# Patient Record
Sex: Male | Born: 1970 | Race: White | Hispanic: No | Smoking: Never smoker
Health system: Southern US, Community
[De-identification: ages and names within clinical notes are randomized; demographics above are authoritative.]

## PROBLEM LIST (undated history)

## (undated) DIAGNOSIS — E785 Hyperlipidemia, unspecified: Secondary | ICD-10-CM

## (undated) DIAGNOSIS — F419 Anxiety disorder, unspecified: Secondary | ICD-10-CM

## (undated) DIAGNOSIS — I10 Essential (primary) hypertension: Secondary | ICD-10-CM

## (undated) DIAGNOSIS — R42 Dizziness and giddiness: Secondary | ICD-10-CM

## (undated) DIAGNOSIS — F32A Depression, unspecified: Secondary | ICD-10-CM

## (undated) DIAGNOSIS — M1990 Unspecified osteoarthritis, unspecified site: Secondary | ICD-10-CM

## (undated) DIAGNOSIS — E119 Type 2 diabetes mellitus without complications: Secondary | ICD-10-CM

## (undated) DIAGNOSIS — G4730 Sleep apnea, unspecified: Secondary | ICD-10-CM

## (undated) HISTORY — DX: Essential (primary) hypertension: I10

## (undated) HISTORY — DX: Depression, unspecified: F32.A

## (undated) HISTORY — PX: GALLBLADDER SURGERY: SHX652

## (undated) HISTORY — DX: Hyperlipidemia, unspecified: E78.5

## (undated) HISTORY — PX: KNEE SURGERY: SHX244

## (undated) HISTORY — DX: Unspecified osteoarthritis, unspecified site: M19.90

## (undated) HISTORY — DX: Sleep apnea, unspecified: G47.30

---

## 2020-08-07 ENCOUNTER — Encounter (HOSPITAL_COMMUNITY): Payer: Self-pay

## 2020-08-07 ENCOUNTER — Other Ambulatory Visit: Payer: Self-pay

## 2020-08-07 ENCOUNTER — Emergency Department (HOSPITAL_COMMUNITY)
Admission: EM | Admit: 2020-08-07 | Discharge: 2020-08-07 | Disposition: A | Discharge status: Home / Self Care | Payer: No Typology Code available for payment source | Attending: Emergency Medicine | Admitting: Emergency Medicine

## 2020-08-07 ENCOUNTER — Emergency Department (HOSPITAL_COMMUNITY): Payer: No Typology Code available for payment source

## 2020-08-07 DIAGNOSIS — Z20822 Contact with and (suspected) exposure to covid-19: Secondary | ICD-10-CM | POA: Diagnosis not present

## 2020-08-07 DIAGNOSIS — K5900 Constipation, unspecified: Secondary | ICD-10-CM

## 2020-08-07 DIAGNOSIS — N289 Disorder of kidney and ureter, unspecified: Secondary | ICD-10-CM | POA: Insufficient documentation

## 2020-08-07 DIAGNOSIS — R1084 Generalized abdominal pain: Secondary | ICD-10-CM

## 2020-08-07 DIAGNOSIS — R739 Hyperglycemia, unspecified: Secondary | ICD-10-CM

## 2020-08-07 DIAGNOSIS — E1165 Type 2 diabetes mellitus with hyperglycemia: Secondary | ICD-10-CM | POA: Diagnosis not present

## 2020-08-07 HISTORY — DX: Anxiety disorder, unspecified: F41.9

## 2020-08-07 HISTORY — DX: Dizziness and giddiness: R42

## 2020-08-07 HISTORY — DX: Type 2 diabetes mellitus without complications: E11.9

## 2020-08-07 LAB — COMPREHENSIVE METABOLIC PANEL
ALT: 23 U/L (ref 0–44)
AST: 14 U/L — ABNORMAL LOW (ref 15–41)
Albumin: 4 g/dL (ref 3.5–5.0)
Alkaline Phosphatase: 74 U/L (ref 38–126)
Anion gap: 9 (ref 5–15)
BUN: 22 mg/dL — ABNORMAL HIGH (ref 6–20)
CO2: 26 mmol/L (ref 22–32)
Calcium: 8.7 mg/dL — ABNORMAL LOW (ref 8.9–10.3)
Chloride: 98 mmol/L (ref 98–111)
Creatinine, Ser: 1.39 mg/dL — ABNORMAL HIGH (ref 0.61–1.24)
GFR, Estimated: >60 mL/min (ref >60)
Glucose, Bld: 401 mg/dL — ABNORMAL HIGH (ref 70–99)
Potassium: 4.8 mmol/L (ref 3.5–5.1)
Sodium: 133 mmol/L — ABNORMAL LOW (ref 135–145)
Total Bilirubin: 0.7 mg/dL (ref 0.3–1.2)
Total Protein: 7 g/dL (ref 6.5–8.1)

## 2020-08-07 LAB — CBC
HCT: 44.7 % (ref 39.0–52.0)
Hemoglobin: 14.8 g/dL (ref 13.0–17.0)
MCH: 28.6 pg (ref 26.0–34.0)
MCHC: 33.1 g/dL (ref 30.0–36.0)
MCV: 86.5 fL (ref 80.0–100.0)
Platelets: 160 10*3/uL (ref 150–400)
RBC: 5.17 MIL/uL (ref 4.22–5.81)
RDW: 12.2 % (ref 11.5–15.5)
WBC: 8.6 10*3/uL (ref 4.0–10.5)
nRBC: 0 % (ref 0.0–0.2)

## 2020-08-07 LAB — URINALYSIS, ROUTINE W REFLEX MICROSCOPIC
Bilirubin Urine: NEGATIVE
Glucose, UA: >=500 mg/dL — AB
Hgb urine dipstick: NEGATIVE
Ketones, ur: 5 mg/dL — AB
Leukocytes,Ua: NEGATIVE
Nitrite: NEGATIVE
Protein, ur: NEGATIVE mg/dL
Specific Gravity, Urine: 1.025 (ref 1.005–1.030)
pH: 7 (ref 5.0–8.0)

## 2020-08-07 LAB — LIPASE, BLOOD: Lipase: 42 U/L (ref 11–51)

## 2020-08-07 LAB — RESP PANEL BY RT-PCR (FLU A&B, COVID) ARPGX2
Influenza A by PCR: NEGATIVE
Influenza B by PCR: NEGATIVE
SARS Coronavirus 2 by RT PCR: NEGATIVE

## 2020-08-07 MED ORDER — FENTANYL CITRATE (PF) 100 MCG/2ML IJ SOLN
100.0000 ug | INTRAMUSCULAR | Status: DC | PRN
  Administered 2020-08-07: 100 ug via INTRAVENOUS
  Filled 2020-08-07: qty 2

## 2020-08-07 MED ORDER — HYDROCODONE-ACETAMINOPHEN 5-325 MG PO TABS: 1.0000 | ORAL_TABLET | ORAL | 0 refills | Status: DC | PRN

## 2020-08-07 MED ORDER — IOHEXOL 300 MG/ML  SOLN
100.0000 mL | Freq: Once | INTRAMUSCULAR | Status: AC | PRN
  Administered 2020-08-07: 100 mL via INTRAVENOUS

## 2020-08-07 MED ORDER — ONDANSETRON HCL 4 MG/2ML IJ SOLN
4.0000 mg | Freq: Once | INTRAMUSCULAR | Status: AC
  Administered 2020-08-07: 08:00:00 4 mg via INTRAVENOUS
  Filled 2020-08-07: qty 2

## 2020-08-07 MED ORDER — HYDROCODONE-ACETAMINOPHEN 5-325 MG PO TABS: 2.0000 | ORAL_TABLET | ORAL | 0 refills | Status: DC | PRN

## 2020-08-07 MED ORDER — SODIUM CHLORIDE 0.9 % IV BOLUS
1000.0000 mL | Freq: Once | INTRAVENOUS | Status: AC
  Administered 2020-08-07: 08:00:00 1000 mL via INTRAVENOUS

## 2020-08-07 NOTE — ED Triage Notes (Signed)
Pt presents to ED with complaints of right upper abdominal pain started at midnight, nausea and vomiting. Pt states he has had an episode like this before and told it was his gallbladder.

## 2020-08-07 NOTE — Discharge Instructions (Addendum)
Your abdominal pain is likely secondary to constipation.  Try to increase amount of fiber in your diet to help improve stooling.  Also, get a bottle of magnesium citrate, and drink it today.  Additionally, use MiraLAX, stool softener, twice a day for 3 weeks.  This can help improve your stooling.  Make sure you are drinking plenty of fluids.  Sometimes soaking in a bathtub helps.  Try to get some regular daily exercise to improve your bowel habits.  The testing showed that your blood sugar was high and your kidney function is not optimal.  You likely need to be treated for diabetes.  Use the attached resource guide to help you find a doctor to see for further care and treatment of diabetes and kidney problems.  This should be done within the next 2 or 3 weeks.

## 2020-08-07 NOTE — ED Provider Notes (Signed)
[facility] EMERGENCY DEPARTMENT Provider Note   CSN: [id number] Arrival date & time: 08/07/20  7124     History Chief Complaint  Patient presents with  . Abdominal Pain    Gary Thompson is a 49 y.o. male.  HPI Reports onset of diffuse abdominal pain yesterday with a cramping sensation, nausea and vomiting.  He denies fever, blood in emesis, constipation or diarrhea.  Similar pain in the past when he had "a gallbladder problem."  He has never had abdominal surgery.  He denies other recent illnesses.  He has not had a Covid vaccine.  There are no other known modifying factors.    Past Medical History:  Diagnosis Date  . Anxiety   . Diabetes mellitus without complication ([place])   . Vertigo     There are no problems to display for this patient.   Past Surgical History:  Procedure Laterality Date  . KNEE SURGERY         No family history on file.  Social History   Tobacco Use  . Smoking status: Never Smoker  . Smokeless tobacco: Never Used  Substance Use Topics  . Alcohol use: Yes    Comment: occ  . Drug use: Not Currently    Home Medications Prior to Admission medications   Medication Sig Start Date End Date Taking? Authorizing Provider  HYDROcodone-acetaminophen (NORCO) 5-325 MG tablet Take 1 tablet by mouth every 4 (four) hours as needed for moderate pain or severe pain. 08/07/20   Daleen Bo, MD    Allergies    Vicks dayquil-nyquil cld & flu [pe-dm-apap & doxylamin-dm-apap]  Review of Systems   Review of Systems  All other systems reviewed and are negative.   Physical Exam Updated Vital Signs BP (!) 172/78   Pulse (!) 57   Temp 97.8 F (36.6 C) (Oral)   Resp 18   Ht 5\' 6"  (1.676 m)   Wt 127 kg   SpO2 98%   BMI 45.19 kg/m   Physical Exam Vitals and nursing note reviewed.  Constitutional:      General: He is in acute distress (Uncomfortable).     Appearance: He is well-developed and well-nourished. He is ill-appearing. He is not  toxic-appearing or diaphoretic.  HENT:     Head: Normocephalic and atraumatic.     Right Ear: External ear normal.     Left Ear: External ear normal.  Eyes:     Extraocular Movements: EOM normal.     Conjunctiva/sclera: Conjunctivae normal.     Pupils: Pupils are equal, round, and reactive to light.  Neck:     Trachea: Phonation normal.  Cardiovascular:     Rate and Rhythm: Normal rate and regular rhythm.     Heart sounds: Normal heart sounds.  Pulmonary:     Effort: Pulmonary effort is normal.     Breath sounds: Normal breath sounds.  Chest:     Chest wall: No bony tenderness.  Abdominal:     General: There is no distension.     Palpations: Abdomen is soft. There is no mass.     Tenderness: There is abdominal tenderness (Diffuse, mild). There is no guarding.     Hernia: No hernia is present.     Comments: Hyperactive bowel sounds  Musculoskeletal:        General: Normal range of motion.     Cervical back: Normal range of motion and neck supple.  Skin:    General: Skin is warm, dry and intact.  Neurological:  Mental Status: He is alert and oriented to person, place, and time.     Cranial Nerves: No cranial nerve deficit.     Sensory: No sensory deficit.     Motor: No abnormal muscle tone.     Coordination: Coordination normal.  Psychiatric:        Mood and Affect: Mood and affect and mood normal.        Behavior: Behavior normal.        Thought Content: Thought content normal.        Judgment: Judgment normal.     ED Results / Procedures / Treatments   Labs (all labs ordered are listed, but only abnormal results are displayed) Labs Reviewed  COMPREHENSIVE METABOLIC PANEL - Abnormal; Notable for the following components:      Result Value   Sodium 133 (*)    Glucose, Bld 401 (*)    BUN 22 (*)    Creatinine, Ser 1.39 (*)    Calcium 8.7 (*)    AST 14 (*)    All other components within normal limits  URINALYSIS, ROUTINE W REFLEX MICROSCOPIC - Abnormal; Notable  for the following components:   Color, Urine STRAW (*)    Glucose, UA >=500 (*)    Ketones, ur 5 (*)    Bacteria, UA RARE (*)    All other components within normal limits  RESP PANEL BY RT-PCR (FLU A&B, COVID) ARPGX2  LIPASE, BLOOD  CBC    EKG None  Radiology CT Abdomen Pelvis W Contrast  Result Date: 08/07/2020 CLINICAL DATA:  Concern for abscess or infection. Nausea and vomiting. Upper abdominal pain. EXAM: CT ABDOMEN AND PELVIS WITH CONTRAST TECHNIQUE: Multidetector CT imaging of the abdomen and pelvis was performed using the standard protocol following bolus administration of intravenous contrast. CONTRAST:  168mL OMNIPAQUE IOHEXOL 300 MG/ML  SOLN COMPARISON:  None. FINDINGS: Lower chest: Lung bases are clear. Hepatobiliary: No focal hepatic lesion. No biliary duct dilatation. Common bile duct is normal. Pancreas: Pancreas is normal. No ductal dilatation. No pancreatic inflammation. Spleen: Normal spleen Adrenals/urinary tract: Adrenal glands and kidneys are normal. The ureters and bladder normal. Stomach/Bowel: Stomach, small bowel, appendix, and cecum are normal. The colon and rectosigmoid colon are normal. Vascular/Lymphatic: Abdominal aorta is normal caliber. No periportal or retroperitoneal adenopathy. No pelvic adenopathy. Reproductive: Prostate unremarkable Other: No free fluid. Fat filled umbilical hernia measures 4.6 cm. (Image 59/2) Musculoskeletal: No aggressive osseous lesion. IMPRESSION: 1. No acute findings in the abdomen pelvis. 2. No abscess or inflammation identified. 3. Moderate size fat filled umbilical hernia Electronically Signed   By: Suzy Bouchard M.D.   On: 08/07/2020 09:46    Procedures Procedures (including critical care time)  Medications Ordered in ED Medications  fentaNYL (SUBLIMAZE) injection 100 mcg (100 mcg Intravenous Given 08/07/20 0813)  sodium chloride 0.9 % bolus 1,000 mL (0 mLs Intravenous Stopped 08/07/20 0845)  ondansetron (ZOFRAN) injection  4 mg (4 mg Intravenous Given 08/07/20 0813)  iohexol (OMNIPAQUE) 300 MG/ML solution 100 mL (100 mLs Intravenous Contrast Given 08/07/20 1950)    ED Course  I have reviewed the triage vital signs and the nursing notes.  Pertinent labs & imaging results that were available during my care of the patient were reviewed by me and considered in my medical decision making (see chart for details).    MDM Rules/Calculators/A&P  Patient Vitals for the past 24 hrs:  BP Temp Temp src Pulse Resp SpO2 Height Weight  08/07/20 1000 (!) 172/78 -- -- (!) 57 18 98 % -- --  08/07/20 0739 (!) 172/72 97.8 F (36.6 C) Oral 70 18 98 % -- --  08/07/20 0735 -- -- -- -- -- -- 5\' 6"  (1.676 m) 127 kg    10:36 AM Reevaluation with update and discussion. After initial assessment and treatment, an updated evaluation reveals patient complains of persistent abdominal pain, now below the umbilicus.  He states that he has been constipated recently.  He does not currently take a stool softener.  Discussed findings with the patient, and wife in the room, all questions were answered. Daleen Bo   Medical Decision Making:  This patient is presenting for evaluation of abdominal pain, which does require a range of treatment options, and is a complaint that involves a high risk of morbidity and mortality. The differential diagnoses include intestinal disorder, gallbladder disorder, acute illness, metabolic disorder. I decided to review old records, and in summary obese middle-aged male presenting with abdominal pain, acute in onset.  I did not require additional historical information from anyone.  Clinical Laboratory Tests Ordered, included CBC, Metabolic panel, Urinalysis and Covid test. Review indicates high blood sugar, mild renal insufficiency, remainder normal. Radiologic Tests Ordered, included CT abdomen pelvis.  I independently Visualized: CT images, which show no acute  abnormality    Critical Interventions-clinical evaluation, laboratory testing, CT imaging, observation and reassessment  After These Interventions, the Patient was reevaluated and was found stable for discharge.  No acute intra-abdominal abnormalities.  Incidental hyperglycemia, and mild renal insufficiency.  Patient appears to have untreated diabetes.  No metabolic crisis, or suspected acute infectious process.  Hemodynamically stable.  He has mild hypertension, which may be secondary to his pain.  He will be referred to PCP for ongoing management.  CRITICAL CARE-no Performed by: Daleen Bo  Nursing Notes Reviewed/ Care Coordinated Applicable Imaging Reviewed Interpretation of Laboratory Data incorporated into ED treatment  The patient appears reasonably screened and/or stabilized for discharge and I doubt any other medical condition or other [facility] requiring further screening, evaluation, or treatment in the ED at this time prior to discharge.  Plan: Home Medications-OTC stool softener; Home Treatments-fluids, fiber, low-carb diet; return here if the recommended treatment, does not improve the symptoms; Recommended follow up-PCP follow-up 2 to 3 weeks for ongoing management of multiple problems.     Final Clinical Impression(s) / ED Diagnoses Final diagnoses:  Generalized abdominal pain  Constipation, unspecified constipation type  Hyperglycemia  Renal insufficiency    Rx / DC Orders ED Discharge Orders         Ordered    HYDROcodone-acetaminophen (NORCO/VICODIN) 5-325 MG tablet  Every 4 hours PRN,   Status:  Discontinued        08/07/20 1038    HYDROcodone-acetaminophen (NORCO/VICODIN) 5-325 MG tablet  Every 4 hours PRN,   Status:  Discontinued        08/07/20 1039    HYDROcodone-acetaminophen (NORCO/VICODIN) 5-325 MG tablet  Every 4 hours PRN,   Status:  Discontinued        08/07/20 1041    HYDROcodone-acetaminophen (NORCO) 5-325 MG tablet  Every 4 hours PRN         08/07/20 1043           Daleen Bo, MD 08/07/20 1049

## 2020-08-07 NOTE — ED Notes (Signed)
Pt back from CT

## 2020-08-10 ENCOUNTER — Emergency Department (HOSPITAL_COMMUNITY): Payer: No Typology Code available for payment source

## 2020-08-10 ENCOUNTER — Encounter (HOSPITAL_COMMUNITY): Payer: Self-pay | Admitting: Emergency Medicine

## 2020-08-10 ENCOUNTER — Observation Stay (HOSPITAL_COMMUNITY)
Admission: EM | Admit: 2020-08-10 | Discharge: 2020-08-12 | Disposition: A | Discharge status: Home / Self Care | Payer: No Typology Code available for payment source | Attending: General Surgery | Admitting: General Surgery

## 2020-08-10 ENCOUNTER — Other Ambulatory Visit: Payer: Self-pay

## 2020-08-10 DIAGNOSIS — R109 Unspecified abdominal pain: Secondary | ICD-10-CM | POA: Diagnosis present

## 2020-08-10 DIAGNOSIS — Z01818 Encounter for other preprocedural examination: Secondary | ICD-10-CM

## 2020-08-10 DIAGNOSIS — D72829 Elevated white blood cell count, unspecified: Secondary | ICD-10-CM

## 2020-08-10 DIAGNOSIS — E119 Type 2 diabetes mellitus without complications: Secondary | ICD-10-CM | POA: Diagnosis not present

## 2020-08-10 DIAGNOSIS — Z20822 Contact with and (suspected) exposure to covid-19: Secondary | ICD-10-CM | POA: Diagnosis not present

## 2020-08-10 DIAGNOSIS — K810 Acute cholecystitis: Secondary | ICD-10-CM | POA: Diagnosis present

## 2020-08-10 DIAGNOSIS — K819 Cholecystitis, unspecified: Secondary | ICD-10-CM | POA: Insufficient documentation

## 2020-08-10 DIAGNOSIS — E871 Hypo-osmolality and hyponatremia: Secondary | ICD-10-CM

## 2020-08-10 DIAGNOSIS — N289 Disorder of kidney and ureter, unspecified: Secondary | ICD-10-CM

## 2020-08-10 LAB — URINALYSIS, ROUTINE W REFLEX MICROSCOPIC
Bacteria, UA: NONE SEEN
Bilirubin Urine: NEGATIVE
Glucose, UA: >=500 mg/dL — AB
Ketones, ur: 20 mg/dL — AB
Leukocytes,Ua: NEGATIVE
Nitrite: NEGATIVE
Protein, ur: >=300 mg/dL — AB
Specific Gravity, Urine: 1.035 — ABNORMAL HIGH (ref 1.005–1.030)
pH: 5 (ref 5.0–8.0)

## 2020-08-10 LAB — CBC
HCT: 48 % (ref 39.0–52.0)
Hemoglobin: 15.1 g/dL (ref 13.0–17.0)
MCH: 27.8 pg (ref 26.0–34.0)
MCHC: 31.5 g/dL (ref 30.0–36.0)
MCV: 88.2 fL (ref 80.0–100.0)
Platelets: 176 10*3/uL (ref 150–400)
RBC: 5.44 MIL/uL (ref 4.22–5.81)
RDW: 12.1 % (ref 11.5–15.5)
WBC: 13.6 10*3/uL — ABNORMAL HIGH (ref 4.0–10.5)
nRBC: 0 % (ref 0.0–0.2)

## 2020-08-10 LAB — COMPREHENSIVE METABOLIC PANEL
ALT: 21 U/L (ref 0–44)
AST: 14 U/L — ABNORMAL LOW (ref 15–41)
Albumin: 3.2 g/dL — ABNORMAL LOW (ref 3.5–5.0)
Alkaline Phosphatase: 112 U/L (ref 38–126)
Anion gap: 10 (ref 5–15)
BUN: 21 mg/dL — ABNORMAL HIGH (ref 6–20)
CO2: 29 mmol/L (ref 22–32)
Calcium: 8.4 mg/dL — ABNORMAL LOW (ref 8.9–10.3)
Chloride: 94 mmol/L — ABNORMAL LOW (ref 98–111)
Creatinine, Ser: 1.25 mg/dL — ABNORMAL HIGH (ref 0.61–1.24)
GFR, Estimated: >60 mL/min (ref >60)
Glucose, Bld: 293 mg/dL — ABNORMAL HIGH (ref 70–99)
Potassium: 4 mmol/L (ref 3.5–5.1)
Sodium: 133 mmol/L — ABNORMAL LOW (ref 135–145)
Total Bilirubin: 1.2 mg/dL (ref 0.3–1.2)
Total Protein: 7.4 g/dL (ref 6.5–8.1)

## 2020-08-10 LAB — RESP PANEL BY RT-PCR (FLU A&B, COVID) ARPGX2
Influenza A by PCR: NEGATIVE
Influenza B by PCR: NEGATIVE
SARS Coronavirus 2 by RT PCR: NEGATIVE

## 2020-08-10 LAB — CBG MONITORING, ED
Glucose-Capillary: 271 mg/dL — ABNORMAL HIGH (ref 70–99)
Glucose-Capillary: 211 mg/dL — ABNORMAL HIGH (ref 70–99)
Glucose-Capillary: 219 mg/dL — ABNORMAL HIGH (ref 70–99)

## 2020-08-10 LAB — HEMOGLOBIN A1C
Hgb A1c MFr Bld: 9.8 % — ABNORMAL HIGH (ref 4.8–5.6)
Mean Plasma Glucose: 234.56 mg/dL

## 2020-08-10 LAB — LIPASE, BLOOD: Lipase: 36 U/L (ref 11–51)

## 2020-08-10 LAB — HIV ANTIBODY (ROUTINE TESTING W REFLEX): HIV Screen 4th Generation wRfx: NONREACTIVE

## 2020-08-10 MED ORDER — OXYCODONE HCL 5 MG PO TABS
5.0000 mg | ORAL_TABLET | ORAL | Status: DC | PRN
Start: 2020-08-10 — End: 2020-08-12
  Administered 2020-08-11 – 2020-08-12 (×2): 10 mg via ORAL
  Filled 2020-08-10 (×2): qty 2

## 2020-08-10 MED ORDER — METOPROLOL TARTRATE 5 MG/5ML IV SOLN: 5.0000 mg | Freq: Four times a day (QID) | INTRAVENOUS | Status: DC | PRN

## 2020-08-10 MED ORDER — CLONAZEPAM 0.5 MG PO TABS
1.0000 mg | ORAL_TABLET | Freq: Two times a day (BID) | ORAL | Status: DC | PRN
  Administered 2020-08-10: 16:00:00 1 mg via ORAL
  Filled 2020-08-10: qty 2

## 2020-08-10 MED ORDER — SODIUM CHLORIDE 0.9 % IV SOLN
2.0000 g | INTRAVENOUS | Status: AC
  Administered 2020-08-11: 13:00:00 2 g via INTRAVENOUS
  Filled 2020-08-10: qty 2

## 2020-08-10 MED ORDER — SIMETHICONE 80 MG PO CHEW: 40.0000 mg | CHEWABLE_TABLET | Freq: Four times a day (QID) | ORAL | Status: DC | PRN

## 2020-08-10 MED ORDER — CHLORHEXIDINE GLUCONATE CLOTH 2 % EX PADS: 6.0000 | MEDICATED_PAD | Freq: Once | CUTANEOUS | Status: DC

## 2020-08-10 MED ORDER — DIPHENHYDRAMINE HCL 12.5 MG/5ML PO ELIX: 12.5000 mg | ORAL_SOLUTION | Freq: Four times a day (QID) | ORAL | Status: DC | PRN

## 2020-08-10 MED ORDER — PANTOPRAZOLE SODIUM 40 MG IV SOLR
40.0000 mg | Freq: Every day | INTRAVENOUS | Status: DC
  Administered 2020-08-10 – 2020-08-11 (×2): 40 mg via INTRAVENOUS
  Filled 2020-08-10 (×3): qty 40

## 2020-08-10 MED ORDER — ONDANSETRON HCL 4 MG/2ML IJ SOLN
4.0000 mg | Freq: Four times a day (QID) | INTRAMUSCULAR | Status: DC | PRN
  Administered 2020-08-10: 21:00:00 4 mg via INTRAVENOUS
  Filled 2020-08-10: qty 2

## 2020-08-10 MED ORDER — HEPARIN SODIUM (PORCINE) 5000 UNIT/ML IJ SOLN
5000.0000 [IU] | Freq: Three times a day (TID) | INTRAMUSCULAR | Status: DC
  Administered 2020-08-10 – 2020-08-12 (×5): 5000 [IU] via SUBCUTANEOUS
  Filled 2020-08-10 (×5): qty 1

## 2020-08-10 MED ORDER — ONDANSETRON HCL 4 MG/2ML IJ SOLN
4.0000 mg | Freq: Once | INTRAMUSCULAR | Status: AC
  Administered 2020-08-10: 14:00:00 4 mg via INTRAVENOUS
  Filled 2020-08-10: qty 2

## 2020-08-10 MED ORDER — DIPHENHYDRAMINE HCL 50 MG/ML IJ SOLN: 12.5000 mg | Freq: Four times a day (QID) | INTRAMUSCULAR | Status: DC | PRN

## 2020-08-10 MED ORDER — INSULIN ASPART 100 UNIT/ML ~~LOC~~ SOLN
0.0000 [IU] | SUBCUTANEOUS | Status: DC
  Administered 2020-08-11: 20:00:00 11 [IU] via SUBCUTANEOUS
  Administered 2020-08-12: 09:00:00 4 [IU] via SUBCUTANEOUS
  Administered 2020-08-12: 05:00:00 11 [IU] via SUBCUTANEOUS
  Administered 2020-08-12: 13:00:00 7 [IU] via SUBCUTANEOUS
  Administered 2020-08-12: 11 [IU] via SUBCUTANEOUS
  Filled 2020-08-10: qty 1

## 2020-08-10 MED ORDER — FAMOTIDINE 20 MG PO TABS
20.0000 mg | ORAL_TABLET | Freq: Once | ORAL | Status: AC
  Administered 2020-08-10: 14:00:00 20 mg via ORAL
  Filled 2020-08-10: qty 1

## 2020-08-10 MED ORDER — LACTATED RINGERS IV SOLN: INTRAVENOUS | Status: DC

## 2020-08-10 MED ORDER — SODIUM CHLORIDE 0.9 % IV SOLN
2.0000 g | Freq: Once | INTRAVENOUS | Status: AC
  Administered 2020-08-10: 15:00:00 2 g via INTRAVENOUS
  Filled 2020-08-10: qty 20

## 2020-08-10 MED ORDER — MORPHINE SULFATE (PF) 2 MG/ML IV SOLN
2.0000 mg | INTRAVENOUS | Status: DC | PRN
Start: 2020-08-10 — End: 2020-08-12
  Administered 2020-08-11 – 2020-08-12 (×3): 2 mg via INTRAVENOUS
  Filled 2020-08-10 (×3): qty 1

## 2020-08-10 MED ORDER — ONDANSETRON 4 MG PO TBDP: 4.0000 mg | ORAL_TABLET | Freq: Four times a day (QID) | ORAL | Status: DC | PRN

## 2020-08-10 MED ORDER — DOCUSATE SODIUM 100 MG PO CAPS
100.0000 mg | ORAL_CAPSULE | Freq: Two times a day (BID) | ORAL | Status: DC
  Administered 2020-08-10 – 2020-08-12 (×4): 100 mg via ORAL
  Filled 2020-08-10 (×4): qty 1

## 2020-08-10 MED ORDER — SODIUM CHLORIDE 0.9 % IV BOLUS
1000.0000 mL | Freq: Once | INTRAVENOUS | Status: AC
  Administered 2020-08-10: 14:00:00 1000 mL via INTRAVENOUS

## 2020-08-10 NOTE — ED Provider Notes (Signed)
[facility] EMERGENCY DEPARTMENT Provider Note   CSN: [id number] Arrival date & time: 08/10/20  1232     History Chief Complaint  Patient presents with  . Abdominal Pain    Gary Thompson is a 49 y.o. male.  Patient with history of diabetes, anxiety presents with worsening abdominal pain and nausea since the 12th.  Patient was seen and had blood work and CT scan was sent home with plan to treat constipation.  Patient's had worsening symptoms since.  Patient not able to tolerate very much oral fluids.        Past Medical History:  Diagnosis Date  . Anxiety   . Diabetes mellitus without complication ([place])   . Vertigo     There are no problems to display for this patient.   Past Surgical History:  Procedure Laterality Date  . KNEE SURGERY         History reviewed. No pertinent family history.  Social History   Tobacco Use  . Smoking status: Never Smoker  . Smokeless tobacco: Never Used  Vaping Use  . Vaping Use: Never used  Substance Use Topics  . Alcohol use: Yes    Comment: occ  . Drug use: Not Currently    Home Medications Prior to Admission medications   Medication Sig Start Date End Date Taking? Authorizing Provider  HYDROcodone-acetaminophen (NORCO) 5-325 MG tablet Take 1 tablet by mouth every 4 (four) hours as needed for moderate pain or severe pain. 08/07/20   Daleen Bo, MD    Allergies    Vicks dayquil-nyquil cld & flu [pe-dm-apap & doxylamin-dm-apap]  Review of Systems   Review of Systems  Constitutional: Positive for appetite change. Negative for chills and fever.  HENT: Negative for congestion.   Eyes: Negative for visual disturbance.  Respiratory: Negative for shortness of breath.   Cardiovascular: Negative for chest pain.  Gastrointestinal: Positive for abdominal pain, nausea and vomiting.  Genitourinary: Negative for dysuria and flank pain.  Musculoskeletal: Negative for back pain, neck pain and neck stiffness.  Skin:  Negative for rash.  Neurological: Negative for light-headedness and headaches.    Physical Exam Updated Vital Signs BP 133/84   Pulse 85   Temp 98.9 F (37.2 C) (Oral)   Resp (!) 22   Ht 5\' 6"  (1.676 m)   Wt 124.3 kg   SpO2 95%   BMI 44.22 kg/m   Physical Exam Vitals and nursing note reviewed.  Constitutional:      Appearance: He is well-developed and well-nourished.  HENT:     Head: Normocephalic and atraumatic.     Comments: Dry mm Eyes:     General:        Right eye: No discharge.        Left eye: No discharge.     Conjunctiva/sclera: Conjunctivae normal.  Neck:     Trachea: No tracheal deviation.  Cardiovascular:     Rate and Rhythm: Normal rate and regular rhythm.  Pulmonary:     Effort: Pulmonary effort is normal.     Breath sounds: Normal breath sounds.  Abdominal:     General: There is no distension.     Palpations: Abdomen is soft.     Tenderness: There is abdominal tenderness in the right upper quadrant and epigastric area. There is no guarding.  Musculoskeletal:        General: No edema.     Cervical back: Normal range of motion and neck supple.  Skin:    General: Skin is  warm.     Capillary Refill: Capillary refill takes less than 2 seconds.     Findings: No rash.  Neurological:     Mental Status: He is alert and oriented to person, place, and time.  Psychiatric:        Mood and Affect: Mood and affect and mood normal.     ED Results / Procedures / Treatments   Labs (all labs ordered are listed, but only abnormal results are displayed) Labs Reviewed  COMPREHENSIVE METABOLIC PANEL - Abnormal; Notable for the following components:      Result Value   Sodium 133 (*)    Chloride 94 (*)    Glucose, Bld 293 (*)    BUN 21 (*)    Creatinine, Ser 1.25 (*)    Calcium 8.4 (*)    Albumin 3.2 (*)    AST 14 (*)    All other components within normal limits  CBC - Abnormal; Notable for the following components:   WBC 13.6 (*)    All other components  within normal limits  URINALYSIS, ROUTINE W REFLEX MICROSCOPIC - Abnormal; Notable for the following components:   Color, Urine AMBER (*)    APPearance HAZY (*)    Specific Gravity, Urine 1.035 (*)    Glucose, UA >=500 (*)    Hgb urine dipstick MODERATE (*)    Ketones, ur 20 (*)    Protein, ur >=300 (*)    All other components within normal limits  CBG MONITORING, ED - Abnormal; Notable for the following components:   Glucose-Capillary 271 (*)    All other components within normal limits  RESP PANEL BY RT-PCR (FLU A&B, COVID) ARPGX2  LIPASE, BLOOD    EKG None  Radiology US Abdomen Limited  Result Date: 08/10/2020 CLINICAL DATA:  Upper abdominal pain. EXAM: ULTRASOUND ABDOMEN LIMITED RIGHT UPPER QUADRANT COMPARISON:  08/07/2020. FINDINGS: Gallbladder: Mild gallbladder wall thickening. Intraluminal calculi measuring up to 1 cm. Sonographic Percell Miller sign was noted by sonographer. Trace pericholecystic free fluid. Common bile duct: Diameter: 5 mm Liver: No focal lesion identified. Increased parenchymal echogenicity. Portal vein is patent on color Doppler imaging with normal direction of blood flow towards the liver. Other: None. IMPRESSION: Cholelithiasis with mild gallbladder wall thickening, trace pericholecystic free fluid and positive sonographic Murphy sign. Collectively these findings are concerning for acute cholecystitis. Hepatic steatosis.  No focal hepatic lesion. Electronically Signed   By: Primitivo Gauze M.D.   On: 08/10/2020 14:44    Procedures Procedures (including critical care time)  Medications Ordered in ED Medications  cefTRIAXone (ROCEPHIN) 2 g in sodium chloride 0.9 % 100 mL IVPB (has no administration in time range)  famotidine (PEPCID) tablet 20 mg (20 mg Oral Given 08/10/20 1406)  sodium chloride 0.9 % bolus 1,000 mL (1,000 mLs Intravenous New Bag/Given 08/10/20 1406)  ondansetron (ZOFRAN) injection 4 mg (4 mg Intravenous Given 08/10/20 1406)    ED Course   I have reviewed the triage vital signs and the nursing notes.  Pertinent labs & imaging results that were available during my care of the patient were reviewed by me and considered in my medical decision making (see chart for details).    MDM Rules/Calculators/A&P                          Patient presents with repeat visit for worsening upper abdominal pain and nausea.  Discussed differential diagnosis including biliary disease/stones/cholecystitis, hepatitis, reflux, ulcers, colitis, other. Blood work  ordered and reviewed showing mild leukocytosis 13,000, normal LFTs, normal hemoglobin, mild elevated kidney function.  IV fluids given.  Urinalysis showed ketones and glucose. Ultrasound results reviewed with radiologist and myself showing acute cholecystitis.  Discussed with Dr. Constance Haw plan for admission and surgery.  Rocephin ordered IV.  Pepcid ordered to help with symptom management.  COVID neg.    Final Clinical Impression(s) / ED Diagnoses Final diagnoses:  Acute cholecystitis  Acute renal insufficiency  Leukocytosis, unspecified type  Hyponatremia    Rx / DC Orders ED Discharge Orders    None       Elnora Morrison, MD 08/10/20 1505

## 2020-08-10 NOTE — ED Notes (Signed)
Dr. Bridges at bedside 

## 2020-08-10 NOTE — ED Notes (Signed)
Family at bedside. 

## 2020-08-10 NOTE — H&P (Addendum)
[facility] History and Physical  Reason for Referral: Acute cholecystitis  Referring Physician:  Dr. Reather Converse   Chief Complaint    Abdominal Pain      Gary Thompson is a 49 y.o. male.  HPI:  Mr .Thompson is a 49 yo with DM and anxiety who presented to ED 12/12 with epigastric/ RUQ pain and nausea and reflux. He has pain whenever he is eating and says that he has had trouble like this prior and it was thought to be his gallbladder. He was going to get the gallbladder removed but his anxiety prevented him from having this done. He has had knee surgery in the past and had a lot anxiety about that and had back pain after due to laying on the bed. He says that he is very anxious about any surgery.  He has been worked up in the past for chest pain related to his anxiety and just moved from [place] area. He has been seen at the [facility] clinic and says he had multiple test and treadmill workup all of which were negative and everything was attributed to his anxiety.  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 He denies any chest pain or SOB with these activities just when he gets anxious.   Past Medical History:  Diagnosis Date  . Anxiety   . Diabetes mellitus without complication ([place])   . Vertigo     Past Surgical History:  Procedure Laterality Date  . KNEE SURGERY      Family History  Problem Relation Age of Onset  . Hypertension Mother   . Diabetes Mother   . Hypertension Father   . Diabetes Father     Social History   Tobacco Use  . Smoking status: Never Smoker  . Smokeless tobacco: Never Used  Vaping Use  . Vaping Use: Never used  Substance Use Topics  . Alcohol use: Yes    Comment: occ  . Drug use: Not Currently    Medications:  Current Facility-Administered Medications  Medication Dose Route Frequency Provider Last Rate Last Admin  . [START ON 08/11/2020] cefoTEtan (CEFOTAN) 2 g in sodium chloride 0.9 % 100 mL  IVPB  2 g Intravenous On Call to [place], MD      . Chlorhexidine Gluconate Cloth 2 % PADS 6 each  6 each Topical Once Virl Cagey, MD       And  . Chlorhexidine Gluconate Cloth 2 % PADS 6 each  6 each Topical Once Virl Cagey, MD      . clonazePAM [facility]) tablet 1 mg  1 mg Oral BID PRN Virl Cagey, MD      . diphenhydrAMINE (BENADRYL) 12.5 MG/5ML elixir 12.5 mg  12.5 mg Oral Q6H PRN Virl Cagey, MD       Or  . diphenhydrAMINE (BENADRYL) injection 12.5 mg  12.5 mg Intravenous Q6H PRN Virl Cagey, MD      . docusate sodium (COLACE) capsule 100 mg  100 mg Oral BID Virl Cagey, MD      . heparin injection 5,000 Units  5,000 Units Subcutaneous Q8H Virl Cagey, MD      . insulin aspart (novoLOG) injection 0-20 Units  0-20 Units Subcutaneous Q4H Virl Cagey, MD      . lactated ringers infusion   Intravenous Continuous Virl Cagey, MD      . metoprolol tartrate (LOPRESSOR) injection 5 mg  5 mg Intravenous Q6H PRN Virl Cagey, MD      . morphine 2 MG/ML injection 2 mg  2 mg Intravenous Q3H PRN Virl Cagey, MD      . ondansetron (ZOFRAN-ODT) disintegrating tablet 4 mg  4 mg Oral Q6H PRN Virl Cagey, MD       Or  . ondansetron [facility]) injection 4 mg  4 mg Intravenous Q6H PRN Virl Cagey, MD      . oxyCODONE (Oxy IR/ROXICODONE) immediate release tablet 5-10 mg  5-10 mg Oral Q4H PRN Virl Cagey, MD      . pantoprazole (PROTONIX) injection 40 mg  40 mg Intravenous QHS Virl Cagey, MD      . simethicone [facility]) chewable tablet 40 mg  40 mg Oral Q6H PRN Virl Cagey, MD       Current Outpatient Medications  Medication Sig Dispense Refill Last Dose  . aspirin-sod bicarb-citric acid (ALKA-SELTZER) 325 MG TBEF tablet Take 325 mg by mouth every 6 (six) hours as needed.   08/10/2020 at Unknown time  . clonazePAM (KLONOPIN) 1 MG tablet Take 1 tablet by mouth daily as needed.   Past  Week at Unknown time  . docusate sodium (COLACE) 100 MG capsule Take 100 mg by mouth 2 (two) times daily.   08/09/2020 at Unknown time  . glipiZIDE (GLUCOTROL) 10 MG tablet Take 1 tablet by mouth 2 (two) times daily.   Past Week at Unknown time  . HYDROcodone-acetaminophen (NORCO) 5-325 MG tablet Take 1 tablet by mouth every 4 (four) hours as needed for moderate pain or severe pain. 12 tablet 0 08/09/2020 at Unknown time  . omeprazole (PRILOSEC) 20 MG capsule Take 1 tablet by mouth daily.   08/09/2020 at Unknown time   Allergies  Allergen Reactions  . Vicks Dayquil-Nyquil Cld & Flu [Pe-Dm-Apap & Doxylamin-Dm-Apap]      ROS:  A comprehensive review of systems was negative except for: Gastrointestinal: positive for abdominal pain, nausea, reflux symptoms and vomiting Behavioral/Psych: positive for anxiety  Blood pressure 131/77, pulse 84, temperature 98.9 F (37.2 C), temperature source Oral, resp. rate (!) 23, height 5\' 6"  (1.676 m), weight 124.3 kg, SpO2 97 %. Physical Exam Vitals reviewed.  Constitutional:      Appearance: He is obese.  HENT:     Head: Normocephalic.  Cardiovascular:     Rate and Rhythm: Normal rate and regular rhythm.  Pulmonary:     Effort: Pulmonary effort is normal.     Breath sounds: Normal breath sounds.  Abdominal:     Palpations: Abdomen is soft.     Tenderness: There is abdominal tenderness in the right upper quadrant and epigastric area.     Hernia: A hernia is present. Hernia is present in the umbilical area.     Comments: Non reducible hernia at umbilicus  Skin:    General: Skin is warm.  Neurological:     General: No focal deficit present.     Mental Status: He is alert and oriented to person, place, and time.  Psychiatric:        Mood and Affect: Mood is anxious.        Behavior: Behavior normal.     Results: Results for orders placed or performed during the hospital encounter of 08/10/20 (from the past 48 hour(s))  CBG monitoring, ED      Status: Abnormal   Collection Time: 08/10/20 12:49 PM  Result Value Ref Range   Glucose-Capillary 271 (  H) 70 - 99 mg/dL    Comment: Glucose reference range applies only to samples taken after fasting for at least 8 hours.  Lipase, blood     Status: None   Collection Time: 08/10/20 12:55 PM  Result Value Ref Range   Lipase 36 11 - 51 U/L    Comment: Performed at [facility], [address], [place], [place] [address]  Comprehensive metabolic panel     Status: Abnormal   Collection Time: 08/10/20 12:55 PM  Result Value Ref Range   Sodium 133 (L) 135 - 145 mmol/L   Potassium 4.0 3.5 - 5.1 mmol/L   Chloride 94 (L) 98 - 111 mmol/L   CO2 29 22 - 32 mmol/L   Glucose, Bld 293 (H) 70 - 99 mg/dL    Comment: Glucose reference range applies only to samples taken after fasting for at least 8 hours.   BUN 21 (H) 6 - 20 mg/dL   Creatinine, Ser 1.25 (H) 0.61 - 1.24 mg/dL   Calcium 8.4 (L) 8.9 - 10.3 mg/dL   Total Protein 7.4 6.5 - 8.1 g/dL   Albumin 3.2 (L) 3.5 - 5.0 g/dL   AST 14 (L) 15 - 41 U/L   ALT 21 0 - 44 U/L   Alkaline Phosphatase 112 38 - 126 U/L   Total Bilirubin 1.2 0.3 - 1.2 mg/dL   GFR, Estimated >60 >60 mL/min    Comment: (NOTE) Calculated using the CKD-EPI Creatinine Equation (2021)    Anion gap 10 5 - 15    Comment: Performed at [facility], [address], [place], [place] [address]  CBC     Status: Abnormal   Collection Time: 08/10/20 12:55 PM  Result Value Ref Range   WBC 13.6 (H) 4.0 - 10.5 K/uL   RBC 5.44 4.22 - 5.81 MIL/uL   Hemoglobin 15.1 13.0 - 17.0 g/dL   HCT 48.0 39.0 - 52.0 %   MCV 88.2 80.0 - 100.0 fL   MCH 27.8 26.0 - 34.0 pg   MCHC 31.5 30.0 - 36.0 g/dL   RDW 12.1 11.5 - 15.5 %   Platelets 176 150 - 400 K/uL   nRBC 0.0 0.0 - 0.2 %    Comment: Performed at [facility], [address], [place], [place] [address]  Urinalysis, Routine w reflex microscopic Urine, Clean Catch     Status: Abnormal   Collection Time: 08/10/20  1:47 PM  Result Value Ref Range    Color, Urine AMBER (A) YELLOW    Comment: BIOCHEMICALS MAY BE AFFECTED BY COLOR   APPearance HAZY (A) CLEAR   Specific Gravity, Urine 1.035 (H) 1.005 - 1.030   pH 5.0 5.0 - 8.0   Glucose, UA >=500 (A) NEGATIVE mg/dL   Hgb urine dipstick MODERATE (A) NEGATIVE   Bilirubin Urine NEGATIVE NEGATIVE   Ketones, ur 20 (A) NEGATIVE mg/dL   Protein, ur >=300 (A) NEGATIVE mg/dL   Nitrite NEGATIVE NEGATIVE   Leukocytes,Ua NEGATIVE NEGATIVE   RBC / HPF 21-50 0 - 5 RBC/hpf   WBC, UA 0-5 0 - 5 WBC/hpf   Bacteria, UA NONE SEEN NONE SEEN   Squamous Epithelial / LPF 0-5 0 - 5   Mucus PRESENT     Comment: Performed at [facility], [address], [place], [place] [address]   Personally reviewed CT 12/12 -distended gallbladder with stones and reviewed [place] today, distended gallbladder with stone and edema  US Abdomen Limited  Result Date: 08/10/2020 CLINICAL DATA:  Upper abdominal  pain. EXAM: ULTRASOUND ABDOMEN LIMITED RIGHT UPPER QUADRANT COMPARISON:  08/07/2020. FINDINGS: Gallbladder: Mild gallbladder wall thickening. Intraluminal calculi measuring up to 1 cm. Sonographic Percell Miller sign was noted by sonographer. Trace pericholecystic free fluid. Common bile duct: Diameter: 5 mm Liver: No focal lesion identified. Increased parenchymal echogenicity. Portal vein is patent on color Doppler imaging with normal direction of blood flow towards the liver. Other: None. IMPRESSION: Cholelithiasis with mild gallbladder wall thickening, trace pericholecystic free fluid and positive sonographic Murphy sign. Collectively these findings are concerning for acute cholecystitis. Hepatic steatosis.  No focal hepatic lesion. Electronically Signed   By: Primitivo Gauze M.D.   On: 08/10/2020 14:44   DG Chest Port 1 View  Result Date: 08/10/2020 CLINICAL DATA:  49 year old male with preop chest radiograph. EXAM: PORTABLE CHEST 1 VIEW COMPARISON:  None. FINDINGS: Minimal image tray shin of the right hemidiaphragm with minimal  right lung base atelectasis. No focal consolidation, pleural effusion or pneumothorax. Top-normal cardiac size. No acute osseous pathology. Degenerative changes of the spine. IMPRESSION: No active disease. Electronically Signed   By: Anner Crete M.D.   On: 08/10/2020 15:23     Assessment & Plan:  Bruin Bolger is a 49 y.o. male with acute cholecystitis on [place] today. Leukocytosis but LFTs looking ok. Having pain. He is anxious about surgery and has anxiety at baseline. He is contemplating not even having surgery.   -PLAN: I counseled the patient about the indication, risks and benefits of laparoscopic cholecystectomy.  He understands there is a very small chance for bleeding, infection, injury to normal structures (including common bile duct), conversion to open surgery, persistent symptoms, evolution of postcholecystectomy diarrhea, need for secondary interventions, anesthesia reaction, cardiopulmonary issues and other risks not specifically detailed here. I described the expected recovery, the plan for follow-up and the restrictions during the recovery phase.  All questions were answered.  -PRN For pain -Discussed COVID test pending  -Discussed labs in AM -Clears now NPO midnight, ok to have meds -Clonopin from home ordered -SSI for now  -Will need referral to a PCP in town as he just moved here  -Antibiotics for cholecystitis  -Creatinine elevated? Unsure if this is baseline / chronic disease  -SCDs, Heparin sq -Will plan for umbilical hernia repair primarily after trocar placement through the area, discussed risk of recurrence due to no mesh use   All questions were answered to the satisfaction of the patient and family.     Virl Cagey 08/10/2020, 3:40 PM

## 2020-08-10 NOTE — TOC Initial Note (Signed)
Transition of Care [facility]) - Initial/Assessment Note    Patient Details  Name: Gary Thompson MRN: [id number] Date of Birth: Oct 22, 1970  Transition of Care [facility]) CM/SW Contact:    Iona Beard, [place] Phone Number: 08/10/2020, 4:37 PM  Clinical Narrative:                 Pt admitted for acute cholecystitis. TOC received consult due to pt having no insurance or PCP. Per consult pt will need f/u with PCP after surgery. CSW visited pt in ED to complete assessment. Pt states that he lives with his girlfriend. Pt is independent with ADLs and drives himself. Pt has not used [place] services nor does he use DME. Pt states that he does have insurance, it is being expedited right now. Insurance will be through [facility]. CSW educated pt on importance of getting established with PCP and f/u after surgery. CSW provided pt with local PCP list. TOC to follow.   Expected Discharge Plan: Home/Self Care Barriers to Discharge: Continued Medical Work up,ED PCP establishment   Patient Goals and CMS Choice Patient states their goals for this hospitalization and ongoing recovery are:: Return home   Choice offered to / list presented to : NA  Expected Discharge Plan and Services Expected Discharge Plan: Home/Self Care In-house Referral: Clinical Social Work Discharge Planning Services: CM Consult Post Acute Care Choice: NA Living arrangements for the past 2 months: Single Family Home                 DME Arranged: N/A DME Agency: NA       HH Arranged: NA HH Agency: NA        Prior Living Arrangements/Services Living arrangements for the past 2 months: Single Family Home Lives with:: Significant Other Patient language and need for interpreter reviewed:: Yes Do you feel safe going back to the place where you live?: Yes            Criminal Activity/Legal Involvement Pertinent to Current Situation/Hospitalization: No - Comment as needed  Activities of Daily Living      Permission  Sought/Granted                  Emotional Assessment Appearance:: Appears stated age Attitude/Demeanor/Rapport: Engaged Affect (typically observed): Accepting Orientation: : Oriented to Self,Oriented to Place,Oriented to  Time,Oriented to Situation Alcohol / Substance Use: Not Applicable Psych Involvement: No (comment)  Admission diagnosis:  Acute cholecystitis [K81.0] Patient Active Problem List   Diagnosis Date Noted   Acute cholecystitis 08/10/2020   PCP:  Merryl Hacker No Pharmacy:   [facility] [place] - [address] [place] [address] Phone: [phone] Fax: [phone]     Social Determinants of Health (SDOH) Interventions    Readmission Risk Interventions No flowsheet data found.

## 2020-08-10 NOTE — ED Triage Notes (Addendum)
Pt reports abdominal pain since 08/07/20. Last BM was this morning. Pt has been using mag citrate at home. Pt had a normal CT result on 08/07/20.

## 2020-08-11 ENCOUNTER — Observation Stay (HOSPITAL_COMMUNITY): Payer: No Typology Code available for payment source

## 2020-08-11 ENCOUNTER — Encounter (HOSPITAL_COMMUNITY)
Admission: EM | Disposition: A | Discharge status: Home / Self Care | Payer: Self-pay | Source: Home / Self Care | Attending: Emergency Medicine

## 2020-08-11 ENCOUNTER — Encounter (HOSPITAL_COMMUNITY): Payer: Self-pay | Admitting: General Surgery

## 2020-08-11 DIAGNOSIS — K810 Acute cholecystitis: Secondary | ICD-10-CM | POA: Diagnosis not present

## 2020-08-11 HISTORY — PX: CHOLECYSTECTOMY: SHX55

## 2020-08-11 HISTORY — PX: UMBILICAL HERNIA REPAIR: SHX196

## 2020-08-11 LAB — GLUCOSE, CAPILLARY
Glucose-Capillary: 234 mg/dL — ABNORMAL HIGH (ref 70–99)
Glucose-Capillary: 256 mg/dL — ABNORMAL HIGH (ref 70–99)
Glucose-Capillary: 286 mg/dL — ABNORMAL HIGH (ref 70–99)

## 2020-08-11 LAB — COMPREHENSIVE METABOLIC PANEL
ALT: 17 U/L (ref 0–44)
AST: 10 U/L — ABNORMAL LOW (ref 15–41)
Albumin: 2.7 g/dL — ABNORMAL LOW (ref 3.5–5.0)
Alkaline Phosphatase: 100 U/L (ref 38–126)
Anion gap: 9 (ref 5–15)
BUN: 20 mg/dL (ref 6–20)
CO2: 23 mmol/L (ref 22–32)
Calcium: 7.8 mg/dL — ABNORMAL LOW (ref 8.9–10.3)
Chloride: 100 mmol/L (ref 98–111)
Creatinine, Ser: 1.08 mg/dL (ref 0.61–1.24)
GFR, Estimated: >60 mL/min (ref >60)
Glucose, Bld: 228 mg/dL — ABNORMAL HIGH (ref 70–99)
Potassium: 4.2 mmol/L (ref 3.5–5.1)
Sodium: 132 mmol/L — ABNORMAL LOW (ref 135–145)
Total Bilirubin: 0.9 mg/dL (ref 0.3–1.2)
Total Protein: 6.5 g/dL (ref 6.5–8.1)

## 2020-08-11 LAB — CBC
HCT: 44.2 % (ref 39.0–52.0)
Hemoglobin: 13.9 g/dL (ref 13.0–17.0)
MCH: 28.2 pg (ref 26.0–34.0)
MCHC: 31.4 g/dL (ref 30.0–36.0)
MCV: 89.7 fL (ref 80.0–100.0)
Platelets: 184 10*3/uL (ref 150–400)
RBC: 4.93 MIL/uL (ref 4.22–5.81)
RDW: 12.3 % (ref 11.5–15.5)
WBC: 10.7 10*3/uL — ABNORMAL HIGH (ref 4.0–10.5)
nRBC: 0 % (ref 0.0–0.2)

## 2020-08-11 LAB — CBG MONITORING, ED
Glucose-Capillary: 229 mg/dL — ABNORMAL HIGH (ref 70–99)
Glucose-Capillary: 251 mg/dL — ABNORMAL HIGH (ref 70–99)

## 2020-08-11 SURGERY — LAPAROSCOPIC CHOLECYSTECTOMY
Anesthesia: General

## 2020-08-11 MED ORDER — DEXMEDETOMIDINE (PRECEDEX) IN NS 20 MCG/5ML (4 MCG/ML) IV SYRINGE
PREFILLED_SYRINGE | INTRAVENOUS | Status: AC
  Filled 2020-08-11: qty 5

## 2020-08-11 MED ORDER — FENTANYL CITRATE (PF) 100 MCG/2ML IJ SOLN
INTRAMUSCULAR | Status: DC | PRN
  Administered 2020-08-11: 50 ug via INTRAVENOUS
  Administered 2020-08-11: 150 ug via INTRAVENOUS
  Administered 2020-08-11: 100 ug via INTRAVENOUS
  Administered 2020-08-11: 50 ug via INTRAVENOUS

## 2020-08-11 MED ORDER — ROCURONIUM BROMIDE 10 MG/ML (PF) SYRINGE
PREFILLED_SYRINGE | INTRAVENOUS | Status: DC | PRN
  Administered 2020-08-11 (×3): 20 mg via INTRAVENOUS
  Administered 2020-08-11: 10 mg via INTRAVENOUS
  Administered 2020-08-11: 20 mg via INTRAVENOUS
  Administered 2020-08-11: 60 mg via INTRAVENOUS

## 2020-08-11 MED ORDER — KETAMINE HCL 10 MG/ML IJ SOLN
INTRAMUSCULAR | Status: DC | PRN
  Administered 2020-08-11 (×2): 10 mg via INTRAVENOUS

## 2020-08-11 MED ORDER — ONDANSETRON HCL 4 MG/2ML IJ SOLN
INTRAMUSCULAR | Status: AC
  Filled 2020-08-11: qty 2

## 2020-08-11 MED ORDER — LACTATED RINGERS IV SOLN: INTRAVENOUS | Status: DC

## 2020-08-11 MED ORDER — FENTANYL CITRATE (PF) 250 MCG/5ML IJ SOLN
INTRAMUSCULAR | Status: AC
  Filled 2020-08-11: qty 5

## 2020-08-11 MED ORDER — PIPERACILLIN-TAZOBACTAM 3.375 G IVPB
3.3750 g | Freq: Once | INTRAVENOUS | Status: AC
  Administered 2020-08-11: 18:00:00 3.375 g via INTRAVENOUS
  Filled 2020-08-11 (×2): qty 50

## 2020-08-11 MED ORDER — BUPIVACAINE HCL (PF) 0.5 % IJ SOLN
INTRAMUSCULAR | Status: DC | PRN
  Administered 2020-08-11: 20 mL

## 2020-08-11 MED ORDER — FENTANYL CITRATE (PF) 100 MCG/2ML IJ SOLN
25.0000 ug | INTRAMUSCULAR | Status: DC | PRN
  Administered 2020-08-11 (×2): 50 ug via INTRAVENOUS
  Filled 2020-08-11: qty 2

## 2020-08-11 MED ORDER — ROCURONIUM BROMIDE 10 MG/ML (PF) SYRINGE
PREFILLED_SYRINGE | INTRAVENOUS | Status: AC
  Filled 2020-08-11: qty 10

## 2020-08-11 MED ORDER — SUGAMMADEX SODIUM 200 MG/2ML IV SOLN
INTRAVENOUS | Status: DC | PRN
  Administered 2020-08-11: 200 mg via INTRAVENOUS

## 2020-08-11 MED ORDER — CHLORHEXIDINE GLUCONATE 0.12 % MT SOLN
OROMUCOSAL | Status: AC
  Filled 2020-08-11: qty 15

## 2020-08-11 MED ORDER — INSULIN GLARGINE 100 UNIT/ML ~~LOC~~ SOLN
20.0000 [IU] | Freq: Every day | SUBCUTANEOUS | Status: DC
  Filled 2020-08-11 (×4): qty 0.2

## 2020-08-11 MED ORDER — DEXAMETHASONE SODIUM PHOSPHATE 10 MG/ML IJ SOLN
INTRAMUSCULAR | Status: AC
  Filled 2020-08-11: qty 1

## 2020-08-11 MED ORDER — SUCCINYLCHOLINE CHLORIDE 20 MG/ML IJ SOLN
INTRAMUSCULAR | Status: DC | PRN
  Administered 2020-08-11: 200 mg via INTRAVENOUS

## 2020-08-11 MED ORDER — MIDAZOLAM HCL 5 MG/5ML IJ SOLN
INTRAMUSCULAR | Status: DC | PRN
  Administered 2020-08-11: 2 mg via INTRAVENOUS

## 2020-08-11 MED ORDER — DEXAMETHASONE SODIUM PHOSPHATE 4 MG/ML IJ SOLN
INTRAMUSCULAR | Status: DC | PRN
  Administered 2020-08-11: 4 mg via INTRAVENOUS

## 2020-08-11 MED ORDER — MIDAZOLAM HCL 2 MG/2ML IJ SOLN
INTRAMUSCULAR | Status: AC
  Filled 2020-08-11: qty 2

## 2020-08-11 MED ORDER — DEXMEDETOMIDINE (PRECEDEX) IN NS 20 MCG/5ML (4 MCG/ML) IV SYRINGE
PREFILLED_SYRINGE | INTRAVENOUS | Status: AC
  Filled 2020-08-11: qty 10

## 2020-08-11 MED ORDER — METOCLOPRAMIDE HCL 5 MG/ML IJ SOLN
INTRAMUSCULAR | Status: DC | PRN
  Administered 2020-08-11: 10 mg via INTRAVENOUS

## 2020-08-11 MED ORDER — ONDANSETRON HCL 4 MG/2ML IJ SOLN
INTRAMUSCULAR | Status: DC | PRN
  Administered 2020-08-11: 4 mg via INTRAVENOUS

## 2020-08-11 MED ORDER — PROPOFOL 10 MG/ML IV BOLUS
INTRAVENOUS | Status: DC | PRN
  Administered 2020-08-11: 200 mg via INTRAVENOUS

## 2020-08-11 MED ORDER — EPHEDRINE 5 MG/ML INJ
INTRAVENOUS | Status: AC
  Filled 2020-08-11: qty 10

## 2020-08-11 MED ORDER — LIDOCAINE HCL (PF) 2 % IJ SOLN
INTRAMUSCULAR | Status: AC
  Filled 2020-08-11: qty 5

## 2020-08-11 MED ORDER — FENTANYL CITRATE (PF) 100 MCG/2ML IJ SOLN
INTRAMUSCULAR | Status: AC
  Filled 2020-08-11: qty 2

## 2020-08-11 MED ORDER — PIPERACILLIN-TAZOBACTAM 3.375 G IVPB
3.3750 g | Freq: Three times a day (TID) | INTRAVENOUS | Status: DC
  Administered 2020-08-12 (×2): 3.375 g via INTRAVENOUS
  Filled 2020-08-11 (×7): qty 50

## 2020-08-11 MED ORDER — BUPIVACAINE HCL (PF) 0.5 % IJ SOLN
INTRAMUSCULAR | Status: AC
  Filled 2020-08-11: qty 30

## 2020-08-11 MED ORDER — KETAMINE HCL 50 MG/5ML IJ SOSY
PREFILLED_SYRINGE | INTRAMUSCULAR | Status: AC
  Filled 2020-08-11: qty 5

## 2020-08-11 MED ORDER — LIDOCAINE HCL (CARDIAC) PF 100 MG/5ML IV SOSY
PREFILLED_SYRINGE | INTRAVENOUS | Status: DC | PRN
  Administered 2020-08-11: 100 mg via INTRAVENOUS

## 2020-08-11 MED ORDER — PROPOFOL 10 MG/ML IV BOLUS
INTRAVENOUS | Status: AC
  Filled 2020-08-11: qty 20

## 2020-08-11 MED ORDER — HEMOSTATIC AGENTS (NO CHARGE) OPTIME
TOPICAL | Status: DC | PRN
  Administered 2020-08-11: 2 via TOPICAL

## 2020-08-11 MED ORDER — METOCLOPRAMIDE HCL 5 MG/ML IJ SOLN
INTRAMUSCULAR | Status: AC
  Filled 2020-08-11: qty 2

## 2020-08-11 MED ORDER — METOPROLOL TARTRATE 5 MG/5ML IV SOLN
INTRAVENOUS | Status: DC | PRN
  Administered 2020-08-11 (×3): 1 mg via INTRAVENOUS

## 2020-08-11 MED ORDER — DEXMEDETOMIDINE HCL 200 MCG/2ML IV SOLN
INTRAVENOUS | Status: DC | PRN
  Administered 2020-08-11: 8 ug via INTRAVENOUS
  Administered 2020-08-11: 20 ug via INTRAVENOUS

## 2020-08-11 MED ORDER — SODIUM CHLORIDE 0.9 % IR SOLN
Status: DC | PRN
  Administered 2020-08-11: 1000 mL

## 2020-08-11 MED ORDER — ROCURONIUM BROMIDE 10 MG/ML (PF) SYRINGE
PREFILLED_SYRINGE | INTRAVENOUS | Status: AC
  Filled 2020-08-11: qty 20

## 2020-08-11 MED ORDER — METOPROLOL TARTRATE 5 MG/5ML IV SOLN
INTRAVENOUS | Status: AC
  Filled 2020-08-11: qty 5

## 2020-08-11 MED ORDER — ONDANSETRON HCL 4 MG/2ML IJ SOLN: 4.0000 mg | Freq: Once | INTRAMUSCULAR | Status: DC | PRN

## 2020-08-11 MED ORDER — EPHEDRINE SULFATE 50 MG/ML IJ SOLN
INTRAMUSCULAR | Status: DC | PRN
  Administered 2020-08-11: 10 mg via INTRAVENOUS

## 2020-08-11 MED ORDER — SUCCINYLCHOLINE CHLORIDE 200 MG/10ML IV SOSY
PREFILLED_SYRINGE | INTRAVENOUS | Status: AC
  Filled 2020-08-11: qty 10

## 2020-08-11 SURGICAL SUPPLY — 57 items
APPLIER CLIP ROT 10 11.4 M/L (STAPLE) ×3
BAG RETRIEVAL 10 (BASKET) ×1
BAG RETRIEVAL 10MM (BASKET) ×1
BLADE SURG 15 STRL LF DISP TIS (BLADE) ×1 IMPLANT
BLADE SURG 15 STRL SS (BLADE) ×2
CHLORAPREP W/TINT 26 (MISCELLANEOUS) ×3 IMPLANT
CLIP APPLIE ROT 10 11.4 M/L (STAPLE) ×1 IMPLANT
CLOTH BEACON ORANGE TIMEOUT ST (SAFETY) ×3 IMPLANT
COVER LIGHT HANDLE STERIS (MISCELLANEOUS) ×6 IMPLANT
COVER WAND RF STERILE (DRAPES) ×3 IMPLANT
CUTTER FLEX LINEAR 45M (STAPLE) ×3 IMPLANT
DECANTER SPIKE VIAL GLASS SM (MISCELLANEOUS) ×3 IMPLANT
DRSG GAUZE PETRO 3X36 STRIP (GAUZE/BANDAGES/DRESSINGS) ×3 IMPLANT
DRSG TEGADERM 4X4.75 (GAUZE/BANDAGES/DRESSINGS) ×3 IMPLANT
ELECT REM PT RETURN 9FT ADLT (ELECTROSURGICAL) ×3
ELECTRODE REM PT RTRN 9FT ADLT (ELECTROSURGICAL) ×1 IMPLANT
EVACUATOR 1/8 PVC DRAIN (DRAIN) ×3 IMPLANT
EVACUATOR DRAINAGE 10X20 100CC (DRAIN) ×1 IMPLANT
EVACUATOR SILICONE 100CC (DRAIN) ×2
GLOVE BIO SURGEON STRL SZ 6.5 (GLOVE) ×2 IMPLANT
GLOVE BIO SURGEONS STRL SZ 6.5 (GLOVE) ×1
GLOVE BIOGEL PI IND STRL 6.5 (GLOVE) ×1 IMPLANT
GLOVE BIOGEL PI IND STRL 7.0 (GLOVE) ×3 IMPLANT
GLOVE BIOGEL PI INDICATOR 6.5 (GLOVE) ×2
GLOVE BIOGEL PI INDICATOR 7.0 (GLOVE) ×6
GOWN STRL REUS W/TWL LRG LVL3 (GOWN DISPOSABLE) ×9 IMPLANT
HEMOSTAT SNOW SURGICEL 2X4 (HEMOSTASIS) ×6 IMPLANT
INST SET LAPROSCOPIC AP (KITS) ×3 IMPLANT
KIT TURNOVER KIT A (KITS) ×3 IMPLANT
MANIFOLD NEPTUNE II (INSTRUMENTS) ×3 IMPLANT
NEEDLE INSUFFLATION 14GA 120MM (NEEDLE) ×3 IMPLANT
NS IRRIG 1000ML POUR BTL (IV SOLUTION) ×3 IMPLANT
PACK LAP CHOLE LZT030E (CUSTOM PROCEDURE TRAY) ×3 IMPLANT
PAD ARMBOARD 7.5X6 YLW CONV (MISCELLANEOUS) ×3 IMPLANT
PENCIL HANDSWITCHING (ELECTRODE) ×3 IMPLANT
RELOAD 45 VASCULAR/THIN (ENDOMECHANICALS) ×3 IMPLANT
SET BASIN LINEN APH (SET/KITS/TRAYS/PACK) ×3 IMPLANT
SET TUBE IRRIG SUCTION NO TIP (IRRIGATION / IRRIGATOR) ×3 IMPLANT
SET TUBE SMOKE EVAC HIGH FLOW (TUBING) ×3 IMPLANT
SLEEVE ENDOPATH XCEL 5M (ENDOMECHANICALS) ×3 IMPLANT
SPONGE DRAIN TRACH 4X4 STRL 2S (GAUZE/BANDAGES/DRESSINGS) ×3 IMPLANT
SPONGE GAUZE 2X2 8PLY STER LF (GAUZE/BANDAGES/DRESSINGS) ×5
SPONGE GAUZE 2X2 8PLY STRL LF (GAUZE/BANDAGES/DRESSINGS) ×10 IMPLANT
STAPLER VISISTAT (STAPLE) ×3 IMPLANT
SUT ETHILON 3 0 FSL (SUTURE) ×3 IMPLANT
SUT MNCRL AB 4-0 PS2 18 (SUTURE) ×6 IMPLANT
SUT VIC AB 3-0 SH 27 (SUTURE) ×4
SUT VIC AB 3-0 SH 27X BRD (SUTURE) ×2 IMPLANT
SUT VICRYL 0 UR6 27IN ABS (SUTURE) ×3 IMPLANT
SYS BAG RETRIEVAL 10MM (BASKET) ×1
SYSTEM BAG RETRIEVAL 10MM (BASKET) ×1 IMPLANT
TROCAR ENDO BLADELESS 11MM (ENDOMECHANICALS) ×3 IMPLANT
TROCAR XCEL NON-BLD 5MMX100MML (ENDOMECHANICALS) ×3 IMPLANT
TROCAR XCEL UNIV SLVE 11M 100M (ENDOMECHANICALS) ×3 IMPLANT
TUBE CONNECTING 12'X1/4 (SUCTIONS) ×1
TUBE CONNECTING 12X1/4 (SUCTIONS) ×2 IMPLANT
WARMER LAPAROSCOPE (MISCELLANEOUS) ×3 IMPLANT

## 2020-08-11 NOTE — Progress Notes (Signed)
Pharmacy Antibiotic Note  Gary Thompson is a 49 y.o. male admitted on 08/10/2020 with intra-abdominal infection.  Pharmacy has been consulted for Zosyn dosing.  Plan: Zosyn 3.375g IV q8h (4 hour infusion).  Monitor labs, c/s, and patient improvement.  Height: 5\' 6"  (167.6 cm) Weight: 124.3 kg (274 lb) IBW/kg (Calculated) : 63.8  Temp (24hrs), Avg:98.6 F (37 C), Min:98.3 F (36.8 C), Max:98.9 F (37.2 C)  Recent Labs  Lab 08/07/20 0740 08/10/20 1255 08/11/20 0329  WBC 8.6 13.6* 10.7*  CREATININE 1.39* 1.25* 1.08    Estimated Creatinine Clearance: 104.1 mL/min (by C-G formula based on SCr of 1.08 mg/dL).    Allergies  Allergen Reactions  . Vicks Dayquil-Nyquil Cld & Flu [Pe-Dm-Apap & Doxylamin-Dm-Apap] Other (See Comments)    "overly sedated feeling'    Antimicrobials this admission: Zosyn 12/16 >>     Dose adjustments this admission: N/A  Microbiology results: COVID -   Thank you for allowing pharmacy to be a part of this patient's care.  Ramond Craver 08/11/2020 4:39 PM

## 2020-08-11 NOTE — Transfer of Care (Signed)
Immediate Anesthesia Transfer of Care Note  Patient: [name]  Procedure(s) Performed: LAPAROSCOPIC CHOLECYSTECTOMY (N/A )  Patient Location: PACU  Anesthesia Type:General  Level of Consciousness: awake, alert , oriented and patient cooperative  Airway & Oxygen Therapy: Patient Spontanous Breathing and Patient connected to nasal cannula oxygen  Post-op Assessment: Report given to RN, Post -op Vital signs reviewed and stable and Patient moving all extremities  Post vital signs: Reviewed and stable  Last Vitals:  Vitals Value Taken Time  BP    Temp    Pulse    Resp    SpO2      Last Pain:  Vitals:   08/11/20 0847  TempSrc: Oral  PainSc: 3       Patients Stated Pain Goal: 5 (58/31/67 4255)  Complications: No complications documented.

## 2020-08-11 NOTE — ED Notes (Signed)
Pt made aware of CBG, refused insulin.

## 2020-08-11 NOTE — Interval H&P Note (Signed)
History and Physical Interval Note:  08/11/2020 8:56 AM  Gary Thompson  has presented today for surgery, with the diagnosis of acute cholecystitis.  The various methods of treatment have been discussed with the patient and family. After consideration of risks, benefits and other options for treatment, the patient has consented to  Procedure(s): LAPAROSCOPIC CHOLECYSTECTOMY (N/A) as a surgical intervention.  The patient's history has been reviewed, patient examined, no change in status, stable for surgery.  I have reviewed the patient's chart and labs.  Questions were answered to the patient's satisfaction.    Anxious but otherwise ok. Pain improved.   Virl Cagey

## 2020-08-11 NOTE — Progress Notes (Addendum)
[facility]  Lap chole completed for gangrenous cholecystitis. Patient has drain. Will stay for now until tolerating diet and pain controlled. Antibiotics for 5 days post op per the STOP IT Trial for purulent gangrenous infection.  Clear diet PRN pain Labs tomorrow  SSI and lantus for DM  Updated Friend Ms. Philbert Riser.   Curlene Labrum, MD [facility] [address] [place], [place] [address] [phone] (office)

## 2020-08-11 NOTE — Progress Notes (Signed)
Inpatient Diabetes Program Recommendations  AACE/ADA: New Consensus Statement on Inpatient Glycemic Control (2015)  Target Ranges:  Prepandial:   less than 140 mg/dL      Peak postprandial:   less than 180 mg/dL (1-2 hours)      Critically ill patients:  140 - 180 mg/dL   Lab Results  Component Value Date   GLUCAP 251 (H) 08/11/2020   HGBA1C 9.8 (H) 08/10/2020    Review of Glycemic Control Results for PANAGIOTIS, OELKERS (MRN [id number]) as of 08/11/2020 10:31  Ref. Range 08/11/2020 03:36 08/11/2020 08:07  Glucose-Capillary Latest Ref Range: 70 - 99 mg/dL 229 (H) 251 (H)   Diabetes history: DM 2 Outpatient Diabetes medications:  Glucotrol 10 mg bid Current orders for Inpatient glycemic control:  Novolog resistant q 4 hours  Inpatient Diabetes Program Recommendations:    CBG's>goal. Please add Lantus 20 units daily while in the hospital.  May also benefit from the addition of Metformin??  Will f/u with patient on 08/12/20 regarding DM and A1C.    Thanks  Adah Perl, RN, BC-ADM Inpatient Diabetes Coordinator Pager [phone] (8a-5p)

## 2020-08-11 NOTE — ED Notes (Signed)
Pt states "I'm burning up" RN notified

## 2020-08-11 NOTE — Anesthesia Procedure Notes (Signed)
Procedure Name: Intubation Performed by: Tacy Learn, CRNA Pre-anesthesia Checklist: Patient identified, Emergency Drugs available, Suction available, Patient being monitored and Timeout performed Patient Re-evaluated:Patient Re-evaluated prior to induction Oxygen Delivery Method: Circle system utilized Preoxygenation: Pre-oxygenation with 100% oxygen Induction Type: IV induction Laryngoscope Size: Miller and 2 Grade View: Grade II Tube size: 7.5 mm Number of attempts: 1 Airway Equipment and Method: Stylet Placement Confirmation: ETT inserted through vocal cords under direct vision,  positive ETCO2,  CO2 detector and breath sounds checked- equal and bilateral Secured at: 23 cm Tube secured with: Tape Dental Injury: Teeth and Oropharynx as per pre-operative assessment

## 2020-08-11 NOTE — Progress Notes (Signed)
Dr Briant Cedar aware of CBG 251.  No orders given.

## 2020-08-11 NOTE — OR Nursing (Signed)
Voided 300cc orange colored urine

## 2020-08-11 NOTE — Op Note (Signed)
Operative Note   Preoperative Diagnosis: Acute cholecystitis, umbilical hernia     Postoperative Diagnosis: Gangrenous cholecystitis, umbilical hernia    Procedure(s) Performed: Laparoscopic cholecystectomy, primary closure of umbilical hernia    Surgeon: Lanell Matar. Constance Haw, MD   Assistants: No qualified resident was available   Anesthesia: General endotracheal   Anesthesiologist: Louann Sjogren, MD    Specimens: Gallbladder    Estimated Blood Loss: Minimal    Blood Replacement: None    Complications: None    Operative Findings: Gangrenous cholecystitis with purulent drainage and necrotic wall    Procedure: The patient was taken to the operating room and placed supine. General endotracheal anesthesia was induced. Intravenous antibiotics were administered per protocol. An orogastric tube positioned to decompress the stomach. The abdomen was prepared and draped in the usual sterile fashion.    A supraumbilical incision was made and the hernia sac was dissected out and opened. Omentum was non reducible in a 1cm defect. I ligated the omentum and was able to reduce the rest, and placed the 11 mm optiview port and 10 mm 0-degree operative laparoscope through the hernia defect. The area underlying the trocar was inspected and without evidence of injury.  Remaining trocars were placed under direct vision. Two 5 mm ports were placed in the right abdomen, between the anterior axillary and midclavicular line.  A final 11 mm port was placed through the mid-epigastrium, near the falciform ligament.  I looked back at the umbilical port and additional omentum inferior was taken down with cautery. No bleeding was noted from the omentum.     The gallbladder was distended and was opened up and suctioned out. Purulent drainage was noted. The gallbladder was very thickened and had signs of wall necrosis consistent with a gangrenous gallbladder.  After the decompression the gallbladder fundus was elevated  cephalad and the infundibulum was retracted to the patient's right. To get better mobilization the lateral and medial side walls and top of the dome where taken down with cautery to better grasp the gallbladder. This allowed for better elevation and retraction.  The gallbladder/cystic duct junction was skeletonized. The cystic artery noted in the triangle of Calot and was also skeletonized.  We then continued liberal medial and lateral dissection until the critical view of safety was achieved.    The cystic duct was < 1cm in size but was thickened and clips slipped off easily. Given this a vascular staple load was used to come across it.  The cystic artery was doubly clipped and divided. The gallbladder was then dissected from the remaining liver bed with electrocautery. The specimen was placed in an Endopouch and was retrieved through the epigastric site. A JP drain was placed through the medial right side port site and secured with 3-0 Nylon.     Final inspection revealed acceptable hemostasis. Surgical SNOW was placed in the gallbladder bed.  Trocars were removed and pneumoperitoneum was released.  0 Vicryl fascial sutures were used to close the epigastric port site. Interrupted 0 Vicryl fascial sutures were also used to close the umbilical hernia.  The skin of the umbilicus was tacked down with 3-0 Vicryl suture.  Skin incisions were closed with staples due to the gangrenous cholecystitis. The incisions were covered with gauze and tape and a pressure dressing was placed in the umbilicus to hold the skin down. The patient was awakened from anesthesia and extubated without complication.    Curlene Labrum, MD [facility] [place],  [place] [address] [phone] (office)

## 2020-08-11 NOTE — Anesthesia Postprocedure Evaluation (Signed)
Anesthesia Post Note  Patient: Gary Thompson  Procedure(s) Performed: LAPAROSCOPIC CHOLECYSTECTOMY (N/A )  Patient location during evaluation: PACU Anesthesia Type: General Level of consciousness: awake, oriented, awake and alert and patient cooperative Pain management: pain level controlled Vital Signs Assessment: post-procedure vital signs reviewed and stable Respiratory status: spontaneous breathing, respiratory function stable and nonlabored ventilation Cardiovascular status: blood pressure returned to baseline and stable Postop Assessment: no headache and no backache Anesthetic complications: no   No complications documented.   Last Vitals:  Vitals:   08/11/20 0920 08/11/20 0930  BP:    Pulse: 85 85  Resp: 15 (!) 7  Temp:    SpO2: 96% 95%    Last Pain:  Vitals:   08/11/20 0847  TempSrc: Oral  PainSc: 3                  Tacy Learn

## 2020-08-11 NOTE — Anesthesia Preprocedure Evaluation (Signed)
Anesthesia Evaluation  Patient identified by MRN, date of birth, ID band Patient awake    Reviewed: Allergy & Precautions, H&P , NPO status , Patient's Chart, lab work & pertinent test results, reviewed documented beta blocker date and time   Airway Mallampati: II  TM Distance: >3 FB Neck ROM: full    Dental no notable dental hx.    Pulmonary sleep apnea ,    Pulmonary exam normal breath sounds clear to auscultation       Cardiovascular Exercise Tolerance: Good negative cardio ROS   Rhythm:regular Rate:Normal     Neuro/Psych Anxiety negative neurological ROS  negative psych ROS   GI/Hepatic negative GI ROS, Neg liver ROS,   Endo/Other  diabetesMorbid obesity  Renal/GU negative Renal ROS  negative genitourinary   Musculoskeletal   Abdominal   Peds  Hematology negative hematology ROS (+)   Anesthesia Other Findings   Reproductive/Obstetrics negative OB ROS                             Anesthesia Physical Anesthesia Plan  ASA: III and emergent  Anesthesia Plan: General   Post-op Pain Management:    Induction:   PONV Risk Score and Plan: Ondansetron  Airway Management Planned:   Additional Equipment:   Intra-op Plan:   Post-operative Plan:   Informed Consent: I have reviewed the patients History and Physical, chart, labs and discussed the procedure including the risks, benefits and alternatives for the proposed anesthesia with the patient or authorized representative who has indicated his/her understanding and acceptance.     Dental Advisory Given  Plan Discussed with: CRNA  Anesthesia Plan Comments:         Anesthesia Quick Evaluation

## 2020-08-12 ENCOUNTER — Encounter (HOSPITAL_COMMUNITY): Payer: Self-pay | Admitting: General Surgery

## 2020-08-12 DIAGNOSIS — K810 Acute cholecystitis: Secondary | ICD-10-CM | POA: Diagnosis present

## 2020-08-12 LAB — CBC WITH DIFFERENTIAL/PLATELET
Abs Immature Granulocytes: 0.06 10*3/uL (ref 0.00–0.07)
Basophils Absolute: 0 10*3/uL (ref 0.0–0.1)
Basophils Relative: 0 %
Eosinophils Absolute: 0 10*3/uL (ref 0.0–0.5)
Eosinophils Relative: 0 %
HCT: 37.5 % — ABNORMAL LOW (ref 39.0–52.0)
Hemoglobin: 11.9 g/dL — ABNORMAL LOW (ref 13.0–17.0)
Immature Granulocytes: 1 %
Lymphocytes Relative: 7 %
Lymphs Abs: 0.5 10*3/uL — ABNORMAL LOW (ref 0.7–4.0)
MCH: 28.1 pg (ref 26.0–34.0)
MCHC: 31.7 g/dL (ref 30.0–36.0)
MCV: 88.7 fL (ref 80.0–100.0)
Monocytes Absolute: 0.6 10*3/uL (ref 0.1–1.0)
Monocytes Relative: 9 %
Neutro Abs: 5.9 10*3/uL (ref 1.7–7.7)
Neutrophils Relative %: 83 %
Platelets: 194 10*3/uL (ref 150–400)
RBC: 4.23 MIL/uL (ref 4.22–5.81)
RDW: 12.4 % (ref 11.5–15.5)
WBC: 7.1 10*3/uL (ref 4.0–10.5)
nRBC: 0 % (ref 0.0–0.2)

## 2020-08-12 LAB — COMPREHENSIVE METABOLIC PANEL
ALT: 54 U/L — ABNORMAL HIGH (ref 0–44)
AST: 42 U/L — ABNORMAL HIGH (ref 15–41)
Albumin: 2.4 g/dL — ABNORMAL LOW (ref 3.5–5.0)
Alkaline Phosphatase: 99 U/L (ref 38–126)
Anion gap: 10 (ref 5–15)
BUN: 22 mg/dL — ABNORMAL HIGH (ref 6–20)
CO2: 24 mmol/L (ref 22–32)
Calcium: 7.7 mg/dL — ABNORMAL LOW (ref 8.9–10.3)
Chloride: 98 mmol/L (ref 98–111)
Creatinine, Ser: 1.18 mg/dL (ref 0.61–1.24)
GFR, Estimated: >60 mL/min (ref >60)
Glucose, Bld: 253 mg/dL — ABNORMAL HIGH (ref 70–99)
Potassium: 4.6 mmol/L (ref 3.5–5.1)
Sodium: 132 mmol/L — ABNORMAL LOW (ref 135–145)
Total Bilirubin: 0.9 mg/dL (ref 0.3–1.2)
Total Protein: 6 g/dL — ABNORMAL LOW (ref 6.5–8.1)

## 2020-08-12 LAB — GLUCOSE, CAPILLARY
Glucose-Capillary: 260 mg/dL — ABNORMAL HIGH (ref 70–99)
Glucose-Capillary: 192 mg/dL — ABNORMAL HIGH (ref 70–99)
Glucose-Capillary: 227 mg/dL — ABNORMAL HIGH (ref 70–99)
Glucose-Capillary: 265 mg/dL — ABNORMAL HIGH (ref 70–99)

## 2020-08-12 MED ORDER — LIVING WELL WITH DIABETES BOOK: Freq: Once | Status: AC

## 2020-08-12 MED ORDER — INSULIN GLARGINE 100 UNIT/ML ~~LOC~~ SOLN
20.0000 [IU] | Freq: Every day | SUBCUTANEOUS | Status: DC
  Administered 2020-08-12: 13:00:00 20 [IU] via SUBCUTANEOUS
  Filled 2020-08-12 (×4): qty 0.2

## 2020-08-12 MED ORDER — AMOXICILLIN-POT CLAVULANATE 875-125 MG PO TABS: 1.0000 | ORAL_TABLET | Freq: Two times a day (BID) | ORAL | 0 refills | Status: AC

## 2020-08-12 MED ORDER — ONDANSETRON 4 MG PO TBDP: 4.0000 mg | ORAL_TABLET | Freq: Four times a day (QID) | ORAL | 0 refills | Status: DC | PRN

## 2020-08-12 MED ORDER — OXYCODONE HCL 5 MG PO TABS: 5.0000 mg | ORAL_TABLET | ORAL | 0 refills | Status: DC | PRN

## 2020-08-12 MED ORDER — INSULIN GLARGINE 100 UNIT/ML ~~LOC~~ SOLN: 20.0000 [IU] | Freq: Every day | SUBCUTANEOUS | 0 refills | Status: DC

## 2020-08-12 NOTE — Discharge Summary (Addendum)
Physician Discharge Summary  Patient ID: Gary Thompson MRN: [id number] DOB/AGE: 48-Jun-1972 48 y.o.  Admit date: 08/10/2020 Discharge date: 08/12/2020  Admission Diagnoses: Acute cholecystitis   Discharge Diagnoses:  Principal Problem:   Gangrenous cholecystitis Active Problems:   Acute cholecystitis   Discharged Condition: good  Hospital Course: Mr. Balandran is a 49 yo who came in with abdominal pain and findings of cholecystitis. He went for a laparoscopic cholecystectomy and was found to have a gangrenous gallbladder. He did well after surgery and felt well to go home. He was eating and drinking, having pain control, and was educated on his drain care for going home.   He took lantus today and will start that at home. He is getting set up with a PCP in [place].  Consults: Diabetes educator RN- recommended lantus 20 mg daily in addition to glipizide   Significant Diagnostic Studies:  Lab Results  Component Value Date   WBC 7.1 08/12/2020   HGB 11.9 (L) 08/12/2020   HCT 37.5 (L) 08/12/2020   MCV 88.7 08/12/2020   PLT 194 96/29/5284   Last metabolic panel Lab Results  Component Value Date   GLUCOSE 253 (H) 08/12/2020   NA 132 (L) 08/12/2020   K 4.6 08/12/2020   CL 98 08/12/2020   CO2 24 08/12/2020   BUN 22 (H) 08/12/2020   CREATININE 1.18 08/12/2020   GFRNONAA >60 08/12/2020   CALCIUM 7.7 (L) 08/12/2020   PROT 6.0 (L) 08/12/2020   ALBUMIN 2.4 (L) 08/12/2020   BILITOT 0.9 08/12/2020   ALKPHOS 99 08/12/2020   AST 42 (H) 08/12/2020   ALT 54 (H) 08/12/2020   ANIONGAP 10 08/12/2020     Treatments:  Laparoscopic cholecystectomy 08/11/2020   Discharge Exam: Blood pressure 135/74, pulse 79, temperature 98.5 F (36.9 C), temperature source Oral, resp. rate 20, height 5\' 6"  (1.676 m), weight 124.3 kg, SpO2 100 %. General appearance: alert, cooperative and no distress Resp: normal work of breathing GI: soft, mildly tender and distended, dressing in place, JP with SS  output  Disposition: Discharge disposition: 01-Home or Self Care       Discharge Instructions    Call MD for:  difficulty breathing, headache or visual disturbances   Complete by: As directed    Call MD for:  extreme fatigue   Complete by: As directed    Call MD for:  persistant dizziness or light-headedness   Complete by: As directed    Call MD for:  persistant nausea and vomiting   Complete by: As directed    Call MD for:  redness, tenderness, or signs of infection (pain, swelling, redness, odor or green/yellow discharge around incision site)   Complete by: As directed    Call MD for:  severe uncontrolled pain   Complete by: As directed    Call MD for:  temperature >100.4   Complete by: As directed    Increase activity slowly   Complete by: As directed    Remove dressing in 24 hours   Complete by: As directed      Allergies as of 08/12/2020      Reactions   Vicks Dayquil-nyquil Cld & Flu [pe-dm-apap & Doxylamin-dm-apap] Other (See Comments)   "overly sedated feeling'      Medication List    STOP taking these medications   HYDROcodone-acetaminophen 5-325 MG tablet Commonly known as: Norco     TAKE these medications   amoxicillin-clavulanate 875-125 MG tablet Commonly known as: Augmentin Take 1 tablet by mouth every  12 (twelve) hours for 4 days.   aspirin-sod bicarb-citric acid 325 MG Tbef tablet Commonly known as: ALKA-SELTZER Take 325 mg by mouth every 6 (six) hours as needed.   clonazePAM 1 MG tablet Commonly known as: KLONOPIN Take 1 tablet by mouth daily as needed.   docusate sodium 100 MG capsule Commonly known as: COLACE Take 100 mg by mouth 2 (two) times daily.   glipiZIDE 10 MG tablet Commonly known as: GLUCOTROL Take 1 tablet by mouth 2 (two) times daily.   insulin glargine 100 UNIT/ML injection Commonly known as: LANTUS Inject 0.2 mLs (20 Units total) into the skin daily.   omeprazole 20 MG capsule Commonly known as: PRILOSEC Take 1  tablet by mouth daily.   ondansetron 4 MG disintegrating tablet Commonly known as: ZOFRAN-ODT Take 1 tablet (4 mg total) by mouth every 6 (six) hours as needed for nausea.   oxyCODONE 5 MG immediate release tablet Commonly known as: Oxy IR/ROXICODONE Take 1 tablet (5 mg total) by mouth every 4 (four) hours as needed for severe pain or breakthrough pain.       Follow-up Information    Virl Cagey, MD Follow up on 08/16/2020.   Specialty: General Surgery Why: drain removal/ follow up  Contact information: [address] [place] [address] [phone]               Signed: Virl Cagey 08/12/2020, 4:28 PM

## 2020-08-12 NOTE — Progress Notes (Signed)
Inpatient Diabetes Program Recommendations  AACE/ADA: New Consensus Statement on Inpatient Glycemic Control (2015)  Target Ranges:  Prepandial:   less than 140 mg/dL      Peak postprandial:   less than 180 mg/dL (1-2 hours)      Critically ill patients:  140 - 180 mg/dL   Lab Results  Component Value Date   GLUCAP 192 (H) 08/12/2020   HGBA1C 9.8 (H) 08/10/2020    Review of Glycemic Control Results for Gary Thompson, HEIDRICH (MRN [id number]) as of 08/12/2020 09:41  Ref. Range 08/11/2020 15:41 08/11/2020 20:23 08/11/2020 23:52 08/12/2020 04:33 08/12/2020 08:07  Glucose-Capillary Latest Ref Range: 70 - 99 mg/dL 234 (H) 286 (H) 256 (H) 260 (H) 192 (H)  Diabetes history: DM 2 Outpatient Diabetes medications:  Glucotrol 10 mg bid Current orders for Inpatient glycemic control:  Novolog resistant q 4 hours Lantus 20 units q HS Inpatient Diabetes Program Recommendations:    Per documentation, patient did not receive Lantus last PM?  Consider moving dose to 10 AM so that patient can get dose earlier today.   Spoke by phone with patient.  He states that he is in the process of getting insurance.  He currently only takes Glucotrol and states that Metformin causes GI upset.  He has been on insulin in the past, however could not afford without insurance.  He states he tried the [employer] brand but felt that it "did not work".   He is agreeable to being on insulin after discharge and states he knows how to administer insulin using insulin pen.  He has glucose meter.  We reviewed normal blood sugar values and hypoglycemia signs, symptoms and treatment.  Patient verbalized understanding,  He admits that sodas are a weakness of his.  He has recently put on about 20 pounds.  We discussed the importance of exercise and diet to reduce blood sugars.  Will order LWWD booklet for patient as well.  Encouraged close f/u with patients PCP.  Discussed that A1C is up from 8.8% to 9.8% and the importance of glycemic control.    Discussed with MD as well.   Thanks  Adah Perl, RN, BC-ADM Inpatient Diabetes Coordinator Pager [phone] (8a-5p)

## 2020-08-12 NOTE — Discharge Instructions (Signed)
Will get referred to PCP for further diabetes control and management.  Diabetes RN has spoken to you and you will be started on Lantus 20 daily. Please take this as instructed. Check your blood sugars regularly. Continue glipizide.   Discharge Laparoscopic Surgery Instructions:  Common Complaints: Right shoulder pain is common after laparoscopic surgery. This is secondary to the gas used in the surgery being trapped under the diaphragm.  Walk to help your body absorb the gas. This will improve in a few days. Pain at the port sites are common, especially the larger port sites. This will improve with time.  Some nausea is common and poor appetite. The main goal is to stay hydrated the first few days after surgery.   Diet/ Activity: Diet as tolerated. You may not have an appetite, but it is important to stay hydrated. Drink 64 ounces of water a day. Your appetite will return with time.  You can remove the dressings on 08/13/2020. After this you can shower per your regular routine daily just keep the drain covered with plastic and tape.  Walk everyday for at least 15-20 minutes. Deep cough and move around every 1-2 hours in the first few days after surgery.  Do not lift > 10 lbs, perform excessive bending, pushing, pulling, squatting for 1-2 weeks after surgery.  Do not pick at the staples.   Do not place lotions or balms on your incision unless instructed to specifically by Dr. Constance Haw.   Pain Expectations and Narcotics: -After surgery you will have pain associated with your incisions and this is normal. The pain is muscular and nerve pain, and will get better with time. -You are encouraged and expected to take non narcotic medications like tylenol and ibuprofen (when able) to treat pain as multiple modalities can aid with pain treatment. -Narcotics are only used when pain is severe or there is breakthrough pain. -You are not expected to have a pain score of 0 after surgery, as we cannot prevent  pain. A pain score of 3-4 that allows you to be functional, move, walk, and tolerate some activity is the goal. The pain will continue to improve over the days after surgery and is dependent on your surgery. -Due to [place] law, we are only able to give a certain amount of pain medication to treat post operative pain, and we only give additional narcotics on a patient by patient basis.  -For most laparoscopic surgery, studies have shown that the majority of patients only need 10-15 narcotic pills, and for open surgeries most patients only need 15-20.   -Having appropriate expectations of pain and knowledge of pain management with non narcotics is important as we do not want anyone to become addicted to narcotic pain medication.  -Using ice packs in the first 48 hours and heating pads after 48 hours, wearing an abdominal binder (when recommended), and using over the counter medications are all ways to help with pain management.   -Simple acts like meditation and mindfulness practices after surgery can also help with pain control and research has proven the benefit of these practices.  Medication: Take tylenol and ibuprofen as needed for pain control, alternating every 4-6 hours.  Example:  Tylenol 1000mg  @ 6am, 12noon, 6pm, 4midnight (Do not exceed 4000mg  of tylenol a day). Ibuprofen 800mg  @ 9am, 3pm, 9pm, 3am (Do not exceed 3600mg  of ibuprofen a day).  Take Roxicodone for breakthrough pain every 4 hours.  Take Colace for constipation related to narcotic pain medication. If you  do not have a bowel movement in 2 days, take Miralax over the counter.  Drink plenty of water to also prevent constipation.   Contact Information: If you have questions or concerns, please call our office, [phone], Monday- Thursday 8AM-5PM and Friday 8AM-12Noon.  If it is after hours or on the weekend, please call [facility], [phone], and ask to speak to the surgeon on call for Dr. Constance Haw at [facility].    Drain Care:  Please keep the drain clean and dry. Replace the gauze/ tape around the drain if it gets dirty or wet/ saturated. Please do not mess with or cut the stitch that is keeping the drain in place. Secure the drain to your clothes so that it does not get dislodged.  You may want to wear a binder around your abdomen (girdle) at night for sleeping if you are worried about it getting pulled out.  Please record the output from the drain daily including the color and the amount in milliliters.  Please keep the drain covered with plastic and tape when you shower so that it does not get wet.   Laparoscopic Cholecystectomy, Care After This sheet gives you information about how to care for yourself after your procedure. Your doctor may also give you more specific instructions. If you have problems or questions, contact your doctor. Follow these instructions at home: Care for cuts from surgery (incisions)   Follow instructions from your doctor about how to take care of your cuts from surgery. Make sure you: ? Wash your hands with soap and water before you change your bandage (dressing). If you cannot use soap and water, use hand sanitizer. ? Change your bandage as told by your doctor. ? Leave stitches (sutures), skin glue, or skin tape (adhesive) strips in place. They may need to stay in place for 2 weeks or longer. If tape strips get loose and curl up, you may trim the loose edges. Do not remove tape strips completely unless your doctor says it is okay.  Do not take baths, swim, or use a hot tub until your doctor says it is okay.   You may shower.   Check your surgical cut area every day for signs of infection. Check for: ? More redness, swelling, or pain. ? More fluid or blood. ? Warmth. ? Pus or a bad smell. Activity  Do not drive or use heavy machinery while taking prescription pain medicine.  Do not lift anything that is heavier than 10 lb (4.5 kg) until your doctor says it is  okay.  Do not play contact sports until your doctor says it is okay.  Do not drive for 24 hours if you were given a medicine to help you relax (sedative).  Rest as needed. Do not return to work or school until your doctor says it is okay. General instructions  Take over-the-counter and prescription medicines only as told by your doctor.  To prevent or treat constipation while you are taking prescription pain medicine, your doctor may recommend that you: ? Drink enough fluid to keep your pee (urine) clear or pale yellow. ? Take over-the-counter or prescription medicines. ? Eat foods that are high in fiber, such as fresh fruits and vegetables, whole grains, and beans. ? Limit foods that are high in fat and processed sugars, such as fried and sweet foods. Contact a doctor if:  You develop a rash.  You have more redness, swelling, or pain around your surgical cuts.  You have more  fluid or blood coming from your surgical cuts.  Your surgical cuts feel warm to the touch.  You have pus or a bad smell coming from your surgical cuts.  You have a fever.  One or more of your surgical cuts breaks open. Get help right away if:  You have trouble breathing.  You have chest pain.  You have pain that is getting worse in your shoulders.  You faint or feel dizzy when you stand.  You have very bad pain in your belly (abdomen).  You are sick to your stomach (nauseous) for more than one day.  You have throwing up (vomiting) that lasts for more than one day.  You have leg pain. This information is not intended to replace advice given to you by your health care provider. Make sure you discuss any questions you have with your health care provider. Document Revised: 07/26/2017 Document Reviewed: 01/30/2016 Elsevier Patient Education  [place].   Diabetes Basics  Diabetes (diabetes mellitus) is a long-term (chronic) disease. It occurs when the body does not properly use sugar  (glucose) that is released from food after you eat. Diabetes may be caused by one or both of these problems:  Your pancreas does not make enough of a hormone called insulin.  Your body does not react in a normal way to insulin that it makes. Insulin lets sugars (glucose) go into cells in your body. This gives you energy. If you have diabetes, sugars cannot get into cells. This causes high blood sugar (hyperglycemia). Follow these instructions at home: How is diabetes treated? You may need to take insulin or other diabetes medicines daily to keep your blood sugar in balance. Take your diabetes medicines every day as told by your doctor. List your diabetes medicines here: Diabetes medicines  Name of medicine: ______________________________ ? Amount (dose): _______________ Time (a.m./p.m.): _______________ Notes: ___________________________________  Name of medicine: ______________________________ ? Amount (dose): _______________ Time (a.m./p.m.): _______________ Notes: ___________________________________  Name of medicine: ______________________________ ? Amount (dose): _______________ Time (a.m./p.m.): _______________ Notes: ___________________________________ If you use insulin, you will learn how to give yourself insulin by injection. You may need to adjust the amount based on the food that you eat. List the types of insulin you use here: Insulin  Insulin type: ______________________________ ? Amount (dose): _______________ Time (a.m./p.m.): _______________ Notes: ___________________________________  Insulin type: ______________________________ ? Amount (dose): _______________ Time (a.m./p.m.): _______________ Notes: ___________________________________  Insulin type: ______________________________ ? Amount (dose): _______________ Time (a.m./p.m.): _______________ Notes: ___________________________________  Insulin type: ______________________________ ? Amount (dose): _______________  Time (a.m./p.m.): _______________ Notes: ___________________________________  Insulin type: ______________________________ ? Amount (dose): _______________ Time (a.m./p.m.): _______________ Notes: ___________________________________ How do I manage my blood sugar?  Check your blood sugar levels using a blood glucose monitor as directed by your doctor. Your doctor will set treatment goals for you. Generally, you should have these blood sugar levels:  Before meals (preprandial): 80-130 mg/dL (4.4-7.2 mmol/L).  After meals (postprandial): below 180 mg/dL (10 mmol/L).  A1c level: less than 7%. Write down the times that you will check your blood sugar levels: Blood sugar checks  Time: _______________ Notes: ___________________________________  Time: _______________ Notes: ___________________________________  Time: _______________ Notes: ___________________________________  Time: _______________ Notes: ___________________________________  Time: _______________ Notes: ___________________________________  Time: _______________ Notes: ___________________________________  What do I need to know about low blood sugar? Low blood sugar is called hypoglycemia. This is when blood sugar is at or below 70 mg/dL (3.9 mmol/L). Symptoms may include:  Feeling: ? Hungry. ? Worried or  nervous (anxious). ? Sweaty and clammy. ? Confused. ? Dizzy. ? Sleepy. ? Sick to your stomach (nauseous).  Having: ? A fast heartbeat. ? A headache. ? A change in your vision. ? Tingling or no feeling (numbness) around the mouth, lips, or tongue. ? Jerky movements that you cannot control (seizure).  Having trouble with: ? Moving (coordination). ? Sleeping. ? Passing out (fainting). ? Getting upset easily (irritability). Treating low blood sugar To treat low blood sugar, eat or drink something sugary right away. If you can think clearly and swallow safely, follow the 15:15 rule:  Take 15 grams of a  fast-acting carb (carbohydrate). Talk with your doctor about how much you should take.  Some fast-acting carbs are: ? Sugar tablets (glucose pills). Take 3-4 glucose pills. ? 6-8 pieces of hard candy. ? 4-6 oz (120-150 mL) of fruit juice. ? 4-6 oz (120-150 mL) of regular (not diet) soda. ? 1 Tbsp (15 mL) honey or sugar.  Check your blood sugar 15 minutes after you take the carb.  If your blood sugar is still at or below 70 mg/dL (3.9 mmol/L), take 15 grams of a carb again.  If your blood sugar does not go above 70 mg/dL (3.9 mmol/L) after 3 tries, get help right away.  After your blood sugar goes back to normal, eat a meal or a snack within 1 hour. Treating very low blood sugar If your blood sugar is at or below 54 mg/dL (3 mmol/L), you have very low blood sugar (severe hypoglycemia). This is an emergency. Do not wait to see if the symptoms will go away. Get medical help right away. Call your local emergency services (911 in the [place] Do not drive yourself to the hospital. Questions to ask your health care provider  Do I need to meet with a diabetes educator?  What equipment will I need to care for myself at home?  What diabetes medicines do I need? When should I take them?  How often do I need to check my blood sugar?  What number can I call if I have questions?  When is my next doctor's visit?  Where can I find a support group for people with diabetes? Where to find more information  American Diabetes Association: www.diabetes.org  American Association of Diabetes Educators: www.diabeteseducator.org/patient-resources Contact a doctor if:  Your blood sugar is at or above 240 mg/dL (13.3 mmol/L) for 2 days in a row.  You have been sick or have had a fever for 2 days or more, and you are not getting better.  You have any of these problems for more than 6 hours: ? You cannot eat or drink. ? You feel sick to your stomach (nauseous). ? You throw up (vomit). ? You have  watery poop (diarrhea). Get help right away if:  Your blood sugar is lower than 54 mg/dL (3 mmol/L).  You get confused.  You have trouble: ? Thinking clearly. ? Breathing. Summary  Diabetes (diabetes mellitus) is a long-term (chronic) disease. It occurs when the body does not properly use sugar (glucose) that is released from food after digestion.  Take insulin and diabetes medicines as told.  Check your blood sugar every day, as often as told.  Keep all follow-up visits as told by your doctor. This is important. This information is not intended to replace advice given to you by your health care provider. Make sure you discuss any questions you have with your health care provider. Document Revised: 05/06/2019 Document Reviewed: 11/15/2017  Elsevier Patient Education  [place].  Insulin Glargine injection What is this medicine? INSULIN GLARGINE (IN su lin GLAR geen) is a human-made form of insulin. This drug lowers the amount of sugar in your blood. It is a long-acting insulin that is usually given once a day. This medicine may be used for other purposes; ask your health care provider or pharmacist if you have questions. COMMON BRAND NAME(S): BASAGLAR, Lantus, Lantus SoloStar, Semglee, Toujeo Max SoloStar, [employer] What should I tell my health care provider before I take this medicine? They need to know if you have any of these conditions:  episodes of low blood sugar  eye disease, vision problems  kidney disease  liver disease  an unusual or allergic reaction to insulin, metacresol, other medicines, foods, dyes, or preservatives  pregnant or trying to get pregnant  breast-feeding How should I use this medicine? This medicine is for injection under the skin. Use this medicine at the same time each day. Use exactly as directed. This insulin should never be mixed in the same syringe with other insulins before injection. Do not vigorously shake before use. You  will be taught how to use this medicine and how to adjust doses for activities and illness. Do not use more insulin than prescribed. Always check the appearance of your insulin before using it. This medicine should be clear and colorless like water. Do not use it if it is cloudy, thickened, colored, or has solid particles in it. If you use an insulin pen, be sure to take off the outer needle cover before using the dose. It is important that you put your used needles and syringes in a special sharps container. Do not put them in a trash can. If you do not have a sharps container, call your pharmacist or healthcare provider to get one. This drug comes with INSTRUCTIONS FOR USE. Ask your pharmacist for directions on how to use this drug. Read the information carefully. Talk to your pharmacist or health care provider if you have questions. Talk to your pediatrician regarding the use of this medicine in children. While this drug may be prescribed for children as young as 6 years for selected conditions, precautions do apply. Overdosage: If you think you have taken too much of this medicine contact a poison control center or emergency room at once. NOTE: This medicine is only for you. Do not share this medicine with others. What if I miss a dose? It is important not to miss a dose. Your health care professional or doctor should discuss a plan for missed doses with you. If you do miss a dose, follow their plan. Do not take double doses. What may interact with this medicine?  other medicines for diabetes Many medications may cause changes in blood sugar, these include:  alcohol containing beverages  antiviral medicines for HIV or AIDS  aspirin and aspirin-like drugs  certain medicines for blood pressure, heart disease, irregular heart beat  chromium  diuretics  male hormones, such as estrogens or progestins, birth control pills  fenofibrate  gemfibrozil  isoniazid  lanreotide  male  hormones or anabolic steroids  MAOIs like Carbex, Eldepryl, Marplan, Nardil, and Parnate  medicines for weight loss  medicines for allergies, asthma, cold, or cough  medicines for depression, anxiety, or psychotic disturbances  niacin  nicotine  NSAIDs, medicines for pain and inflammation, like ibuprofen or naproxen  octreotide  pasireotide  pentamidine  phenytoin  probenecid  quinolone antibiotics such as ciprofloxacin, levofloxacin,  ofloxacin  some herbal dietary supplements  steroid medicines such as prednisone or cortisone  sulfamethoxazole; trimethoprim  thyroid hormones Some medications can hide the warning symptoms of low blood sugar (hypoglycemia). You may need to monitor your blood sugar more closely if you are taking one of these medications. These include:  beta-blockers, often used for high blood pressure or heart problems (examples include atenolol, metoprolol, propranolol)  clonidine  guanethidine  reserpine This list may not describe all possible interactions. Give your health care provider a list of all the medicines, herbs, non-prescription drugs, or dietary supplements you use. Also tell them if you smoke, drink alcohol, or use illegal drugs. Some items may interact with your medicine. What should I watch for while using this medicine? Visit your health care professional or doctor for regular checks on your progress. Do not drive, use machinery, or do anything that needs mental alertness until you know how this medicine affects you. Alcohol may interfere with the effect of this medicine. Avoid alcoholic drinks. A test called the HbA1C (A1C) will be monitored. This is a simple blood test. It measures your blood sugar control over the last 2 to 3 months. You will receive this test every 3 to 6 months. Learn how to check your blood sugar. Learn the symptoms of low and high blood sugar and how to manage them. Always carry a quick-source of sugar with  you in case you have symptoms of low blood sugar. Examples include hard sugar candy or glucose tablets. Make sure others know that you can choke if you eat or drink when you develop serious symptoms of low blood sugar, such as seizures or unconsciousness. They must get medical help at once. Tell your doctor or health care professional if you have high blood sugar. You might need to change the dose of your medicine. If you are sick or exercising more than usual, you might need to change the dose of your medicine. Do not skip meals. Ask your doctor or health care professional if you should avoid alcohol. Many nonprescription cough and cold products contain sugar or alcohol. These can affect blood sugar. Make sure that you have the right kind of syringe for the type of insulin you use. Try not to change the brand and type of insulin or syringe unless your health care professional or doctor tells you to. Switching insulin brand or type can cause dangerously high or low blood sugar. Always keep an extra supply of insulin, syringes, and needles on hand. Use a syringe one time only. Throw away syringe and needle in a closed container to prevent accidental needle sticks. Insulin pens and cartridges should never be shared. Even if the needle is changed, sharing may result in passing of viruses like hepatitis or HIV. Each time you get a new box of pen needles, check to see if they are the same type as the ones you were trained to use. If not, ask your health care professional to show you how to use this new type properly. Wear a medical ID bracelet or chain, and carry a card that describes your disease and details of your medicine and dosage times. What side effects may I notice from receiving this medicine? Side effects that you should report to your doctor or health care professional as soon as possible:  allergic reactions like skin rash, itching or hives, swelling of the face, lips, or tongue  breathing  problems  signs and symptoms of high blood sugar such as dizziness,  dry mouth, dry skin, fruity breath, nausea, stomach pain, increased hunger or thirst, increased urination  signs and symptoms of low blood sugar such as feeling anxious, confusion, dizziness, increased hunger, unusually weak or tired, sweating, shakiness, cold, irritable, headache, blurred vision, fast heartbeat, loss of consciousness Side effects that usually do not require medical attention (report to your doctor or health care professional if they continue or are bothersome):  increase or decrease in fatty tissue under the skin due to overuse of a particular injection site  itching, burning, swelling, or rash at site where injected This list may not describe all possible side effects. Call your doctor for medical advice about side effects. You may report side effects to FDA at 1-800-FDA-1088. Where should I keep my medicine? Keep out of the reach of children. Unopened Vials: Lantus vials: Store in a refrigerator between 2 and 8 degrees C (36 and 46 degrees F) or at room temperature below 30 degrees C (86 degrees F). Do not freeze or use if the insulin has been frozen. Protect from light and excessive heat. If stored at room temperature, the vial must be discarded after 28 days. Throw away any unopened and unused medicine that has been stored in the refrigerator after the expiration date. Unopened Pens: Neurosurgeon: Store in a refrigerator between 2 and 8 degrees C (36 and 46 degrees F) or at room temperature below 30 degrees C (86 degrees F). Do not freeze or use if the insulin has been frozen. Protect from light and excessive heat. If stored at room temperature, the pen must be discarded after 28 days. Throw away any unopened and unused medicine that has been stored in the refrigerator after the expiration date. Lantus Solostar Pens: Store in a refrigerator between 2 and 8 degrees C (36 and 46 degrees F) or at room  temperature below 30 degrees C (86 degrees F). Do not freeze or use if the insulin has been frozen. Protect from light and excessive heat. If stored at room temperature, the pen must be discarded after 28 days. Throw away any unopened and unused medicine that has been stored in the refrigerator after the expiration date. Semglee Pens: Store in a refrigerator between 2 and 8 degrees C (36 and 46 degrees F) or at room temperature below 30 degrees C (86 degrees F). Do not freeze or use if the insulin has been frozen. Protect from light and excessive heat. If stored at room temperature, the pen must be discarded after 28 days. Throw away any unopened and unused medicine that has been stored in the refrigerator after the expiration date. Toujeo Solostar Pens or Toujeo Max [employer] Pens: Store in a refrigerator between 2 and 8 degrees C (36 and 46 degrees F). Do not freeze or use if the insulin has been frozen. Protect from light and excessive heat. Throw away any unopened and unused medicine that has been stored in the refrigerator after the expiration date. Vials that you are using: Lantus vials: Store in a refrigerator or at room temperature below 30 degrees C (86 degrees F). Do not freeze. Keep away from heat and light. Throw the opened vial away after 28 days. Semglee vials: Store in a refrigerator or at room temperature below 30 degrees C (86 degrees F). Do not freeze. Keep away from heat and light. Throw the opened vial away after 28 days. Pens that you are using: Basaglar KwikPens: Store at room temperature below 30 degrees C (86 degrees F). Do not refrigerate  or freeze. Keep away from heat and light. Throw the pen away after 28 days, even if it still has insulin left in it. Lantus Solostar Pens: Store at room temperature below 30 degrees C (86 degrees F). Do not refrigerate or freeze. Keep away from heat and light. Throw the pen away after 28 days, even if it still has insulin left in it. Semglee Pens:  Store at room temperature below 30 degrees C (86 degrees F). Do not refrigerate or freeze. Keep away from heat and light. Throw the pen away after 28 days, even if it still has insulin left in it. Toujeo Solostar Pens or Toujeo [employer] Pens: Store at room temperature below 30 degrees C (86 degrees F). Do not refrigerate or freeze. Keep away from heat and light. Throw the pen away after 56 days, even if it still has insulin left in it. NOTE: This sheet is a summary. It may not cover all possible information. If you have questions about this medicine, talk to your doctor, pharmacist, or health care provider.  2020 Elsevier/Gold Standard (2019-06-03 15:00:23)

## 2020-08-15 LAB — SURGICAL PATHOLOGY

## 2020-08-16 ENCOUNTER — Other Ambulatory Visit: Payer: Self-pay | Admitting: Family Medicine

## 2020-08-16 ENCOUNTER — Other Ambulatory Visit: Payer: Self-pay

## 2020-08-16 ENCOUNTER — Ambulatory Visit: Payer: Self-pay | Admitting: General Surgery

## 2020-08-16 ENCOUNTER — Encounter: Payer: Self-pay | Admitting: General Surgery

## 2020-08-16 ENCOUNTER — Ambulatory Visit (INDEPENDENT_AMBULATORY_CARE_PROVIDER_SITE_OTHER): Payer: No Typology Code available for payment source | Admitting: General Surgery

## 2020-08-16 VITALS — BP 114/76 | HR 80 | Temp 98.1°F | Resp 18 | Ht 66.0 in | Wt 273.0 lb

## 2020-08-16 DIAGNOSIS — R799 Abnormal finding of blood chemistry, unspecified: Secondary | ICD-10-CM

## 2020-08-16 DIAGNOSIS — K810 Acute cholecystitis: Secondary | ICD-10-CM

## 2020-08-16 MED ORDER — OXYCODONE HCL 5 MG PO TABS: 5.0000 mg | ORAL_TABLET | ORAL | 0 refills | Status: DC | PRN

## 2020-08-16 NOTE — Patient Instructions (Signed)
Take probiotic for diarrhea/ or eat yogurt. Call if diarrhea not improving.  Pain medication as needed, take ibuprofen and tylenol and roxicodone for breakthrough pain. CBC and CMP ordered. Will see back next week and see if ready for work. Activity and diet as tolerated.

## 2020-08-16 NOTE — Progress Notes (Signed)
[facility] Note   HPI:  49 y.o. Male presents to clinic for post-op follow-up evaluation after laparoscopic cholecystectomy. He is still pretty sore. About 30cc of SS output from drain. BS been in the 190s.   Review of Systems:  No fever Soreness  Some looser stools  All other review of systems: otherwise negative   Vital Signs:  BP 114/76   Pulse 80   Temp 98.1 F (36.7 C) (Oral)   Resp 18   Ht 5\' 6"  (1.676 m)   Wt 273 lb (123.8 kg)   SpO2 96%   BMI 44.06 kg/m    Physical Exam:  Physical Exam Vitals reviewed.  Cardiovascular:     Rate and Rhythm: Normal rate.  Pulmonary:     Effort: Pulmonary effort is normal.  Abdominal:     General: There is no distension.     Palpations: Abdomen is soft.     Tenderness: There is abdominal tenderness.     Comments: JP removed, staples removed and steri strips placed except for 1 staple at JP site, bandage in place, no erythema or drainage  Neurological:     Mental Status: He is alert.     Assessment:  49 y.o. yo Male s/p laparoscopic cholecystectomy for necrotizing gangrenous gallbladder. Doing fair.   Plan:  Take probiotic for diarrhea/ or eat yogurt. Call if diarrhea not improving.  Pain medication as needed, take ibuprofen and tylenol and roxicodone for breakthrough pain. CBC and CMP ordered. Will see back next week and see if ready for work. Activity and diet as tolerated.  Future Appointments  Date Time Provider [place]  08/23/2020  9:00 AM Virl Cagey, MD [facility] None     All of the above recommendations were discussed with the patient and patient's family, and all of patient's and family's questions were answered to their expressed satisfaction.  Curlene Labrum, MD [facility] [address] [place], [place] [address] [phone] (office)

## 2020-08-17 ENCOUNTER — Other Ambulatory Visit: Payer: Self-pay

## 2020-08-17 ENCOUNTER — Other Ambulatory Visit (HOSPITAL_COMMUNITY)
Admission: RE | Admit: 2020-08-17 | Discharge: 2020-08-17 | Disposition: A | Discharge status: Home / Self Care | Payer: No Typology Code available for payment source | Source: Ambulatory Visit | Attending: General Surgery | Admitting: General Surgery

## 2020-08-17 DIAGNOSIS — K810 Acute cholecystitis: Secondary | ICD-10-CM | POA: Insufficient documentation

## 2020-08-17 DIAGNOSIS — R799 Abnormal finding of blood chemistry, unspecified: Secondary | ICD-10-CM | POA: Diagnosis present

## 2020-08-17 LAB — COMPREHENSIVE METABOLIC PANEL
ALT: 34 U/L (ref 0–44)
AST: 22 U/L (ref 15–41)
Albumin: 2.9 g/dL — ABNORMAL LOW (ref 3.5–5.0)
Alkaline Phosphatase: 136 U/L — ABNORMAL HIGH (ref 38–126)
Anion gap: 10 (ref 5–15)
BUN: 14 mg/dL (ref 6–20)
CO2: 26 mmol/L (ref 22–32)
Calcium: 8.6 mg/dL — ABNORMAL LOW (ref 8.9–10.3)
Chloride: 97 mmol/L — ABNORMAL LOW (ref 98–111)
Creatinine, Ser: 1.18 mg/dL (ref 0.61–1.24)
GFR, Estimated: >60 mL/min (ref >60)
Glucose, Bld: 167 mg/dL — ABNORMAL HIGH (ref 70–99)
Potassium: 5.1 mmol/L (ref 3.5–5.1)
Sodium: 133 mmol/L — ABNORMAL LOW (ref 135–145)
Total Bilirubin: 0.4 mg/dL (ref 0.3–1.2)
Total Protein: 7.3 g/dL (ref 6.5–8.1)

## 2020-08-17 LAB — CBC WITH DIFFERENTIAL/PLATELET
Abs Immature Granulocytes: 0.33 10*3/uL — ABNORMAL HIGH (ref 0.00–0.07)
Basophils Absolute: 0.1 10*3/uL (ref 0.0–0.1)
Basophils Relative: 1 %
Eosinophils Absolute: 0.3 10*3/uL (ref 0.0–0.5)
Eosinophils Relative: 2 %
HCT: 44.3 % (ref 39.0–52.0)
Hemoglobin: 13.8 g/dL (ref 13.0–17.0)
Immature Granulocytes: 3 %
Lymphocytes Relative: 15 %
Lymphs Abs: 2 10*3/uL (ref 0.7–4.0)
MCH: 27.9 pg (ref 26.0–34.0)
MCHC: 31.2 g/dL (ref 30.0–36.0)
MCV: 89.7 fL (ref 80.0–100.0)
Monocytes Absolute: 0.6 10*3/uL (ref 0.1–1.0)
Monocytes Relative: 5 %
Neutro Abs: 9.5 10*3/uL — ABNORMAL HIGH (ref 1.7–7.7)
Neutrophils Relative %: 74 %
Platelets: 387 10*3/uL (ref 150–400)
RBC: 4.94 MIL/uL (ref 4.22–5.81)
RDW: 12.2 % (ref 11.5–15.5)
WBC: 12.8 10*3/uL — ABNORMAL HIGH (ref 4.0–10.5)
nRBC: 0 % (ref 0.0–0.2)

## 2020-08-18 ENCOUNTER — Telehealth (INDEPENDENT_AMBULATORY_CARE_PROVIDER_SITE_OTHER): Payer: No Typology Code available for payment source | Admitting: General Surgery

## 2020-08-18 DIAGNOSIS — K810 Acute cholecystitis: Secondary | ICD-10-CM

## 2020-08-18 NOTE — Telephone Encounter (Signed)
[facility]  Lab work all reassuring. LFTs down except for slight elevation Alk phos.  Leukocytosis likely just reactive as no shift.   Left patient patient.  And got his GF on the phone who was with him.   Feels weak and tired.  But doing better. No fevers.  If he is having any issues he will call and let us know or go to the ED.  Left lower abdomen is hurting. And is likely from fixing the belly button hernia.  He is having some hemorrhoid issue and having swelling and itching. He is using suppositories and recommended recticare.  Activity as tolerated.  Diet as tolerated. Will see back 12/28.   Curlene Labrum, MD [facility] [address] [place], [place] [address] [phone] (office)

## 2020-08-23 ENCOUNTER — Other Ambulatory Visit: Payer: Self-pay | Admitting: General Surgery

## 2020-08-23 ENCOUNTER — Ambulatory Visit (INDEPENDENT_AMBULATORY_CARE_PROVIDER_SITE_OTHER): Payer: No Typology Code available for payment source | Admitting: General Surgery

## 2020-08-23 ENCOUNTER — Encounter: Payer: Self-pay | Admitting: General Surgery

## 2020-08-23 ENCOUNTER — Other Ambulatory Visit: Payer: Self-pay

## 2020-08-23 ENCOUNTER — Other Ambulatory Visit: Payer: Self-pay | Admitting: Family Medicine

## 2020-08-23 VITALS — BP 137/85 | HR 80 | Temp 98.6°F | Resp 16 | Ht 66.0 in | Wt 275.0 lb

## 2020-08-23 DIAGNOSIS — K810 Acute cholecystitis: Secondary | ICD-10-CM

## 2020-08-23 DIAGNOSIS — R799 Abnormal finding of blood chemistry, unspecified: Secondary | ICD-10-CM

## 2020-08-23 NOTE — Progress Notes (Signed)
[facility] Note   HPI:  49 y.o. Male presents to clinic for post-op follow-up evaluation after laparoscopic cholecystectomy. He had his drain removed last week and is feeling better. Still some upper abdominal tenderness. He has hemorrhoids flaring again from stools but says it is improving. He does not want me to check his hemorrhoids again today. I offered today and last visit.   Review of Systems:  No fever or chills Improving soreness Hemorrhoid pain All other review of systems: otherwise negative   Vital Signs:  BP 137/85   Pulse 80   Temp 98.6 F (37 C) (Oral)   Resp 16   Ht 5\' 6"  (1.676 m)   Wt 275 lb (124.7 kg)   SpO2 97%   BMI 44.39 kg/m    Physical Exam:  Physical Exam Vitals reviewed.  Cardiovascular:     Rate and Rhythm: Normal rate.  Pulmonary:     Effort: Pulmonary effort is normal.  Abdominal:     General: There is no distension.     Palpations: Abdomen is soft.     Tenderness: There is no abdominal tenderness.     Comments: Port sites healing, last staple removed, steri strips in place, no erythema or drainage  Neurological:     General: No focal deficit present.     Mental Status: He is alert and oriented to person, place, and time.      Assessment:  49 y.o. yo Male with recent laparoscopic cholecystectomy for gangrenous cholecystitis. Doing better.  Plan:  Can remove strips in the shower. Lab work to be repeated. Referral to PCP, Dr. 49 for diabetes management.  Return to work without restrictions 08/30/2019.   All of the above recommendations were discussed with the patient and patient's family, and all of patient's and family's questions were answered to their expressed satisfaction.  10/28/2019, MD [facility] [address] [place], [place] [phone] (office)

## 2020-08-23 NOTE — Patient Instructions (Signed)
Can remove strips in the shower. Lab work to be repeated. Referral to PCP, Dr. Karilyn Cota. Return to work without restrictions 08/30/2019.

## 2020-08-24 LAB — CBC WITH DIFFERENTIAL/PLATELET
Basophils Absolute: 0.1 10*3/uL (ref 0.0–0.2)
Basos: 1 %
EOS (ABSOLUTE): 0.2 10*3/uL (ref 0.0–0.4)
Eos: 2 %
Hematocrit: 42.3 % (ref 37.5–51.0)
Hemoglobin: 13.4 g/dL (ref 13.0–17.7)
Immature Grans (Abs): 0.1 10*3/uL (ref 0.0–0.1)
Immature Granulocytes: 1 %
Lymphocytes Absolute: 1.7 10*3/uL (ref 0.7–3.1)
Lymphs: 20 %
MCH: 27.1 pg (ref 26.6–33.0)
MCHC: 31.7 g/dL (ref 31.5–35.7)
MCV: 86 fL (ref 79–97)
Monocytes Absolute: 0.5 10*3/uL (ref 0.1–0.9)
Monocytes: 6 %
Neutrophils Absolute: 5.9 10*3/uL (ref 1.4–7.0)
Neutrophils: 70 %
Platelets: 342 10*3/uL (ref 150–450)
RBC: 4.94 x10E6/uL (ref 4.14–5.80)
RDW: 12.2 % (ref 11.6–15.4)
WBC: 8.3 10*3/uL (ref 3.4–10.8)

## 2020-08-24 LAB — COMPREHENSIVE METABOLIC PANEL
ALT: 28 IU/L (ref 0–44)
AST: 18 IU/L (ref 0–40)
Albumin/Globulin Ratio: 1.1 — ABNORMAL LOW (ref 1.2–2.2)
Albumin: 3.5 g/dL — ABNORMAL LOW (ref 4.0–5.0)
Alkaline Phosphatase: 145 IU/L — ABNORMAL HIGH (ref 44–121)
BUN/Creatinine Ratio: 12 (ref 9–20)
BUN: 13 mg/dL (ref 6–24)
Bilirubin Total: 0.2 mg/dL (ref 0.0–1.2)
CO2: 28 mmol/L (ref 20–29)
Calcium: 9.2 mg/dL (ref 8.7–10.2)
Chloride: 99 mmol/L (ref 96–106)
Creatinine, Ser: 1.13 mg/dL (ref 0.76–1.27)
GFR calc Af Amer: 88 mL/min/{1.73_m2} (ref >59)
GFR calc non Af Amer: 76 mL/min/{1.73_m2} (ref >59)
Globulin, Total: 3.2 g/dL (ref 1.5–4.5)
Glucose: 172 mg/dL — ABNORMAL HIGH (ref 65–99)
Potassium: 5.2 mmol/L (ref 3.5–5.2)
Sodium: 140 mmol/L (ref 134–144)
Total Protein: 6.7 g/dL (ref 6.0–8.5)

## 2020-08-25 ENCOUNTER — Telehealth (INDEPENDENT_AMBULATORY_CARE_PROVIDER_SITE_OTHER): Payer: No Typology Code available for payment source | Admitting: General Surgery

## 2020-08-25 DIAGNOSIS — K810 Acute cholecystitis: Secondary | ICD-10-CM

## 2020-08-25 NOTE — Telephone Encounter (Signed)
Results for FENDER, HERDER (MRN [id number]) as of 08/25/2020 14:57  Ref. Range 08/23/2020 11:15  Sodium Latest Ref Range: 134 - 144 mmol/L 140  Potassium Latest Ref Range: 3.5 - 5.2 mmol/L 5.2  Chloride Latest Ref Range: 96 - 106 mmol/L 99  CO2 Latest Ref Range: 20 - 29 mmol/L 28  Glucose Latest Ref Range: 65 - 99 mg/dL 163 (H)  BUN Latest Ref Range: 6 - 24 mg/dL 13  Creatinine Latest Ref Range: 0.76 - 1.27 mg/dL 8.46  Calcium Latest Ref Range: 8.7 - 10.2 mg/dL 9.2  BUN/Creatinine Ratio Latest Ref Range: 9 - 20  12  Alkaline Phosphatase Latest Ref Range: 44 - 121 IU/L 145 (H)  Albumin Latest Ref Range: 4.0 - 5.0 g/dL 3.5 (L)  Albumin/Globulin Ratio Latest Ref Range: 1.2 - 2.2  1.1 (L)  AST Latest Ref Range: 0 - 40 IU/L 18  ALT Latest Ref Range: 0 - 44 IU/L 28  Total Protein Latest Ref Range: 6.0 - 8.5 g/dL 6.7  Total Bilirubin Latest Ref Range: 0.0 - 1.2 mg/dL 0.2  GFR, Est Non African American Latest Ref Range: >59 mL/min/1.73 76  GFR, Est African American Latest Ref Range: >59 mL/min/1.73 88  Globulin, Total Latest Ref Range: 1.5 - 4.5 g/dL 3.2  WBC Latest Ref Range: 3.4 - 10.8 x10E3/uL 8.3  RBC Latest Ref Range: 4.14 - 5.80 x10E6/uL 4.94  Hemoglobin Latest Ref Range: 13.0 - 17.7 g/dL 65.9  HCT Latest Ref Range: 37.5 - 51.0 % 42.3  MCV Latest Ref Range: 79 - 97 fL 86  MCH Latest Ref Range: 26.6 - 33.0 pg 27.1  MCHC Latest Ref Range: 31.5 - 35.7 g/dL 93.5  RDW Latest Ref Range: 11.6 - 15.4 % 12.2  Platelets Latest Ref Range: 150 - 450 x10E3/uL 342   Patient is set up to see Jiles Prows on 08/31/2020.  The Alkaline phosphatase is still slightly elevated. Will repeat in like 6-8 weeks. Likely all from inflammation post op.   He is still sore on the right and into the right back which is not unexpected given his gallbladder.  Will call him back in February to remind him about getting lab work. If remains elevated may have to do imaging /discuss with GI their thoughts.   Algis Greenhouse, MD [facility] [address] [place], [place] [address] [phone] (office)

## 2020-08-31 ENCOUNTER — Encounter (INDEPENDENT_AMBULATORY_CARE_PROVIDER_SITE_OTHER): Payer: Self-pay | Admitting: Nurse Practitioner

## 2020-08-31 ENCOUNTER — Ambulatory Visit (INDEPENDENT_AMBULATORY_CARE_PROVIDER_SITE_OTHER): Payer: No Typology Code available for payment source | Admitting: Nurse Practitioner

## 2020-08-31 ENCOUNTER — Other Ambulatory Visit: Payer: Self-pay

## 2020-08-31 VITALS — BP 144/84 | HR 79 | Temp 97.7°F | Ht 69.0 in | Wt 277.2 lb

## 2020-08-31 DIAGNOSIS — E1169 Type 2 diabetes mellitus with other specified complication: Secondary | ICD-10-CM | POA: Insufficient documentation

## 2020-08-31 DIAGNOSIS — F419 Anxiety disorder, unspecified: Secondary | ICD-10-CM | POA: Insufficient documentation

## 2020-08-31 DIAGNOSIS — E1165 Type 2 diabetes mellitus with hyperglycemia: Secondary | ICD-10-CM

## 2020-08-31 DIAGNOSIS — M549 Dorsalgia, unspecified: Secondary | ICD-10-CM

## 2020-08-31 DIAGNOSIS — K649 Unspecified hemorrhoids: Secondary | ICD-10-CM | POA: Diagnosis not present

## 2020-08-31 DIAGNOSIS — R748 Abnormal levels of other serum enzymes: Secondary | ICD-10-CM

## 2020-08-31 DIAGNOSIS — F410 Panic disorder [episodic paroxysmal anxiety] without agoraphobia: Secondary | ICD-10-CM | POA: Insufficient documentation

## 2020-08-31 MED ORDER — INSULIN GLARGINE 100 UNIT/ML SOLOSTAR PEN
20.0000 [IU] | PEN_INJECTOR | Freq: Every day | SUBCUTANEOUS | 11 refills | Status: DC
Start: 2020-08-31 — End: 2021-01-16

## 2020-08-31 MED ORDER — INSULIN PEN NEEDLE 32G X 4 MM MISC: 1.0000 | Freq: Every day | 3 refills | Status: DC

## 2020-08-31 MED ORDER — HYDROCORTISONE ACETATE 25 MG RE SUPP: 25.0000 mg | Freq: Two times a day (BID) | RECTAL | 0 refills | Status: DC

## 2020-08-31 NOTE — Progress Notes (Signed)
Subjective:  Patient ID: Gary Thompson, male    DOB: 09/27/1970  Age: 50 y.o. MRN: [id number]  CC:  Chief Complaint  Patient presents with  . Establish Care    Has some issues to discuss, back and liver issue, hemorrhoids are worse      HPI  This patient arrives today for the above.  He is here to establish care today.  He was recently hospitalized for cholecystitis.  He did undergo a laparoscopic cholecystectomy.  He was discharged on 08/12/2020.  Upon following up with the surgeon it was noted that his alkaline phosphatase levels were slightly elevated.  Last note from 08/25/2020 recommends rechecking levels in 6 to 8 weeks.  Today, patient tells me he is not having any significant abdominal pain, nausea, vomiting, generalized itching, skin color changes, or eye color changes.  Patient does continue to have some mild discomfort to the right side of his back.  He tells me this is where his JP drain was placed and it was quite painful when it was in place.  Per chart review it appears that that was removed approximately 2 weeks ago.  He also has painful hemorrhoids and is wondering what he can take to treat these.  He has already been using over-the-counter Preparation H as well as stool softeners.  He tells me his stools have been going in between loose and hard.  He thinks this is what is been irritating his hemorrhoids.  He does have a history of type 2 diabetes which he tells me he was diagnosed nearly a decade ago for this.  He was started on Lantus in addition to his glipizide when he was in the hospital for his cholecystectomy.  Last A1c is greater than 9.  He is tolerating the Lantus well and tells me that when his blood sugar gets down to low 100s he starts to feel hypoglycemic, but denies any blood sugars lower than that.  He also mentions to me that he does have anxiety and takes clonazepam as needed.  He tells me he takes this maybe once a week or less.  Past Medical History:   Diagnosis Date  . Anxiety   . Diabetes mellitus without complication ([place])   . Vertigo       Family History  Problem Relation Age of Onset  . Hypertension Mother   . Diabetes Mother   . Hypertension Father   . Diabetes Father     Social History   Social History Narrative  . Not on file   Social History   Tobacco Use  . Smoking status: Never Smoker  . Smokeless tobacco: Never Used  Substance Use Topics  . Alcohol use: Yes    Comment: occ     Current Meds  Medication Sig  . aspirin-sod bicarb-citric acid (ALKA-SELTZER) 325 MG TBEF tablet Take 325 mg by mouth every 6 (six) hours as needed.  . clonazePAM (KLONOPIN) 1 MG tablet Take 1 tablet by mouth daily as needed.  . docusate sodium (COLACE) 100 MG capsule Take 100 mg by mouth 2 (two) times daily.  Marland Kitchen glipiZIDE (GLUCOTROL) 10 MG tablet Take 1 tablet by mouth 2 (two) times daily.  . hydrocortisone (ANUSOL-HC) 25 MG suppository Place 1 suppository (25 mg total) rectally 2 (two) times daily.  . insulin glargine (LANTUS) 100 UNIT/ML Solostar Pen Inject 20 Units into the skin daily.  . Insulin Pen Needle 32G X 4 MM MISC 1 each by Does not apply route  daily.  . omeprazole (PRILOSEC) 20 MG capsule Take 1 tablet by mouth daily.  . ondansetron (ZOFRAN-ODT) 4 MG disintegrating tablet Take 1 tablet (4 mg total) by mouth every 6 (six) hours as needed for nausea.  Marland Kitchen oxyCODONE (OXY IR/ROXICODONE) 5 MG immediate release tablet Take 1-2 tablets (5-10 mg total) by mouth every 4 (four) hours as needed for severe pain or breakthrough pain.  . [DISCONTINUED] insulin glargine (LANTUS) 100 UNIT/ML injection Inject 0.2 mLs (20 Units total) into the skin daily.    ROS:  Review of Systems  Constitutional: Negative for chills, fever and malaise/fatigue.  Cardiovascular: Negative for chest pain.  Gastrointestinal: Positive for abdominal pain (tender to touch from surgery), blood in stool and diarrhea. Negative for nausea and vomiting.  Skin:  Positive for itching.  Psychiatric/Behavioral: The patient is nervous/anxious.      Objective:   Today's Vitals: BP (!) 144/84   Pulse 79   Temp 97.7 F (36.5 C) (Temporal)   Ht 5\' 9"  (1.753 m)   Wt 277 lb 3.2 oz (125.7 kg)   SpO2 99%   BMI 40.94 kg/m  Vitals with BMI 08/31/2020 08/23/2020 08/16/2020  Height 5\' 9"  5\' 6"  5\' 6"   Weight 277 lbs 3 oz 275 lbs 273 lbs  BMI 40.92 123XX123 [id number]  Systolic 123456 [id number]  Diastolic 84 85 76  Pulse 79 80 80     Physical Exam Vitals reviewed.  Constitutional:      Appearance: Normal appearance.  HENT:     Head: Normocephalic and atraumatic.  Cardiovascular:     Rate and Rhythm: Normal rate and regular rhythm.  Pulmonary:     Effort: Pulmonary effort is normal.     Breath sounds: Normal breath sounds.  Musculoskeletal:     Cervical back: Neck supple.  Skin:    General: Skin is warm and dry.  Neurological:     Mental Status: He is alert and oriented to person, place, and time.  Psychiatric:        Mood and Affect: Mood normal.        Behavior: Behavior normal.        Thought Content: Thought content normal.        Judgment: Judgment normal.          Assessment and Plan   1. Hemorrhoids, unspecified hemorrhoid type   2. Type 2 diabetes mellitus with hyperglycemia, unspecified whether long term insulin use (HCC)   3. Elevated liver enzymes   4. Other acute back pain   5. Anxiety      Plan: 1.  We will prescribe Anusol suppository that he can use twice a day as needed.  Also encouraged him to consider trying Tucks pads over-the-counter.  We also discussed about possibility of surgical treatment but he wants to hold off on this if possible.  We did discuss eating a bland diet for now to hopefully help regulate his bowels.  We will try to make these changes. 2.  He will continue on his current medications as prescribed we will follow closely and discuss this further at next office visit.  He did request prescription for  Lantus pens and I will order this for him today. 3.  We will recheck blood work next office visit in the meantime he was told to watch for any nausea, abdominal pain, vomiting, skin color changes or eye color changes, and/or generalized itching.  He was told these were to occur to let me know by calling the  office or sending  my chart.  He tells me he understands.  4.  I think this may be residual pain from healing from the surgery as well as J-tube placement.  For now I recommended he continue taking his pain medicine as prescribed and/or ibuprofen up to 3 times a day with food.  If pain persists at next office visit may consider further evaluation. 4.  We will continue on his clonazepam as needed.  Tests ordered No orders of the defined types were placed in this encounter.     Meds ordered this encounter  Medications  . hydrocortisone (ANUSOL-HC) 25 MG suppository    Sig: Place 1 suppository (25 mg total) rectally 2 (two) times daily.    Dispense:  12 suppository    Refill:  0    Order Specific Question:   Supervising Provider    Answer:   Karilyn Cota, NIMISH C [[id number]]  . insulin glargine (LANTUS) 100 UNIT/ML Solostar Pen    Sig: Inject 20 Units into the skin daily.    Dispense:  15 mL    Refill:  11    Order Specific Question:   Supervising Provider    Answer:   Lilly Cove C [[id number]]  . Insulin Pen Needle 32G X 4 MM MISC    Sig: 1 each by Does not apply route daily.    Dispense:  90 each    Refill:  3    Order Specific Question:   Supervising Provider    Answer:   Wilson Singer [[id number]]    Patient to follow-up in 1 month or sooner, as needed.  Elenore Paddy, NP

## 2020-08-31 NOTE — Patient Instructions (Signed)
[employer]

## 2020-09-06 ENCOUNTER — Other Ambulatory Visit (INDEPENDENT_AMBULATORY_CARE_PROVIDER_SITE_OTHER): Payer: Self-pay | Admitting: Nurse Practitioner

## 2020-09-06 DIAGNOSIS — K649 Unspecified hemorrhoids: Secondary | ICD-10-CM

## 2020-10-05 ENCOUNTER — Encounter (INDEPENDENT_AMBULATORY_CARE_PROVIDER_SITE_OTHER): Payer: Self-pay | Admitting: Nurse Practitioner

## 2020-10-05 ENCOUNTER — Other Ambulatory Visit: Payer: Self-pay

## 2020-10-05 ENCOUNTER — Ambulatory Visit (INDEPENDENT_AMBULATORY_CARE_PROVIDER_SITE_OTHER): Payer: No Typology Code available for payment source | Admitting: Nurse Practitioner

## 2020-10-05 VITALS — BP 150/86 | HR 90 | Temp 97.2°F | Resp 16 | Ht 66.0 in | Wt 288.0 lb

## 2020-10-05 DIAGNOSIS — R748 Abnormal levels of other serum enzymes: Secondary | ICD-10-CM

## 2020-10-05 DIAGNOSIS — Z1159 Encounter for screening for other viral diseases: Secondary | ICD-10-CM

## 2020-10-05 DIAGNOSIS — K649 Unspecified hemorrhoids: Secondary | ICD-10-CM | POA: Diagnosis not present

## 2020-10-05 DIAGNOSIS — E1165 Type 2 diabetes mellitus with hyperglycemia: Secondary | ICD-10-CM

## 2020-10-05 NOTE — Progress Notes (Signed)
Subjective:  Patient ID: Gary Thompson, male    DOB: [birth year]  Age: 50 y.o. MRN: [id number]  CC:  Chief Complaint  Patient presents with  . Hemorrhoids  . Diabetes    Wants to discuss insulin       HPI  This patient arrives today for the above.  Hemorrhoids: At last office visit he had mentioned his hemorrhoids were bothering him so we have prescribed Anusol suppositories and since using these he tells me that his symptoms have resolved.  Type 2 diabetes: Last A1c was 9.8.  He continues on Lantus, he tells me has been experiencing a mild burning sensation when Lantus is administered.  He is also on glipizide 10 mg twice a day.  He is due for foot exam today, he tells me he has had his eye exam within the last couple of months.  He is not on ACE, ARB, or statin at this time.  Elevated liver enzymes: He had his gallbladder removed approximately 2 months ago.  Since then his alkaline phosphatase has remained mildly elevated.  He is due to have these rechecked today.  He tells me he continues to have intermittent abdominal pain and he does find it difficult to lay down on his right side.  He denies any nausea, vomiting, skin changes, diarrhea, or blood in his stool.  Past Medical History:  Diagnosis Date  . Anxiety   . Diabetes mellitus without complication ([place])   . Vertigo       Family History  Problem Relation Age of Onset  . Hypertension Mother   . Diabetes Mother   . Hypertension Father   . Diabetes Father     Social History   Social History Narrative  . Not on file   Social History   Tobacco Use  . Smoking status: Never Smoker  . Smokeless tobacco: Never Used  Substance Use Topics  . Alcohol use: Yes    Comment: occ     Current Meds  Medication Sig  . ANUCORT-HC 25 MG suppository INSERT 1 SUPPOSITORY RECTALLY TWICE DAILY  . aspirin-sod bicarb-citric acid (ALKA-SELTZER) 325 MG TBEF tablet Take 325 mg by mouth every 6 (six) hours as needed.  .  clonazePAM (KLONOPIN) 1 MG tablet Take 1 tablet by mouth daily as needed.  . docusate sodium (COLACE) 100 MG capsule Take 100 mg by mouth 2 (two) times daily.  Marland Kitchen glipiZIDE (GLUCOTROL) 10 MG tablet Take 1 tablet by mouth 2 (two) times daily.  . insulin glargine (LANTUS) 100 UNIT/ML Solostar Pen Inject 20 Units into the skin daily.  . Insulin Pen Needle 32G X 4 MM MISC 1 each by Does not apply route daily.  Marland Kitchen omeprazole (PRILOSEC) 20 MG capsule Take 1 tablet by mouth daily.  . ondansetron (ZOFRAN-ODT) 4 MG disintegrating tablet Take 1 tablet (4 mg total) by mouth every 6 (six) hours as needed for nausea.  Marland Kitchen oxyCODONE (OXY IR/ROXICODONE) 5 MG immediate release tablet Take 1-2 tablets (5-10 mg total) by mouth every 4 (four) hours as needed for severe pain or breakthrough pain.    ROS:  Review of Systems  Eyes: Negative for blurred vision.  Respiratory: Negative for shortness of breath.   Cardiovascular: Negative for chest pain.  Gastrointestinal: Positive for abdominal pain and constipation. Negative for blood in stool, nausea and vomiting.  Neurological: Negative for dizziness and headaches.     Objective:   Today's Vitals: BP (!) 150/86   Pulse 90   Temp (!)  97.2 F (36.2 C)   Resp 16   Ht 5' 6" (1.676 m)   Wt 288 lb (130.6 kg)   SpO2 97%   BMI 46.48 kg/m  Vitals with BMI 10/05/2020 08/31/2020 08/23/2020  Height 5' 6" 5' 9" 5' 6"  Weight 288 lbs 277 lbs 3 oz 275 lbs  BMI 46.51 02.77 41.28  Systolic 786 767 209  Diastolic 86 84 85  Pulse 90 79 80     Physical Exam Vitals reviewed.  Constitutional:      Appearance: Normal appearance.  HENT:     Head: Normocephalic and atraumatic.  Cardiovascular:     Rate and Rhythm: Normal rate and regular rhythm.  Pulmonary:     Effort: Pulmonary effort is normal.     Breath sounds: Normal breath sounds.  Abdominal:     General: Abdomen is protuberant. Bowel sounds are increased. There is no distension.     Palpations: Abdomen is soft.  There is no hepatomegaly, splenomegaly or mass.     Tenderness: There is abdominal tenderness in the right upper quadrant. There is no guarding.  Musculoskeletal:     Cervical back: Neck supple.  Skin:    General: Skin is warm and dry.  Neurological:     Mental Status: He is alert and oriented to person, place, and time.  Psychiatric:        Mood and Affect: Mood normal.        Behavior: Behavior normal.        Thought Content: Thought content normal.        Judgment: Judgment normal.          Assessment and Plan   1. Type 2 diabetes mellitus with hyperglycemia, unspecified whether long term insulin use (HCC)   2. Elevated liver enzymes   3. Encounter for hepatitis C screening test for low risk patient   4. Hemorrhoids, unspecified hemorrhoid type      Plan: 1.  He is encouraged to keep checking his fasting blood sugar on a daily basis as he is not doing this regularly.  We did discuss that Lantus administration can result in burning in his long as the burning is mild he can continue taking his Lantus.  We did discuss he needs to make sure he is rotating sites and tells me he is doing this.  He will also continue on his glipizide.  Foot exam completed today.  He will be due for eye exam again in 1 year we will check for microalbuminuria today.  He will be due for A1c check at next office visit.  Will consider adding statin pending CMP liver enzyme levels.  May also consider adding ACE or ARB at next office visit.  We also further discussed diet and I recommend he avoid all processed carbohydrates, sugary beverages, and red meats.  I encouraged him to eat as many greens as possible as well as to consider eating berries, and lean protein sources.  We also discussed avoiding snacks in between meals.  Also discussed that if he experiences any symptoms of hypoglycemia he did check his blood sugar and if it is low he should have a small meal or snack at bedtime to improve his blood sugar.   He tells me he understands. 2., 3.  We will check CMP, PT-INR, and hepatitis C.  Further recommendations were made based upon these results. 4.  Symptoms associated with his hemorrhoids seems much better controlled at this time.  No further recommendations  were made today.   Tests ordered Orders Placed This Encounter  Procedures  . CMP with eGFR([facility])  . Protime-INR  . Hep C Antibody  . Microalbumin/Creatinine Ratio, Urine      No orders of the defined types were placed in this encounter.   Patient to follow-up in 6 weeks or sooner as needed.  Ailene Ards, NP

## 2020-10-06 LAB — COMPLETE METABOLIC PANEL WITH GFR
AG Ratio: 1.4 (calc) (ref 1.0–2.5)
ALT: 18 U/L (ref 9–46)
AST: 13 U/L (ref 10–40)
Albumin: 4 g/dL (ref 3.6–5.1)
Alkaline phosphatase (APISO): 88 U/L (ref 36–130)
BUN: 15 mg/dL (ref 7–25)
CO2: 29 mmol/L (ref 20–32)
Calcium: 9.5 mg/dL (ref 8.6–10.3)
Chloride: 100 mmol/L (ref 98–110)
Creat: 1.14 mg/dL (ref 0.60–1.35)
GFR, Est African American: 88 mL/min/{1.73_m2} (ref >=60)
GFR, Est Non African American: 76 mL/min/{1.73_m2} (ref >=60)
Globulin: 2.8 g/dL (calc) (ref 1.9–3.7)
Glucose, Bld: 204 mg/dL — ABNORMAL HIGH (ref 65–139)
Potassium: 4.6 mmol/L (ref 3.5–5.3)
Sodium: 139 mmol/L (ref 135–146)
Total Bilirubin: 0.4 mg/dL (ref 0.2–1.2)
Total Protein: 6.8 g/dL (ref 6.1–8.1)

## 2020-10-06 LAB — MICROALBUMIN / CREATININE URINE RATIO
Creatinine, Urine: 118 mg/dL (ref 20–320)
Microalb Creat Ratio: 20 mcg/mg creat (ref <30)
Microalb, Ur: 2.4 mg/dL

## 2020-10-06 LAB — PROTIME-INR
INR: 1
Prothrombin Time: 9.8 s (ref 9.0–11.5)

## 2020-10-06 LAB — HEPATITIS C ANTIBODY
Hepatitis C Ab: NONREACTIVE
SIGNAL TO CUT-OFF: 0.02 (ref <1.00)

## 2020-11-17 ENCOUNTER — Ambulatory Visit (INDEPENDENT_AMBULATORY_CARE_PROVIDER_SITE_OTHER): Payer: No Typology Code available for payment source | Admitting: Nurse Practitioner

## 2020-11-30 ENCOUNTER — Ambulatory Visit (INDEPENDENT_AMBULATORY_CARE_PROVIDER_SITE_OTHER): Payer: No Typology Code available for payment source | Admitting: Nurse Practitioner

## 2020-11-30 ENCOUNTER — Other Ambulatory Visit: Payer: Self-pay

## 2020-11-30 ENCOUNTER — Encounter (INDEPENDENT_AMBULATORY_CARE_PROVIDER_SITE_OTHER): Payer: Self-pay | Admitting: Nurse Practitioner

## 2020-11-30 VITALS — BP 132/76 | HR 81 | Temp 97.8°F | Ht 66.0 in | Wt 293.4 lb

## 2020-11-30 DIAGNOSIS — E785 Hyperlipidemia, unspecified: Secondary | ICD-10-CM

## 2020-11-30 DIAGNOSIS — E1165 Type 2 diabetes mellitus with hyperglycemia: Secondary | ICD-10-CM

## 2020-11-30 NOTE — Progress Notes (Addendum)
Subjective:  Patient ID: Gary Thompson, male    DOB: [birth year]  Age: 50 y.o. MRN: [id number]  CC:  Chief Complaint  Patient presents with  . Follow-up    Doing okay, has been stressed  . Other    Elevated Liver Enzymes  . Diabetes      HPI  This patient arrives today for the above.  Elevated liver enzymes: Blood work showed normalized liver enzymes at last office visit.  Diabetes: Last A1c was 9.8.  He continues on glipizide 10 mg twice a day and Lantus 20 units daily.  He is not on ACE or ARB, at last office visit we checked his urine for albuminuria and this was negative.  He is not on statin therapy currently.  He will sometimes check his blood sugar at home but not on a regular basis.  Tells me as long as he feels well he forgets to check it.  He had a couple of episodes where he has felt unwell, and when he checked his blood sugar it was around 130.  Past Medical History:  Diagnosis Date  . Anxiety   . Diabetes mellitus without complication ([place])   . Vertigo       Family History  Problem Relation Age of Onset  . Hypertension Mother   . Diabetes Mother   . Hypertension Father   . Diabetes Father     Social History   Social History Narrative  . Not on file   Social History   Tobacco Use  . Smoking status: Never Smoker  . Smokeless tobacco: Never Used  Substance Use Topics  . Alcohol use: Yes    Comment: occ     Current Meds  Medication Sig  . aspirin-sod bicarb-citric acid (ALKA-SELTZER) 325 MG TBEF tablet Take 325 mg by mouth every 6 (six) hours as needed.  . clonazePAM (KLONOPIN) 1 MG tablet Take 1 tablet by mouth daily as needed.  . docusate sodium (COLACE) 100 MG capsule Take 100 mg by mouth 2 (two) times daily.  Marland Kitchen glipiZIDE (GLUCOTROL) 10 MG tablet Take 1 tablet by mouth 2 (two) times daily.  . insulin glargine (LANTUS) 100 UNIT/ML Solostar Pen Inject 20 Units into the skin daily.  . Insulin Pen Needle 32G X 4 MM MISC 1 each by Does not  apply route daily.  Marland Kitchen omeprazole (PRILOSEC) 20 MG capsule Take 1 tablet by mouth daily.    ROS:  Review of Systems  Eyes: Negative for blurred vision.  Respiratory: Negative for shortness of breath.   Cardiovascular: Negative for chest pain.  Neurological: Negative for dizziness and headaches.     Objective:   Today's Vitals: BP 132/76   Pulse 81   Temp 97.8 F (36.6 C) (Temporal)   Ht $R'5\' 6"'Rp$  (1.676 m)   Wt 293 lb 6.4 oz (133.1 kg)   SpO2 97%   BMI 47.36 kg/m  Vitals with BMI 11/30/2020 10/05/2020 08/31/2020  Height $Remov'5\' 6"'TzWonT$  $Remove'5\' 6"'zuiZoJr$  $RemoveB'5\' 9"'ztKzDJuQ$   Weight 293 lbs 6 oz 288 lbs 277 lbs 3 oz  BMI 47.38 08.67 61.95  Systolic 093 267 124  Diastolic 76 86 84  Pulse 81 90 79     Physical Exam Vitals reviewed.  Constitutional:      Appearance: Normal appearance.  HENT:     Head: Normocephalic and atraumatic.  Cardiovascular:     Rate and Rhythm: Normal rate and regular rhythm.  Pulmonary:     Effort: Pulmonary effort is normal.  Breath sounds: Normal breath sounds.  Musculoskeletal:     Cervical back: Neck supple.  Skin:    General: Skin is warm and dry.  Neurological:     Mental Status: He is alert and oriented to person, place, and time.  Psychiatric:        Mood and Affect: Mood normal.        Behavior: Behavior normal.        Thought Content: Thought content normal.        Judgment: Judgment normal.          Assessment and Plan   1. Type 2 diabetes mellitus with hyperglycemia, unspecified whether long term insulin use ([place])   2. Hyperlipidemia, unspecified hyperlipidemia type      Plan: 1., 2.  He will continue on his current medications as prescribed.  We will check A1c today.  We did discuss possibly starting ACE or ARB, but patient would prefer to not take any additional medication if at all possible.  I did discuss that if we recheck his urine for microalbuminuria and if this does come back positive we may need to consider that medicine in the future.  He is  agreeable to considering this in the future.  We will also check lipid panel, and may consider initiating statin therapy.   Tests ordered Orders Placed This Encounter  Procedures  . Hemoglobin A1c  . CMP with eGFR([facility])  . Lipid Panel      No orders of the defined types were placed in this encounter.   Patient to follow-up in 3 months or sooner as needed.  Ailene Ards, NP

## 2020-12-01 LAB — LIPID PANEL
Cholesterol: 173 mg/dL (ref <200)
HDL: 35 mg/dL — ABNORMAL LOW (ref >=40)
LDL Cholesterol (Calc): 111 mg/dL (calc) — ABNORMAL HIGH
Non-HDL Cholesterol (Calc): 138 mg/dL (calc) — ABNORMAL HIGH (ref <130)
Total CHOL/HDL Ratio: 4.9 (calc) (ref <5.0)
Triglycerides: 157 mg/dL — ABNORMAL HIGH (ref <150)

## 2020-12-01 LAB — HEMOGLOBIN A1C
Hgb A1c MFr Bld: 8.4 % of total Hgb — ABNORMAL HIGH (ref <5.7)
Mean Plasma Glucose: 194 mg/dL
eAG (mmol/L): 10.8 mmol/L

## 2020-12-01 LAB — COMPLETE METABOLIC PANEL WITH GFR
AG Ratio: 1.6 (calc) (ref 1.0–2.5)
ALT: 23 U/L (ref 9–46)
AST: 16 U/L (ref 10–40)
Albumin: 4.2 g/dL (ref 3.6–5.1)
Alkaline phosphatase (APISO): 93 U/L (ref 36–130)
BUN: 17 mg/dL (ref 7–25)
CO2: 30 mmol/L (ref 20–32)
Calcium: 9.1 mg/dL (ref 8.6–10.3)
Chloride: 103 mmol/L (ref 98–110)
Creat: 1.33 mg/dL (ref 0.60–1.35)
GFR, Est African American: 73 mL/min/{1.73_m2} (ref >=60)
GFR, Est Non African American: 63 mL/min/{1.73_m2} (ref >=60)
Globulin: 2.6 g/dL (calc) (ref 1.9–3.7)
Glucose, Bld: 165 mg/dL — ABNORMAL HIGH (ref 65–139)
Potassium: 4.4 mmol/L (ref 3.5–5.3)
Sodium: 140 mmol/L (ref 135–146)
Total Bilirubin: 0.5 mg/dL (ref 0.2–1.2)
Total Protein: 6.8 g/dL (ref 6.1–8.1)

## 2020-12-22 ENCOUNTER — Other Ambulatory Visit: Payer: Self-pay

## 2020-12-22 ENCOUNTER — Encounter (INDEPENDENT_AMBULATORY_CARE_PROVIDER_SITE_OTHER): Payer: Self-pay | Admitting: Nurse Practitioner

## 2020-12-22 ENCOUNTER — Telehealth (INDEPENDENT_AMBULATORY_CARE_PROVIDER_SITE_OTHER): Payer: No Typology Code available for payment source | Admitting: Nurse Practitioner

## 2020-12-22 DIAGNOSIS — K219 Gastro-esophageal reflux disease without esophagitis: Secondary | ICD-10-CM | POA: Diagnosis not present

## 2020-12-22 DIAGNOSIS — E1165 Type 2 diabetes mellitus with hyperglycemia: Secondary | ICD-10-CM | POA: Diagnosis not present

## 2020-12-22 MED ORDER — OMEPRAZOLE 40 MG PO CPDR: 40.0000 mg | DELAYED_RELEASE_CAPSULE | Freq: Every day | ORAL | 0 refills | Status: DC

## 2020-12-22 MED ORDER — GLIPIZIDE 10 MG PO TABS: 20.0000 mg | ORAL_TABLET | Freq: Two times a day (BID) | ORAL | 3 refills | Status: DC

## 2020-12-22 NOTE — Progress Notes (Signed)
Due to national recommendations of social distancing related to the [address] pandemic, an audio-only tele-health visit was felt to be the most appropriate encounter type for this patient today. I connected with  [name] on 12/22/20 utilizing audio-only technology and verified that I am speaking with the correct person using two identifiers. The patient was located at their place of employment, and I was located at the office of [facility] during the encounter. I discussed the limitations of evaluation and management by telemedicine. The patient expressed understanding and agreed to proceed.   Subjective:  Patient ID: Doniven Vanpatten, male    DOB: 1970/12/28  Age: 50 y.o. MRN: [id number]  CC:  Chief Complaint  Patient presents with  . Diabetes  . Gastroesophageal Reflux      HPI  This patient arrives today for a virtual visit for the above.  Diabetes: He continues on glipizide 10 mg twice a day, Lantus 20 units daily.  Last A1c was collected earlier this month and it was 8.4.  He is not on ACE or ARB or statin therapy.  GERD: He is on omeprazole 20 mg daily but tells me he has been having worse heartburn recently in fact it is waking him up at night.  He feels that he is starting to down on acid reflux and would like to see if he can increase his dose of the omeprazole.  Past Medical History:  Diagnosis Date  . Anxiety   . Diabetes mellitus without complication ([place])   . Vertigo       Family History  Problem Relation Age of Onset  . Hypertension Mother   . Diabetes Mother   . Hypertension Father   . Diabetes Father     Social History   Social History Narrative  . Not on file   Social History   Tobacco Use  . Smoking status: Never Smoker  . Smokeless tobacco: Never Used  Substance Use Topics  . Alcohol use: Yes    Comment: occ     Current Meds  Medication Sig  . aspirin-sod bicarb-citric acid (ALKA-SELTZER) 325 MG TBEF tablet Take 325 mg  by mouth every 6 (six) hours as needed.  . clonazePAM (KLONOPIN) 1 MG tablet Take 1 tablet by mouth daily as needed.  . docusate sodium (COLACE) 100 MG capsule Take 100 mg by mouth 2 (two) times daily.  . insulin glargine (LANTUS) 100 UNIT/ML Solostar Pen Inject 20 Units into the skin daily.  Marland Kitchen omeprazole (PRILOSEC) 40 MG capsule Take 1 capsule (40 mg total) by mouth daily.  . [DISCONTINUED] glipiZIDE (GLUCOTROL) 10 MG tablet Take 1 tablet by mouth 2 (two) times daily.  . [DISCONTINUED] omeprazole (PRILOSEC) 20 MG capsule Take 1 tablet by mouth daily.    ROS:  Review of Systems  Respiratory: Negative for cough and shortness of breath.   Cardiovascular: Negative for chest pain.  Gastrointestinal: Positive for heartburn.     Objective:   Today's Vitals: There were no vitals taken for this visit. Vitals with BMI 11/30/2020 10/05/2020 08/31/2020  Height 5\' 6"  5\' 6"  5\' 9"   Weight 293 lbs 6 oz 288 lbs 277 lbs 3 oz  BMI 47.38 08.14 48.18  Systolic 563 149 702  Diastolic 76 86 84  Pulse 81 90 79     Physical Exam Comprehensive physical exam not completed today as office visit was conducted remotely.  Patient sounded well over the phone.  Patient was alert and oriented, and appeared to have  appropriate judgment.       Assessment and Plan   1. Gastroesophageal reflux disease without esophagitis   2. Type 2 diabetes mellitus with hyperglycemia, unspecified whether long term insulin use ([place])      Plan: 1.  We will increase his omeprazole to 40 mg daily.  May consider changing to a different agent as necessary. 2.  We discussed medication options, and he would prefer not to add an additional agent if possible right now.  He does not want an additional injectable either if possible.  Per shared decision making we have decided to increase his glipizide from 10 mg twice a day to 20 mg twice a day.  He does check his blood sugar at home and does have hypoglycemic awareness.  Thus, he was told  to treat any hypoglycemic events by drinking 4 ounces of soda/juice, or eating a snack.  He was told that if he has 3 or more events between now and his next appointment with me that he should notify us for assistance with managing his medications.  He expresses understanding.   Tests ordered No orders of the defined types were placed in this encounter.     Meds ordered this encounter  Medications  . omeprazole (PRILOSEC) 40 MG capsule    Sig: Take 1 capsule (40 mg total) by mouth daily.    Dispense:  90 capsule    Refill:  0    Order Specific Question:   Supervising Provider    Answer:   Hurshel Party C [[id number]]  . glipiZIDE (GLUCOTROL) 10 MG tablet    Sig: Take 2 tablets (20 mg total) by mouth 2 (two) times daily.    Dispense:  120 tablet    Refill:  3    Order Specific Question:   Supervising Provider    Answer:   Doree Albee [[id number]]    Patient to follow-up in 4 telephone visit in 1 month to assess tolerance to the medication changes as discussed above.  Today's telephone conversation lasted for 13 minutes and 20 seconds.  Ailene Ards, NP

## 2020-12-22 NOTE — Telephone Encounter (Signed)
This encounter was created in error - please disregard.

## 2020-12-26 ENCOUNTER — Telehealth (INDEPENDENT_AMBULATORY_CARE_PROVIDER_SITE_OTHER): Payer: Self-pay

## 2020-12-26 NOTE — Telephone Encounter (Signed)
Patient called and stated that he thought he was suppose to increase his Lantus to 25 units daily but he could not find the message anywhere. I looked in his lab results and in the chart notes and I do not see this message. Please advise if you want patient to stay on 20 units of Lantus or if you wanted him to increase to 25 units of Lantus daily.

## 2020-12-26 NOTE — Telephone Encounter (Signed)
When I did his video visit the other day we decided to not increase his insulin, but instead to increase his glipizide to 20mg  by mouth twice a day (he was on 10mg  by mouth twice a day). Let me know if he has any other questions.

## 2020-12-26 NOTE — Telephone Encounter (Signed)
Called patient and gave him the message from Judson Roch. Patient verbalized an understanding and will let us know if he has any changes.

## 2021-01-16 ENCOUNTER — Encounter (INDEPENDENT_AMBULATORY_CARE_PROVIDER_SITE_OTHER): Payer: Self-pay | Admitting: Nurse Practitioner

## 2021-01-16 ENCOUNTER — Ambulatory Visit (INDEPENDENT_AMBULATORY_CARE_PROVIDER_SITE_OTHER): Payer: No Typology Code available for payment source | Admitting: Nurse Practitioner

## 2021-01-16 ENCOUNTER — Other Ambulatory Visit: Payer: Self-pay

## 2021-01-16 DIAGNOSIS — F419 Anxiety disorder, unspecified: Secondary | ICD-10-CM

## 2021-01-16 DIAGNOSIS — K219 Gastro-esophageal reflux disease without esophagitis: Secondary | ICD-10-CM | POA: Diagnosis not present

## 2021-01-16 DIAGNOSIS — E1165 Type 2 diabetes mellitus with hyperglycemia: Secondary | ICD-10-CM

## 2021-01-16 MED ORDER — INSULIN GLARGINE 100 UNIT/ML SOLOSTAR PEN
22.0000 [IU] | PEN_INJECTOR | Freq: Every day | SUBCUTANEOUS | 11 refills | Status: DC
Start: 2021-01-16 — End: 2021-08-14

## 2021-01-16 NOTE — Progress Notes (Signed)
Due to national recommendations of social distancing related to the [place] pandemic, an audio-only tele-health visit was felt to be the most appropriate encounter type for this patient today. I connected with  Gary Thompson on 01/16/21 utilizing audio-only technology and verified that I am speaking with the correct person using two identifiers. The patient was located at their place of employment, and I was located at the office of [facility] during the encounter. I discussed the limitations of evaluation and management by telemedicine. The patient expressed understanding and agreed to proceed.     Subjective:  Patient ID: Gary Thompson, male    DOB: 1970-11-19  Age: 50 y.o. MRN: [id number]  CC:  Chief Complaint  Patient presents with  . Anxiety  . Diabetes  . Gastroesophageal Reflux      HPI  This patient arrives today for the above.  Anxiety: She continues to take Klonopin on as-needed basis.  He is prescribed this by a doctor in [place].  Per controlled substance database I do see that last time it was filled was about 3 months ago and it was a 30-day supply and he still has some tablets left over.  He tells me that when he has an anxiety attack he will notice some difficulty breathing and some chest pain but within an hour taking his Klonopin and symptoms subside.  He has had 1 anxiety episode since my last appointment with him.  Diabetes: At his last visit we increase his glipizide to 20 mg twice a day from 10 mg twice a day.  He also continues on Lantus 20 units daily.  He tells me that he does not check his blood sugar as often as he should, but when he remembers to check it its routinely above 200.  This is sometimes a fasting level and sometimes it is postprandial.  He would not like to add any additional medications if possible and just titrate up on the medications he takes as able for treatment of his diabetes.  Last A1c was collected about 1 month ago and it  was 8.4.  GERD: We increase his omeprazole last visit to 40 mg daily.  He tells me that his symptoms are improved with the increase but he has had some breakthrough heartburn.  He tells me is happened about 2 times over the last month.  He tells me that this will occur when he eats foods that he knows are triggers for his heartburn.  When it occurs he will take Alka-Seltzer as needed and the symptoms subside.  Past Medical History:  Diagnosis Date  . Anxiety   . Diabetes mellitus without complication ([place])   . Vertigo       Family History  Problem Relation Age of Onset  . Hypertension Mother   . Diabetes Mother   . Hypertension Father   . Diabetes Father     Social History   Social History Narrative  . Not on file   Social History   Tobacco Use  . Smoking status: Never Smoker  . Smokeless tobacco: Never Used  Substance Use Topics  . Alcohol use: Yes    Comment: occ     Current Meds  Medication Sig  . aspirin-sod bicarb-citric acid (ALKA-SELTZER) 325 MG TBEF tablet Take 325 mg by mouth every 6 (six) hours as needed.  . clonazePAM (KLONOPIN) 1 MG tablet Take 1 tablet by mouth daily as needed.  . docusate sodium (COLACE) 100 MG capsule Take 100 mg  by mouth 2 (two) times daily.  Marland Kitchen glipiZIDE (GLUCOTROL) 10 MG tablet Take 2 tablets (20 mg total) by mouth 2 (two) times daily.  . Insulin Pen Needle 32G X 4 MM MISC 1 each by Does not apply route daily.  Marland Kitchen omeprazole (PRILOSEC) 40 MG capsule Take 1 capsule (40 mg total) by mouth daily.  . [DISCONTINUED] insulin glargine (LANTUS) 100 UNIT/ML Solostar Pen Inject 20 Units into the skin daily.    ROS:  Review of Systems  Respiratory: Negative for shortness of breath (will experience SOB with anxiety attacks).   Cardiovascular: Negative for chest pain.  Gastrointestinal: Positive for diarrhea and heartburn. Negative for nausea and vomiting.       (-) dysphagia/oodynophagia     Objective:   Today's Vitals: There were no  vitals taken for this visit. Vitals with BMI 11/30/2020 10/05/2020 08/31/2020  Height 5\' 6"  5\' 6"  5\' 9"   Weight 293 lbs 6 oz 288 lbs 277 lbs 3 oz  BMI 47.38 26.33 35.45  Systolic 625 638 937  Diastolic 76 86 84  Pulse 81 90 79     Physical Exam Comprehensive physical exam not completed today as office visit was conducted remotely.  Patient sounded well over the phone.  Patient was alert and oriented, and appeared to have appropriate judgment.       Assessment and Plan   1. Anxiety   2. Type 2 diabetes mellitus with hyperglycemia, unspecified whether long term insulin use ([place])   3. Gastroesophageal reflux disease without esophagitis      Plan: 1.  He will continue taking his Klonopin as needed and will follow up with his provider in [place] who prescribes this as scheduled. 2.  We will titrate his Lantus up to 22 units, I told him to check a fasting blood sugar for at least 3 days after the dose increase.  If fasting blood sugars remain above 200 he should titrate further up to 24 units.  We also discussed if he has any low blood sugar events that he should return back to 22 units and he will follow-up as scheduled in about 6 weeks for further monitoring recommendations. 3.  For now he will continue on omeprazole 40 mg daily.  May consider referral to GI in the future if symptoms persist or worsen.   Tests ordered No orders of the defined types were placed in this encounter.     Meds ordered this encounter  Medications  . insulin glargine (LANTUS) 100 UNIT/ML Solostar Pen    Sig: Inject 22 Units into the skin daily.    Dispense:  15 mL    Refill:  11    Order Specific Question:   Supervising Provider    Answer:   Doree Albee [[id number]]    Patient to follow-up in 6 weeks or sooner as needed.  Total time spent on telephone today was 12 minutes and 43 seconds.  Ailene Ards, NP

## 2021-01-18 ENCOUNTER — Ambulatory Visit (INDEPENDENT_AMBULATORY_CARE_PROVIDER_SITE_OTHER): Payer: No Typology Code available for payment source | Admitting: Nurse Practitioner

## 2021-02-21 ENCOUNTER — Ambulatory Visit
Admission: EM | Admit: 2021-02-21 | Discharge: 2021-02-21 | Disposition: A | Discharge status: Home / Self Care | Payer: No Typology Code available for payment source | Attending: Physician Assistant | Admitting: Physician Assistant

## 2021-02-21 ENCOUNTER — Encounter: Payer: Self-pay | Admitting: Emergency Medicine

## 2021-02-21 ENCOUNTER — Other Ambulatory Visit: Payer: Self-pay

## 2021-02-21 DIAGNOSIS — J069 Acute upper respiratory infection, unspecified: Secondary | ICD-10-CM

## 2021-02-21 DIAGNOSIS — R059 Cough, unspecified: Secondary | ICD-10-CM

## 2021-02-21 NOTE — Discharge Instructions (Addendum)
Your covid test is pending 

## 2021-02-21 NOTE — ED Triage Notes (Signed)
Cough, shortness of breath, runny nose x 2 weeks.  Pos covid at home covid test today.

## 2021-02-22 LAB — SARS-COV-2, NAA 2 DAY TAT

## 2021-02-22 LAB — NOVEL CORONAVIRUS, NAA: SARS-CoV-2, NAA: NOT DETECTED

## 2021-02-23 NOTE — ED Provider Notes (Signed)
[facility]    CSN: [id number] Arrival date & time: 02/21/21  1612      History   Chief Complaint No chief complaint on file.   HPI Gary Thompson is a 50 y.o. male.   The history is provided by the patient. No language interpreter was used.  Cough Cough characteristics:  Non-productive Sputum characteristics:  Nondescript Severity:  Moderate Onset quality:  Gradual Duration:  3 days Timing:  Constant Progression:  Worsening Relieved by:  Nothing Worsened by:  Nothing Ineffective treatments:  None tried Associated symptoms: no shortness of breath   Pt reports positive home covid test  Past Medical History:  Diagnosis Date   Anxiety    Diabetes mellitus without complication ([place])    Vertigo     Patient Active Problem List   Diagnosis Date Noted   Anxiety 08/31/2020   Type 2 diabetes mellitus with hyperglycemia ([place]) 08/31/2020   Hemorrhoids 08/31/2020   Acute cholecystitis 08/12/2020   Gangrenous cholecystitis 08/10/2020    Past Surgical History:  Procedure Laterality Date   CHOLECYSTECTOMY N/A 08/11/2020   Procedure: LAPAROSCOPIC CHOLECYSTECTOMY;  Surgeon: Virl Cagey, MD;  Location: [facility];  Service: General;  Laterality: N/A;   GALLBLADDER SURGERY     KNEE SURGERY     UMBILICAL HERNIA REPAIR N/A 08/11/2020   Procedure: HERNIA REPAIR UMBILICAL ADULT;  Surgeon: Virl Cagey, MD;  Location: [facility];  Service: General;  Laterality: N/A;       Home Medications    Prior to Admission medications   Medication Sig Start Date End Date Taking? Authorizing Provider  aspirin-sod bicarb-citric acid (ALKA-SELTZER) 325 MG TBEF tablet Take 325 mg by mouth every 6 (six) hours as needed.    [provider]  clonazePAM (KLONOPIN) 1 MG tablet Take 1 tablet by mouth daily as needed. 07/29/20   [provider]  docusate sodium (COLACE) 100 MG capsule Take 100 mg by mouth 2 (two) times daily.    [provider]  glipiZIDE  (GLUCOTROL) 10 MG tablet Take 2 tablets (20 mg total) by mouth 2 (two) times daily. 12/22/20   Ailene Ards, NP  insulin glargine (LANTUS) 100 UNIT/ML Solostar Pen Inject 22 Units into the skin daily. 01/16/21   Ailene Ards, NP  Insulin Pen Needle 32G X 4 MM MISC 1 each by Does not apply route daily. 08/31/20   Ailene Ards, NP  omeprazole (PRILOSEC) 40 MG capsule Take 1 capsule (40 mg total) by mouth daily. 12/22/20   Ailene Ards, NP    Family History Family History  Problem Relation Age of Onset   Hypertension Mother    Diabetes Mother    Hypertension Father    Diabetes Father     Social History Social History   Tobacco Use   Smoking status: Never   Smokeless tobacco: Never  Vaping Use   Vaping Use: Never used  Substance Use Topics   Alcohol use: Yes    Comment: occ   Drug use: Not Currently     Allergies   Dm-doxylamine-acetaminophen and Vicks dayquil-nyquil cld & flu [pe-dm-apap & doxylamin-dm-apap]   Review of Systems Review of Systems  Respiratory:  Positive for cough. Negative for shortness of breath.   All other systems reviewed and are negative.   Physical Exam Triage Vital Signs ED Triage Vitals  Enc Vitals Group     BP 02/21/21 1658 (!) 162/99     Pulse Rate 02/21/21 1658 77  Resp 02/21/21 1658 18     Temp 02/21/21 1658 98 F (36.7 C)     Temp Source 02/21/21 1658 Oral     SpO2 02/21/21 1658 96 %     Weight --      Height --      Head Circumference --      Peak Flow --      Pain Score 02/21/21 1657 0     Pain Loc --      Pain Edu? --      Excl. in [place]? --    No data found.  Updated Vital Signs BP (!) 162/99 (BP Location: Right Arm)   Pulse 77   Temp 98 F (36.7 C) (Oral)   Resp 18   SpO2 96%   Visual Acuity Right Eye Distance:   Left Eye Distance:   Bilateral Distance:    Right Eye Near:   Left Eye Near:    Bilateral Near:     Physical Exam Vitals and nursing note reviewed.  Constitutional:      Appearance: He is  well-developed.  HENT:     Head: Normocephalic and atraumatic.  Eyes:     Conjunctiva/sclera: Conjunctivae normal.  Cardiovascular:     Rate and Rhythm: Normal rate and regular rhythm.     Heart sounds: No murmur heard. Pulmonary:     Effort: Pulmonary effort is normal. No respiratory distress.     Breath sounds: Normal breath sounds.  Musculoskeletal:        General: Normal range of motion.     Cervical back: Neck supple.  Skin:    General: Skin is warm and dry.  Neurological:     General: No focal deficit present.     Mental Status: He is alert.     UC Treatments / Results  Labs (all labs ordered are listed, but only abnormal results are displayed) Labs Reviewed  NOVEL CORONAVIRUS, NAA   Narrative:    Performed at:  [address] [place], [place]  [id number] Lab Director: Rush Farmer MD, Phone:  [phone]  SARS-COV-2, NAA 2 DAY TAT   Narrative:    Performed at:  [facility] [address] [place], [place]  [id number] Lab Director: Rush Farmer MD, Phone:  [phone]    EKG   Radiology No results found.  Procedures Procedures (including critical care time)  Medications Ordered in UC Medications - No data to display  Initial Impression / Assessment and Plan / UC Course  I have reviewed the triage vital signs and the nursing notes.  Pertinent labs & imaging results that were available during my care of the patient were reviewed by me and considered in my medical decision making (see chart for details).     MDM:  Pt advised symptomatic care.  Return if any problems.  Final Clinical Impressions(s) / UC Diagnoses   Final diagnoses:  Cough  Viral URI     Discharge Instructions      Your covid test is pending   ED Prescriptions   None    PDMP not reviewed this encounter. An After Visit Summary was printed and given to the patient.    Fransico Meadow, [place] 02/23/21 2236

## 2021-03-02 ENCOUNTER — Ambulatory Visit (INDEPENDENT_AMBULATORY_CARE_PROVIDER_SITE_OTHER): Payer: No Typology Code available for payment source | Admitting: Nurse Practitioner

## 2021-03-23 ENCOUNTER — Other Ambulatory Visit (INDEPENDENT_AMBULATORY_CARE_PROVIDER_SITE_OTHER): Payer: Self-pay | Admitting: Nurse Practitioner

## 2021-03-23 DIAGNOSIS — K219 Gastro-esophageal reflux disease without esophagitis: Secondary | ICD-10-CM

## 2021-04-05 ENCOUNTER — Other Ambulatory Visit (INDEPENDENT_AMBULATORY_CARE_PROVIDER_SITE_OTHER): Payer: Self-pay | Admitting: Nurse Practitioner

## 2021-04-05 ENCOUNTER — Ambulatory Visit (INDEPENDENT_AMBULATORY_CARE_PROVIDER_SITE_OTHER): Payer: No Typology Code available for payment source | Admitting: Nurse Practitioner

## 2021-04-05 DIAGNOSIS — K219 Gastro-esophageal reflux disease without esophagitis: Secondary | ICD-10-CM

## 2021-05-15 ENCOUNTER — Encounter: Payer: Self-pay | Admitting: Nurse Practitioner

## 2021-05-15 ENCOUNTER — Other Ambulatory Visit: Payer: Self-pay

## 2021-05-15 ENCOUNTER — Ambulatory Visit (INDEPENDENT_AMBULATORY_CARE_PROVIDER_SITE_OTHER): Payer: Self-pay | Admitting: Nurse Practitioner

## 2021-05-15 VITALS — BP 146/82 | HR 72 | Temp 98.2°F | Ht 69.0 in | Wt 300.0 lb

## 2021-05-15 DIAGNOSIS — F410 Panic disorder [episodic paroxysmal anxiety] without agoraphobia: Secondary | ICD-10-CM

## 2021-05-15 DIAGNOSIS — G4733 Obstructive sleep apnea (adult) (pediatric): Secondary | ICD-10-CM | POA: Insufficient documentation

## 2021-05-15 DIAGNOSIS — Z0001 Encounter for general adult medical examination with abnormal findings: Secondary | ICD-10-CM | POA: Insufficient documentation

## 2021-05-15 DIAGNOSIS — E1165 Type 2 diabetes mellitus with hyperglycemia: Secondary | ICD-10-CM

## 2021-05-15 DIAGNOSIS — Z7689 Persons encountering health services in other specified circumstances: Secondary | ICD-10-CM

## 2021-05-15 NOTE — Assessment & Plan Note (Signed)
-  check A1c with next labs -LDL goal < 70 -takes lantus and glipizide -no documentation of metformin intolerance -would consider GLP-1 or mounjaro when he gets insurance (30-60 days form now).

## 2021-05-15 NOTE — Assessment & Plan Note (Signed)
-  states he used CPAP in the past, but he lost 50 pounds -he gained the weight back, and states he may need a new CPAP machine -he would like to consider testing once he gets insurance

## 2021-05-15 NOTE — Assessment & Plan Note (Signed)
-  obtain records 

## 2021-05-15 NOTE — Patient Instructions (Signed)
Please have fasting labs drawn 2-3 days prior to your appointment so we can discuss the results during your office visit.  

## 2021-05-15 NOTE — Assessment & Plan Note (Signed)
-  takes klonopin, prescribed by his previous PCP -we discussed maintenance therapy for anxiety, but he wants to keep his klonopin because he knows it works -has panic attacks, and feels like he can't breath

## 2021-05-15 NOTE — Progress Notes (Signed)
New Patient Office Visit  Subjective:  Patient ID: Gary Thompson, male    DOB: 1971/04/22  Age: 50 y.o. MRN: [id number]  CC:  Chief Complaint  Patient presents with   New Patient (Initial Visit)    Here to establish care, no complaints today. Does have Diabetes but not have insurance at the moment so cannot afford the meds.     HPI Gary Thompson presents for new patient visit, Transferring care from [facility] He has klonopin managed by [facility]. Last physical was over a year ago. Last labs were drawn in April 2022.  He has anxiety related to bills. He takes klonopin, and states he takes this every other day. He sees [employer] in [place], [place] near [place]. He states that he gets panic attacks, and he can't catch his breath when he has them. He feels better knowing that he has klonopin.  Past Medical History:  Diagnosis Date   Anxiety    Diabetes mellitus without complication ([place])    Sleep apnea    Vertigo     Past Surgical History:  Procedure Laterality Date   CHOLECYSTECTOMY N/A 08/11/2020   Procedure: LAPAROSCOPIC CHOLECYSTECTOMY;  Surgeon: Virl Cagey, MD;  Location: [facility];  Service: General;  Laterality: N/A;   GALLBLADDER SURGERY     KNEE SURGERY     UMBILICAL HERNIA REPAIR N/A 08/11/2020   Procedure: HERNIA REPAIR UMBILICAL ADULT;  Surgeon: Virl Cagey, MD;  Location: [facility];  Service: General;  Laterality: N/A;    Family History  Problem Relation Age of Onset   Cancer Mother    Hypertension Mother    Diabetes Mother    Cancer Father    Hypertension Father    Diabetes Father    Cancer Paternal Grandmother    COPD Paternal Grandfather     Social History   Socioeconomic History   Marital status: Divorced    Spouse name: Not on file   Number of children: 0   Years of education: Not on file   Highest education level: Not on file  Occupational History   Not on file  Tobacco Use   Smoking status: Never   Smokeless tobacco: Never   Vaping Use   Vaping Use: Never used  Substance and Sexual Activity   Alcohol use: Yes    Comment: occ; usually less than 1 per week   Drug use: Not Currently   Sexual activity: Yes  Other Topics Concern   Not on file  Social History Narrative   Not on file   Social Determinants of Health   Financial Resource Strain: Not on file  Food Insecurity: Not on file  Transportation Needs: Not on file  Physical Activity: Not on file  Stress: Not on file  Social Connections: Not on file  Intimate Partner Violence: Not on file    ROS Review of Systems  Constitutional: Negative.   Respiratory: Negative.    Cardiovascular: Negative.   Endocrine: Negative.   Musculoskeletal: Negative.   Psychiatric/Behavioral: Negative.     Objective:   Today's Vitals: BP (!) 146/82 (BP Location: Left Arm, Patient Position: Sitting, Cuff Size: Large)   Pulse 72   Temp 98.2 F (36.8 C) (Oral)   Ht $R'5\' 9"'An$  (1.753 m)   Wt 300 lb (136.1 kg)   SpO2 96%   BMI 44.30 kg/m   Physical Exam Constitutional:      Appearance: Normal appearance. He is obese.  Cardiovascular:     Rate and Rhythm: Normal  rate and regular rhythm.     Pulses: Normal pulses.     Heart sounds: Normal heart sounds.  Pulmonary:     Effort: Pulmonary effort is normal.     Breath sounds: Normal breath sounds.  Neurological:     Mental Status: He is alert.  Psychiatric:        Mood and Affect: Mood normal.        Behavior: Behavior normal.        Thought Content: Thought content normal.        Judgment: Judgment normal.    Assessment & Plan:   Problem List Items Addressed This Visit       Respiratory   OSA (obstructive sleep apnea)    -states he used CPAP in the past, but he lost 50 pounds -he gained the weight back, and states he may need a new CPAP machine -he would like to consider testing once he gets insurance        Endocrine   Type 2 diabetes mellitus with hyperglycemia (HCC)    -check A1c with next  labs -LDL goal < 70 -takes lantus and glipizide -no documentation of metformin intolerance -would consider GLP-1 or mounjaro when he gets insurance (30-60 days form now).      Relevant Orders   CBC with Differential/Platelet   Lipid Panel With LDL/HDL Ratio   CMP14+EGFR   Hemoglobin A1c     Other   Panic disorder    -takes klonopin, prescribed by his previous PCP -we discussed maintenance therapy for anxiety, but he wants to keep his klonopin because he knows it works -has panic attacks, and feels like he can't breath      Establishing care with new doctor, encounter for - Primary    -obtain records      Relevant Orders   CBC with Differential/Platelet   Lipid Panel With LDL/HDL Ratio   CMP14+EGFR   Hemoglobin A1c    Outpatient Encounter Medications as of 05/15/2021  Medication Sig   aspirin-sod bicarb-citric acid (ALKA-SELTZER) 325 MG TBEF tablet Take 325 mg by mouth every 6 (six) hours as needed.   clonazePAM (KLONOPIN) 1 MG tablet Take 1 tablet by mouth daily as needed.   glipiZIDE (GLUCOTROL) 10 MG tablet Take 2 tablets (20 mg total) by mouth 2 (two) times daily. (Patient taking differently: Take 10 mg by mouth 2 (two) times daily.)   insulin glargine (LANTUS) 100 UNIT/ML Solostar Pen Inject 22 Units into the skin daily.   Insulin Pen Needle 32G X 4 MM MISC 1 each by Does not apply route daily.   omeprazole (PRILOSEC) 40 MG capsule Take 1 capsule by mouth once daily   [DISCONTINUED] docusate sodium (COLACE) 100 MG capsule Take 100 mg by mouth 2 (two) times daily. (Patient not taking: Reported on 05/15/2021)   No facility-administered encounter medications on file as of 05/15/2021.    Follow-up: Return in about 2 months (around 07/15/2021) for Physical Exam.   Noreene Larsson, NP

## 2021-07-01 ENCOUNTER — Other Ambulatory Visit (INDEPENDENT_AMBULATORY_CARE_PROVIDER_SITE_OTHER): Payer: Self-pay | Admitting: Nurse Practitioner

## 2021-07-01 DIAGNOSIS — K219 Gastro-esophageal reflux disease without esophagitis: Secondary | ICD-10-CM

## 2021-07-13 ENCOUNTER — Telehealth: Payer: Self-pay | Admitting: Nurse Practitioner

## 2021-07-13 ENCOUNTER — Other Ambulatory Visit: Payer: Self-pay | Admitting: *Deleted

## 2021-07-13 ENCOUNTER — Other Ambulatory Visit: Payer: Self-pay

## 2021-07-13 ENCOUNTER — Telehealth: Payer: Self-pay

## 2021-07-13 DIAGNOSIS — K219 Gastro-esophageal reflux disease without esophagitis: Secondary | ICD-10-CM

## 2021-07-13 MED ORDER — OMEPRAZOLE 40 MG PO CPDR: 40.0000 mg | DELAYED_RELEASE_CAPSULE | Freq: Every day | ORAL | 0 refills | Status: DC

## 2021-07-13 MED ORDER — OMEPRAZOLE 40 MG PO CPDR
40.0000 mg | DELAYED_RELEASE_CAPSULE | Freq: Every day | ORAL | 0 refills | Status: DC
Start: 2021-07-13 — End: 2021-07-13

## 2021-07-13 NOTE — Telephone Encounter (Signed)
Refill has been sent into patients pharmacy

## 2021-07-13 NOTE — Telephone Encounter (Signed)
Refills sent

## 2021-07-13 NOTE — Telephone Encounter (Signed)
Patient called need med refill Omeprazole 40 mg  Pharmacy: [facility]

## 2021-07-13 NOTE — Telephone Encounter (Signed)
Pt called n for refill on   omeprazole (PRILOSEC) 40 MG capsule

## 2021-07-27 ENCOUNTER — Encounter: Payer: Self-pay | Admitting: Nurse Practitioner

## 2021-08-14 ENCOUNTER — Other Ambulatory Visit: Payer: Self-pay

## 2021-08-14 ENCOUNTER — Encounter: Payer: Self-pay | Admitting: Nurse Practitioner

## 2021-08-14 ENCOUNTER — Ambulatory Visit (INDEPENDENT_AMBULATORY_CARE_PROVIDER_SITE_OTHER): Payer: 59 | Admitting: Nurse Practitioner

## 2021-08-14 VITALS — BP 149/85 | HR 86 | Ht 66.0 in | Wt 292.1 lb

## 2021-08-14 DIAGNOSIS — Z23 Encounter for immunization: Secondary | ICD-10-CM | POA: Diagnosis not present

## 2021-08-14 DIAGNOSIS — E1159 Type 2 diabetes mellitus with other circulatory complications: Secondary | ICD-10-CM | POA: Insufficient documentation

## 2021-08-14 DIAGNOSIS — R030 Elevated blood-pressure reading, without diagnosis of hypertension: Secondary | ICD-10-CM

## 2021-08-14 DIAGNOSIS — Z0001 Encounter for general adult medical examination with abnormal findings: Secondary | ICD-10-CM | POA: Diagnosis not present

## 2021-08-14 DIAGNOSIS — E1165 Type 2 diabetes mellitus with hyperglycemia: Secondary | ICD-10-CM

## 2021-08-14 MED ORDER — TRESIBA FLEXTOUCH 200 UNIT/ML ~~LOC~~ SOPN: 26.0000 [IU] | PEN_INJECTOR | Freq: Every day | SUBCUTANEOUS | 2 refills | Status: DC

## 2021-08-14 MED ORDER — OLMESARTAN MEDOXOMIL 20 MG PO TABS: 20.0000 mg | ORAL_TABLET | Freq: Every day | ORAL | 3 refills | Status: DC

## 2021-08-14 NOTE — Assessment & Plan Note (Signed)
BP Readings from Last 3 Encounters:  08/14/21 (!) 149/85  05/15/21 (!) 146/82  02/21/21 (!) 162/99   -Rx. olmesartan

## 2021-08-14 NOTE — Addendum Note (Signed)
Addended by: Quentin Angst on: 08/14/2021 09:14 AM   Modules accepted: Orders

## 2021-08-14 NOTE — Assessment & Plan Note (Addendum)
-  he states he had some irritation with lantus injections at the site of injection -check A1c today -STOP lantus d/t injection site pain -Rx. tresiba -checking lipids; may consider statin if LDL > 70 -olmesartan started today -may consider GLP-1 or mounjaro based on A1c and const-effectiveness

## 2021-08-14 NOTE — Progress Notes (Signed)
Acute Office Visit  Subjective:    Patient ID: Gary Thompson, male    DOB: 1970-09-17, 50 y.o.   MRN: [id number]  Chief Complaint  Patient presents with   Annual Exam    Cpe, wants to talk about insulin    HPI Patient is in today for physical exam.  He states his blood sugar ran from 200-400. He ran out of Lantus about a week ago. He states he is having injection site pain with lantus and he would like to try something different.  Past Medical History:  Diagnosis Date   Anxiety    Diabetes mellitus without complication ([place])    Sleep apnea    Vertigo     Past Surgical History:  Procedure Laterality Date   CHOLECYSTECTOMY N/A 08/11/2020   Procedure: LAPAROSCOPIC CHOLECYSTECTOMY;  Surgeon: Virl Cagey, MD;  Location: [facility];  Service: General;  Laterality: N/A;   GALLBLADDER SURGERY     KNEE SURGERY     UMBILICAL HERNIA REPAIR N/A 08/11/2020   Procedure: HERNIA REPAIR UMBILICAL ADULT;  Surgeon: Virl Cagey, MD;  Location: [facility];  Service: General;  Laterality: N/A;    Family History  Problem Relation Age of Onset   Cancer Mother    Hypertension Mother    Diabetes Mother    Cancer Father    Hypertension Father    Diabetes Father    Cancer Paternal Grandmother    COPD Paternal Grandfather     Social History   Socioeconomic History   Marital status: Divorced    Spouse name: Not on file   Number of children: 0   Years of education: Not on file   Highest education level: Not on file  Occupational History   Not on file  Tobacco Use   Smoking status: Never   Smokeless tobacco: Never  Vaping Use   Vaping Use: Never used  Substance and Sexual Activity   Alcohol use: Yes    Comment: occ; usually less than 1 per week   Drug use: Not Currently   Sexual activity: Yes  Other Topics Concern   Not on file  Social History Narrative   Not on file   Social Determinants of Health   Financial Resource Strain: Not on file  Food Insecurity: Not on  file  Transportation Needs: Not on file  Physical Activity: Not on file  Stress: Not on file  Social Connections: Not on file  Intimate Partner Violence: Not on file    Outpatient Medications Prior to Visit  Medication Sig Dispense Refill   aspirin-sod bicarb-citric acid (ALKA-SELTZER) 325 MG TBEF tablet Take 325 mg by mouth every 6 (six) hours as needed.     clonazePAM (KLONOPIN) 1 MG tablet Take 1 tablet by mouth daily as needed.     glipiZIDE (GLUCOTROL) 10 MG tablet Take 2 tablets (20 mg total) by mouth 2 (two) times daily. (Patient taking differently: Take 10 mg by mouth 2 (two) times daily.) 120 tablet 3   Insulin Pen Needle 32G X 4 MM MISC 1 each by Does not apply route daily. 90 each 3   omeprazole (PRILOSEC) 40 MG capsule Take 1 capsule (40 mg total) by mouth daily. 90 capsule 0   insulin glargine (LANTUS) 100 UNIT/ML Solostar Pen Inject 22 Units into the skin daily. 15 mL 11   No facility-administered medications prior to visit.    Allergies  Allergen Reactions   Dm-Doxylamine-Acetaminophen Hives and Shortness Of Breath    "Nyquill" and "  Vicks 44-D" Hives States difficulty breathing    Dextromethorphan    Vicks Dayquil-Nyquil Cld & Flu [Pe-Dm-Apap & Doxylamin-Dm-Apap] Other (See Comments)    "overly sedated feeling'    Review of Systems  Constitutional: Negative.   HENT: Negative.    Eyes: Negative.   Respiratory: Negative.    Cardiovascular: Negative.   Gastrointestinal: Negative.   Endocrine:       Elevated blood sugars  Genitourinary: Negative.   Musculoskeletal: Negative.   Skin: Negative.   Allergic/Immunologic: Negative.   Neurological: Negative.   Hematological: Negative.   Psychiatric/Behavioral: Negative.        Objective:    Physical Exam Constitutional:      Appearance: Normal appearance. He is obese.  HENT:     Head: Normocephalic and atraumatic.     Right Ear: Tympanic membrane, ear canal and external ear normal.     Left Ear: Tympanic  membrane, ear canal and external ear normal.     Nose: Nose normal.     Mouth/Throat:     Mouth: Mucous membranes are moist.     Pharynx: Oropharynx is clear.  Eyes:     Extraocular Movements: Extraocular movements intact.     Conjunctiva/sclera: Conjunctivae normal.     Pupils: Pupils are equal, round, and reactive to light.  Cardiovascular:     Rate and Rhythm: Normal rate and regular rhythm.     Pulses: Normal pulses.     Heart sounds: Normal heart sounds.  Pulmonary:     Effort: Pulmonary effort is normal.     Breath sounds: Normal breath sounds.  Abdominal:     General: Abdomen is flat. Bowel sounds are normal.     Palpations: Abdomen is soft.  Musculoskeletal:        General: Normal range of motion.     Cervical back: Normal range of motion and neck supple.  Skin:    General: Skin is warm and dry.     Capillary Refill: Capillary refill takes less than 2 seconds.  Neurological:     General: No focal deficit present.     Mental Status: He is alert and oriented to person, place, and time.     Cranial Nerves: No cranial nerve deficit.     Sensory: No sensory deficit.     Motor: No weakness.     Coordination: Coordination normal.     Gait: Gait normal.  Psychiatric:        Mood and Affect: Mood normal.        Behavior: Behavior normal.        Thought Content: Thought content normal.        Judgment: Judgment normal.    BP (!) 149/85    Pulse 86    Ht $R'5\' 6"'Ll$  (1.676 m)    Wt 292 lb 1.9 oz (132.5 kg)    SpO2 98%    BMI 47.15 kg/m  Wt Readings from Last 3 Encounters:  08/14/21 292 lb 1.9 oz (132.5 kg)  05/15/21 300 lb (136.1 kg)  11/30/20 293 lb 6.4 oz (133.1 kg)    Health Maintenance Due  Topic Date Due   Pneumococcal Vaccine 71-50 Years old (1 - PCV) Never done   OPHTHALMOLOGY EXAM  05/27/2021   HEMOGLOBIN A1C  06/01/2021   URINE MICROALBUMIN  10/05/2021    There are no preventive care reminders to display for this patient.   No results found for: TSH Lab  Results  Component Value Date   WBC 8.3  08/23/2020   HGB 13.4 08/23/2020   HCT 42.3 08/23/2020   MCV 86 08/23/2020   PLT 342 08/23/2020   Lab Results  Component Value Date   NA 140 11/30/2020   K 4.4 11/30/2020   CO2 30 11/30/2020   GLUCOSE 165 (H) 11/30/2020   BUN 17 11/30/2020   CREATININE 1.33 11/30/2020   BILITOT 0.5 11/30/2020   ALKPHOS 145 (H) 08/23/2020   AST 16 11/30/2020   ALT 23 11/30/2020   PROT 6.8 11/30/2020   ALBUMIN 3.5 (L) 08/23/2020   CALCIUM 9.1 11/30/2020   ANIONGAP 10 08/17/2020   Lab Results  Component Value Date   CHOL 173 11/30/2020   Lab Results  Component Value Date   HDL 35 (L) 11/30/2020   Lab Results  Component Value Date   LDLCALC 111 (H) 11/30/2020   Lab Results  Component Value Date   TRIG 157 (H) 11/30/2020   Lab Results  Component Value Date   CHOLHDL 4.9 11/30/2020   Lab Results  Component Value Date   HGBA1C 8.4 (H) 11/30/2020       Assessment & Plan:   Problem List Items Addressed This Visit       Endocrine   Type 2 diabetes mellitus with hyperglycemia ([place])    -he states he had some irritation with lantus injections at the site of injection -check A1c today -STOP lantus d/t injection site pain -Rx. tresiba -checking lipids; may consider statin if LDL > 70 -olmesartan started today -may consider GLP-1 or mounjaro based on A1c and const-effectiveness      Relevant Medications   insulin degludec (TRESIBA FLEXTOUCH) 200 UNIT/ML FlexTouch Pen   olmesartan (BENICAR) 20 MG tablet   Other Relevant Orders   CBC with Differential/Platelet   CMP14+EGFR   Lipid Panel With LDL/HDL Ratio   Hemoglobin A1c     Other   Encounter for general adult medical examination with abnormal findings - Primary    -obesity; elevated BP -otherwise exam unremarkable      Elevated BP without diagnosis of hypertension    BP Readings from Last 3 Encounters:  08/14/21 (!) 149/85  05/15/21 (!) 146/82  02/21/21 (!) 162/99  -Rx.  olmesartan      Relevant Medications   olmesartan (BENICAR) 20 MG tablet     Meds ordered this encounter  Medications   insulin degludec (TRESIBA FLEXTOUCH) 200 UNIT/ML FlexTouch Pen    Sig: Inject 26 Units into the skin at bedtime.    Dispense:  6 mL    Refill:  2   olmesartan (BENICAR) 20 MG tablet    Sig: Take 1 tablet (20 mg total) by mouth daily.    Dispense:  90 tablet    Refill:  [place], NP

## 2021-08-14 NOTE — Patient Instructions (Signed)
Please have fasting labs drawn today. OK to use labs that are already in the computer.  I will be moving to [facility] located at [address] [place], [place] [address] effective Aug 27, 2021. If you would like to establish care with [facility] please call [phone].

## 2021-08-14 NOTE — Assessment & Plan Note (Addendum)
-  obesity; elevated BP -otherwise exam unremarkable -PREVNAR-20 administered today

## 2021-08-15 ENCOUNTER — Other Ambulatory Visit: Payer: Self-pay | Admitting: Nurse Practitioner

## 2021-08-15 DIAGNOSIS — E1165 Type 2 diabetes mellitus with hyperglycemia: Secondary | ICD-10-CM

## 2021-08-15 LAB — CMP14+EGFR
ALT: 27 IU/L (ref 0–44)
AST: 15 IU/L (ref 0–40)
Albumin/Globulin Ratio: 1.4 (ref 1.2–2.2)
Albumin: 4 g/dL (ref 4.0–5.0)
Alkaline Phosphatase: 125 IU/L — ABNORMAL HIGH (ref 44–121)
BUN/Creatinine Ratio: 12 (ref 9–20)
BUN: 16 mg/dL (ref 6–24)
Bilirubin Total: 0.4 mg/dL (ref 0.0–1.2)
CO2: 26 mmol/L (ref 20–29)
Calcium: 9.2 mg/dL (ref 8.7–10.2)
Chloride: 97 mmol/L (ref 96–106)
Creatinine, Ser: 1.29 mg/dL — ABNORMAL HIGH (ref 0.76–1.27)
Globulin, Total: 2.8 g/dL (ref 1.5–4.5)
Glucose: 332 mg/dL — ABNORMAL HIGH (ref 70–99)
Potassium: 4.6 mmol/L (ref 3.5–5.2)
Sodium: 135 mmol/L (ref 134–144)
Total Protein: 6.8 g/dL (ref 6.0–8.5)
eGFR: 68 mL/min/{1.73_m2} (ref >59)

## 2021-08-15 LAB — HEMOGLOBIN A1C
Est. average glucose Bld gHb Est-mCnc: 266 mg/dL
Hgb A1c MFr Bld: 10.9 % — ABNORMAL HIGH (ref 4.8–5.6)

## 2021-08-15 LAB — CBC WITH DIFFERENTIAL/PLATELET
Basophils Absolute: 0 10*3/uL (ref 0.0–0.2)
Basos: 1 %
EOS (ABSOLUTE): 0.3 10*3/uL (ref 0.0–0.4)
Eos: 3 %
Hematocrit: 45.4 % (ref 37.5–51.0)
Hemoglobin: 15.3 g/dL (ref 13.0–17.7)
Immature Grans (Abs): 0 10*3/uL (ref 0.0–0.1)
Immature Granulocytes: 1 %
Lymphocytes Absolute: 1.6 10*3/uL (ref 0.7–3.1)
Lymphs: 19 %
MCH: 28.1 pg (ref 26.6–33.0)
MCHC: 33.7 g/dL (ref 31.5–35.7)
MCV: 83 fL (ref 79–97)
Monocytes Absolute: 0.4 10*3/uL (ref 0.1–0.9)
Monocytes: 5 %
Neutrophils Absolute: 6.2 10*3/uL (ref 1.4–7.0)
Neutrophils: 71 %
Platelets: 166 10*3/uL (ref 150–450)
RBC: 5.45 x10E6/uL (ref 4.14–5.80)
RDW: 12.6 % (ref 11.6–15.4)
WBC: 8.7 10*3/uL (ref 3.4–10.8)

## 2021-08-15 LAB — LIPID PANEL WITH LDL/HDL RATIO
Cholesterol, Total: 178 mg/dL (ref 100–199)
HDL: 30 mg/dL — ABNORMAL LOW (ref >39)
LDL Chol Calc (NIH): 101 mg/dL — ABNORMAL HIGH (ref 0–99)
LDL/HDL Ratio: 3.4 ratio (ref 0.0–3.6)
Triglycerides: 273 mg/dL — ABNORMAL HIGH (ref 0–149)
VLDL Cholesterol Cal: 47 mg/dL — ABNORMAL HIGH (ref 5–40)

## 2021-08-15 MED ORDER — TRULICITY 1.5 MG/0.5ML ~~LOC~~ SOAJ: 1.5000 mg | SUBCUTANEOUS | 1 refills | Status: DC

## 2021-08-15 MED ORDER — TRULICITY 0.75 MG/0.5ML ~~LOC~~ SOAJ: 0.7500 mg | SUBCUTANEOUS | 0 refills | Status: DC

## 2021-08-15 MED ORDER — INSULIN PEN NEEDLE 32G X 4 MM MISC: 1.0000 | Freq: Every day | 3 refills | Status: DC

## 2021-08-15 MED ORDER — GLIPIZIDE 10 MG PO TABS: 10.0000 mg | ORAL_TABLET | Freq: Two times a day (BID) | ORAL | 3 refills | Status: DC

## 2021-08-15 NOTE — Progress Notes (Signed)
His blood sugar and A1c were both elevated. I sent in a Rx. for Trulicity that should help control his blood sugar. Add that to what he is currently taking. If that is expensive, call us and we may be able to change the prescription.

## 2021-08-16 ENCOUNTER — Telehealth: Payer: Self-pay

## 2021-08-16 ENCOUNTER — Telehealth: Payer: Self-pay | Admitting: Nurse Practitioner

## 2021-08-16 ENCOUNTER — Other Ambulatory Visit: Payer: Self-pay | Admitting: Nurse Practitioner

## 2021-08-16 DIAGNOSIS — E1165 Type 2 diabetes mellitus with hyperglycemia: Secondary | ICD-10-CM

## 2021-08-16 MED ORDER — TOUJEO MAX SOLOSTAR 300 UNIT/ML ~~LOC~~ SOPN: 27.0000 [IU] | PEN_INJECTOR | Freq: Every day | SUBCUTANEOUS | 11 refills | Status: DC

## 2021-08-16 NOTE — Telephone Encounter (Signed)
Pt needs the insulin , sugars have been high A1C 10.7  Per last tele

## 2021-08-16 NOTE — Telephone Encounter (Signed)
Patient called said the prescription (Insulin Pen Needle 32G X 4 MM MISC) that was called into [facility] is not covered by his insurance.   Patient quite did not understand the RX he signed up for, asked if nurse can give him a call back at [phone]

## 2021-08-16 NOTE — Telephone Encounter (Signed)
See previous msg sent to gray to advise

## 2021-08-16 NOTE — Progress Notes (Signed)
-  Gary Thompson was too expensive -Rx. Toujeo; that may be preferred with his insurance?

## 2021-08-16 NOTE — Telephone Encounter (Signed)
I sent in [place]. If that is too expensive, he will need to call his insurance and ask their pharmacy team what is preferred for long acting insulin if he is having burning with Lantus.

## 2021-08-16 NOTE — Telephone Encounter (Signed)
Called in on of behalf ,   Pt has been prescribed 2 different insulins and pt insurance does not cover either  Wants to see if pt can get another script for an insulin covered by insurance

## 2021-08-17 NOTE — Telephone Encounter (Signed)
Patient aware.

## 2021-08-23 ENCOUNTER — Telehealth: Payer: Self-pay

## 2021-08-23 NOTE — Telephone Encounter (Signed)
Patient aware.

## 2021-08-23 NOTE — Telephone Encounter (Signed)
Novolog is fast-acting insulin (kickis in in 15 minutes and lasts 2-4 hours). Toujeo is a long-acting insulin (kicks in in 1-2 hours and last 24 hours), so they aren't interchangeable like that. Have him call his insurance company and ask what basal or long-acting insulin is on their formulary (medication list). I'll call in whatever they will pay for.

## 2021-08-23 NOTE — Telephone Encounter (Signed)
Pt called in about    insulin glargine, 2 Unit Dial, (TOUJEO MAX SOLOSTAR) 300 UNIT/ML Solostar Pen   Pt states that insulin is too expensive still Wants to see if he can get a less expensive   Pt states that the generic novolog is less expensive and would like try

## 2021-08-24 ENCOUNTER — Telehealth: Payer: Self-pay | Admitting: Nurse Practitioner

## 2021-08-24 NOTE — Telephone Encounter (Signed)
Does he need the Toujeo sent in?

## 2021-08-24 NOTE — Telephone Encounter (Signed)
Pt called in regard to Gary Thompson   Pt states that insurance wont cover [name] without first trying the [facility] for at least 3 months   Pt states they will try the [place] until

## 2021-08-24 NOTE — Telephone Encounter (Signed)
That's what I thought too

## 2021-08-28 ENCOUNTER — Encounter: Payer: Self-pay | Admitting: Internal Medicine

## 2021-08-28 ENCOUNTER — Ambulatory Visit (INDEPENDENT_AMBULATORY_CARE_PROVIDER_SITE_OTHER): Payer: 59 | Admitting: Internal Medicine

## 2021-08-28 DIAGNOSIS — J0110 Acute frontal sinusitis, unspecified: Secondary | ICD-10-CM | POA: Diagnosis not present

## 2021-08-28 DIAGNOSIS — E1165 Type 2 diabetes mellitus with hyperglycemia: Secondary | ICD-10-CM

## 2021-08-28 DIAGNOSIS — Z794 Long term (current) use of insulin: Secondary | ICD-10-CM | POA: Diagnosis not present

## 2021-08-28 MED ORDER — AZITHROMYCIN 250 MG PO TABS: ORAL_TABLET | ORAL | 0 refills | Status: AC

## 2021-08-28 NOTE — Progress Notes (Signed)
Virtual Visit via Telephone Note   This visit type was conducted due to national recommendations for restrictions regarding the COVID-19 Pandemic (e.g. social distancing) in an effort to limit this patient's exposure and mitigate transmission in our community.  Due to his co-morbid illnesses, this patient is at least at moderate risk for complications without adequate follow up.  This format is felt to be most appropriate for this patient at this time.  The patient did not have access to video technology/had technical difficulties with video requiring transitioning to audio format only (telephone).  All issues noted in this document were discussed and addressed.  No physical exam could be performed with this format.  Evaluation Performed:  Follow-up visit  Date:  08/28/2021   ID:  Gary Thompson, DOB 08-27-1971, MRN [id number]  Patient Location: Home Provider Location: Office/Clinic  Participants: Patient Location of Patient: Home Location of Provider: Telehealth Consent was obtain for visit to be over via telehealth. I verified that I am speaking with the correct person using two identifiers.  PCP:  No primary care provider on file.   Chief Complaint: Sinus pressure related headache and fever  History of Present Illness:    Gary Thompson is a 51 y.o. male who has a televisit for complaint of sinus pressure related headache, fever, cough and nasal congestion for about last 4 days.  He is home COVID test was negative twice.  He denies any dyspnea or wheezing currently.  He has tried taking TheraFlu and Advil Cold and Sinus with minimal relief.  His girlfriend also has similar symptoms currently.  The patient does not have symptoms concerning for COVID-19 infection (fever, chills, cough, or new shortness of breath).   Past Medical, Surgical, Social History, Allergies, and Medications have been Reviewed.  Past Medical History:  Diagnosis Date   Anxiety    Diabetes mellitus  without complication ([place])    Sleep apnea    Vertigo    Past Surgical History:  Procedure Laterality Date   CHOLECYSTECTOMY N/A 08/11/2020   Procedure: LAPAROSCOPIC CHOLECYSTECTOMY;  Surgeon: Virl Cagey, MD;  Location: [facility];  Service: General;  Laterality: N/A;   GALLBLADDER SURGERY     KNEE SURGERY     UMBILICAL HERNIA REPAIR N/A 08/11/2020   Procedure: HERNIA REPAIR UMBILICAL ADULT;  Surgeon: Virl Cagey, MD;  Location: [facility];  Service: General;  Laterality: N/A;     Current Meds  Medication Sig   aspirin-sod bicarb-citric acid (ALKA-SELTZER) 325 MG TBEF tablet Take 325 mg by mouth every 6 (six) hours as needed.   azithromycin (ZITHROMAX) 250 MG tablet Take 2 tablets on day 1, then 1 tablet daily on days 2 through 5   clonazePAM (KLONOPIN) 1 MG tablet Take 1 tablet by mouth daily as needed.   Dulaglutide (TRULICITY) 7.98 XQ/1.1HE SOPN Inject 0.75 mg into the skin once a week.   [START ON 09/12/2021] Dulaglutide (TRULICITY) 1.5 RD/4.0CX SOPN Inject 1.5 mg into the skin once a week.   glipiZIDE (GLUCOTROL) 10 MG tablet Take 1 tablet (10 mg total) by mouth 2 (two) times daily.   insulin glargine, 2 Unit Dial, (TOUJEO MAX SOLOSTAR) 300 UNIT/ML Solostar Pen Inject 27 Units into the skin at bedtime.   Insulin Pen Needle 32G X 4 MM MISC 1 each by Does not apply route daily.   olmesartan (BENICAR) 20 MG tablet Take 1 tablet (20 mg total) by mouth daily.   omeprazole (PRILOSEC) 40 MG capsule Take 1 capsule (40  mg total) by mouth daily.     Allergies:   Dm-doxylamine-acetaminophen, Dextromethorphan, and Vicks dayquil-nyquil cld & flu [pe-dm-apap & doxylamin-dm-apap]   ROS:   Please see the history of present illness.     All other systems reviewed and are negative.   Labs/Other Tests and Data Reviewed:    Recent Labs: 08/14/2021: ALT 27; BUN 16; Creatinine, Ser 1.29; Hemoglobin 15.3; Platelets 166; Potassium 4.6; Sodium 135   Recent Lipid Panel Lab Results   Component Value Date/Time   CHOL 178 08/14/2021 09:07 AM   TRIG 273 (H) 08/14/2021 09:07 AM   HDL 30 (L) 08/14/2021 09:07 AM   CHOLHDL 4.9 11/30/2020 03:46 PM   LDLCALC 101 (H) 08/14/2021 09:07 AM   LDLCALC 111 (H) 11/30/2020 03:46 PM    Wt Readings from Last 3 Encounters:  08/14/21 292 lb 1.9 oz (132.5 kg)  05/15/21 300 lb (136.1 kg)  11/30/20 293 lb 6.4 oz (133.1 kg)     ASSESSMENT & PLAN:    Acute sinusitis Home COVID test negative Started azithromycin as he has persistent symptoms despite symptomatic treatment Continue Advil Cold and Sinus as needed Advised to use nasal saline spray as needed  Type 2 diabetes mellitus with hyperglycemia ([place]) Was recently started on Toujeo, has not started using it yet Takes glipizide 10 mg twice daily Advised to start Toujeo as prescribed   Time:   Today, I have spent 15 minutes reviewing the chart, including problem list, medications, and with the patient with telehealth technology discussing the above problems.   Medication Adjustments/Labs and Tests Ordered: Current medicines are reviewed at length with the patient today.  Concerns regarding medicines are outlined above.   Tests Ordered: No orders of the defined types were placed in this encounter.   Medication Changes: Meds ordered this encounter  Medications   azithromycin (ZITHROMAX) 250 MG tablet    Sig: Take 2 tablets on day 1, then 1 tablet daily on days 2 through 5    Dispense:  6 tablet    Refill:  0     Note: This dictation was prepared with Dragon dictation along with smaller phrase technology. Similar sounding words can be transcribed inadequately or may not be corrected upon review. Any transcriptional errors that result from this process are unintentional.      Disposition:  Follow up  Signed, Lindell Spar, MD  08/28/2021 9:49 AM     [place]

## 2021-08-28 NOTE — Assessment & Plan Note (Signed)
Was recently started on Toujeo, has not started using it yet Takes glipizide 10 mg twice daily Advised to start Toujeo as prescribed

## 2021-08-30 ENCOUNTER — Telehealth: Payer: Self-pay | Admitting: Internal Medicine

## 2021-08-30 ENCOUNTER — Encounter: Payer: Self-pay | Admitting: *Deleted

## 2021-08-30 NOTE — Telephone Encounter (Signed)
Work note provided.

## 2021-08-30 NOTE — Telephone Encounter (Signed)
Pt called in for work note   Pt would like to return ti work tomorrow pt states feeling better   Pt would like note from Friday - today   Pt would like note via Mychart

## 2021-09-04 ENCOUNTER — Ambulatory Visit (INDEPENDENT_AMBULATORY_CARE_PROVIDER_SITE_OTHER): Payer: 59 | Admitting: Internal Medicine

## 2021-09-04 ENCOUNTER — Other Ambulatory Visit: Payer: Self-pay

## 2021-09-04 ENCOUNTER — Encounter: Payer: Self-pay | Admitting: Internal Medicine

## 2021-09-04 DIAGNOSIS — J0110 Acute frontal sinusitis, unspecified: Secondary | ICD-10-CM

## 2021-09-04 DIAGNOSIS — R059 Cough, unspecified: Secondary | ICD-10-CM | POA: Diagnosis not present

## 2021-09-04 MED ORDER — MOXIFLOXACIN HCL 400 MG PO TABS: 400.0000 mg | ORAL_TABLET | Freq: Every day | ORAL | 0 refills | Status: AC

## 2021-09-04 MED ORDER — GUAIFENESIN-CODEINE 100-10 MG/5ML PO SYRP: 5.0000 mL | ORAL_SOLUTION | Freq: Three times a day (TID) | ORAL | 0 refills | Status: DC | PRN

## 2021-09-04 NOTE — Progress Notes (Signed)
Virtual Visit via Telephone Note   This visit type was conducted due to national recommendations for restrictions regarding the COVID-19 Pandemic (e.g. social distancing) in an effort to limit this patient's exposure and mitigate transmission in our community.  Due to his co-morbid illnesses, this patient is at least at moderate risk for complications without adequate follow up.  This format is felt to be most appropriate for this patient at this time.  The patient did not have access to video technology/had technical difficulties with video requiring transitioning to audio format only (telephone).  All issues noted in this document were discussed and addressed.  No physical exam could be performed with this format.  Evaluation Performed:  Follow-up visit  Date:  09/04/2021   ID:  Gary Thompson, DOB 07/21/1971, MRN [id number]  Patient Location: Home Provider Location: Office/Clinic  Participants: Patient Location of Patient: Home Location of Provider: Telehealth Consent was obtain for visit to be over via telehealth. I verified that I am speaking with the correct person using two identifiers.  PCP:  Lindell Spar, MD   Chief Complaint: Cough, nasal congestion and sinus pressure related headache  History of Present Illness:    Gary Thompson is a 51 y.o. male who has a televisit for complaint of cough, nasal congestion and sinus pressure related headache for last 2 weeks.  He recently completed azithromycin with minimal relief with his symptoms.  He attributes his symptoms to olmesartan and has stopped taking it.  I advised him that his symptoms are less likely to be related to olmesartan.  He denies any fever, chills, or wheezing currently.  The patient does not have symptoms concerning for COVID-19 infection (fever, chills, cough, or new shortness of breath).   Past Medical, Surgical, Social History, Allergies, and Medications have been Reviewed.  Past Medical History:   Diagnosis Date   Anxiety    Diabetes mellitus without complication ([place])    Sleep apnea    Vertigo    Past Surgical History:  Procedure Laterality Date   CHOLECYSTECTOMY N/A 08/11/2020   Procedure: LAPAROSCOPIC CHOLECYSTECTOMY;  Surgeon: Virl Cagey, MD;  Location: [facility];  Service: General;  Laterality: N/A;   GALLBLADDER SURGERY     KNEE SURGERY     UMBILICAL HERNIA REPAIR N/A 08/11/2020   Procedure: HERNIA REPAIR UMBILICAL ADULT;  Surgeon: Virl Cagey, MD;  Location: [facility];  Service: General;  Laterality: N/A;     Current Meds  Medication Sig   aspirin-sod bicarb-citric acid (ALKA-SELTZER) 325 MG TBEF tablet Take 325 mg by mouth every 6 (six) hours as needed.   clonazePAM (KLONOPIN) 1 MG tablet Take 1 tablet by mouth daily as needed.   Dulaglutide (TRULICITY) 3.55 DD/2.2GU SOPN Inject 0.75 mg into the skin once a week.   [START ON 09/12/2021] Dulaglutide (TRULICITY) 1.5 RK/2.7CW SOPN Inject 1.5 mg into the skin once a week.   glipiZIDE (GLUCOTROL) 10 MG tablet Take 1 tablet (10 mg total) by mouth 2 (two) times daily.   guaiFENesin-codeine (ROBITUSSIN AC) 100-10 MG/5ML syrup Take 5 mLs by mouth 3 (three) times daily as needed for cough.   insulin glargine, 2 Unit Dial, (TOUJEO MAX SOLOSTAR) 300 UNIT/ML Solostar Pen Inject 27 Units into the skin at bedtime.   Insulin Pen Needle 32G X 4 MM MISC 1 each by Does not apply route daily.   moxifloxacin (AVELOX) 400 MG tablet Take 1 tablet (400 mg total) by mouth daily for 7 days.   olmesartan (  BENICAR) 20 MG tablet Take 1 tablet (20 mg total) by mouth daily.   omeprazole (PRILOSEC) 40 MG capsule Take 1 capsule (40 mg total) by mouth daily.     Allergies:   Dm-doxylamine-acetaminophen, Dextromethorphan, and Vicks dayquil-nyquil cld & flu [pe-dm-apap & doxylamin-dm-apap]   ROS:   Please see the history of present illness.     All other systems reviewed and are negative.   Labs/Other Tests and Data Reviewed:    Recent  Labs: 08/14/2021: ALT 27; BUN 16; Creatinine, Ser 1.29; Hemoglobin 15.3; Platelets 166; Potassium 4.6; Sodium 135   Recent Lipid Panel Lab Results  Component Value Date/Time   CHOL 178 08/14/2021 09:07 AM   TRIG 273 (H) 08/14/2021 09:07 AM   HDL 30 (L) 08/14/2021 09:07 AM   CHOLHDL 4.9 11/30/2020 03:46 PM   LDLCALC 101 (H) 08/14/2021 09:07 AM   LDLCALC 111 (H) 11/30/2020 03:46 PM    Wt Readings from Last 3 Encounters:  08/14/21 292 lb 1.9 oz (132.5 kg)  05/15/21 300 lb (136.1 kg)  11/30/20 293 lb 6.4 oz (133.1 kg)    ASSESSMENT & PLAN:    Acute sinusitis Started moxifloxacin for persistent sinusitis Cheratussin as needed for cough Advised to use nasal saline spray as needed Advised to use humidifier at nighttime  Time:   Today, I have spent 9 minutes reviewing the chart, including problem list, medications, and with the patient with telehealth technology discussing the above problems.   Medication Adjustments/Labs and Tests Ordered: Current medicines are reviewed at length with the patient today.  Concerns regarding medicines are outlined above.   Tests Ordered: No orders of the defined types were placed in this encounter.   Medication Changes: Meds ordered this encounter  Medications   moxifloxacin (AVELOX) 400 MG tablet    Sig: Take 1 tablet (400 mg total) by mouth daily for 7 days.    Dispense:  7 tablet    Refill:  0   guaiFENesin-codeine (ROBITUSSIN AC) 100-10 MG/5ML syrup    Sig: Take 5 mLs by mouth 3 (three) times daily as needed for cough.    Dispense:  120 mL    Refill:  0     Note: This dictation was prepared with Dragon dictation along with smaller phrase technology. Similar sounding words can be transcribed inadequately or may not be corrected upon review. Any transcriptional errors that result from this process are unintentional.      Disposition:  Follow up  Signed, Lindell Spar, MD  09/04/2021 4:44 PM     [place]

## 2021-09-29 ENCOUNTER — Other Ambulatory Visit (INDEPENDENT_AMBULATORY_CARE_PROVIDER_SITE_OTHER): Payer: Self-pay | Admitting: Nurse Practitioner

## 2021-09-29 ENCOUNTER — Telehealth: Payer: Self-pay

## 2021-09-29 ENCOUNTER — Telehealth: Payer: Self-pay | Admitting: Internal Medicine

## 2021-09-29 ENCOUNTER — Other Ambulatory Visit: Payer: Self-pay | Admitting: Family Medicine

## 2021-09-29 DIAGNOSIS — E1165 Type 2 diabetes mellitus with hyperglycemia: Secondary | ICD-10-CM

## 2021-09-29 MED ORDER — NOVOLIN 70/30 FLEXPEN (70-30) 100 UNIT/ML ~~LOC~~ SUPN: PEN_INJECTOR | SUBCUTANEOUS | 0 refills | Status: DC

## 2021-09-29 NOTE — Telephone Encounter (Signed)
Note sent to Dr Moshe Cipro to advise

## 2021-09-29 NOTE — Telephone Encounter (Signed)
Advice request sent to Dr Moshe Cipro

## 2021-09-29 NOTE — Telephone Encounter (Signed)
Patient called states blood sugar is running in the 400's patient was unable to afford both insulins called in before and is requesting a cheaper alternative ... patient states [facility] brand insulin is cheaper please advise ?

## 2021-09-29 NOTE — Telephone Encounter (Signed)
Girlfriend called for patient, patient at work, still unable to get insulin. Sugar running in the 400 range.  Please contact patient [phone].

## 2021-09-29 NOTE — Telephone Encounter (Signed)
Pt LVM returning a call   Pt phone dropped the call due to an update

## 2021-10-30 ENCOUNTER — Encounter (HOSPITAL_COMMUNITY): Payer: Self-pay

## 2021-10-30 ENCOUNTER — Emergency Department (HOSPITAL_COMMUNITY): Payer: 59

## 2021-10-30 ENCOUNTER — Other Ambulatory Visit: Payer: Self-pay

## 2021-10-30 ENCOUNTER — Emergency Department (HOSPITAL_COMMUNITY)
Admission: EM | Admit: 2021-10-30 | Discharge: 2021-10-30 | Disposition: A | Discharge status: Home / Self Care | Payer: 59 | Attending: Emergency Medicine | Admitting: Emergency Medicine

## 2021-10-30 DIAGNOSIS — E119 Type 2 diabetes mellitus without complications: Secondary | ICD-10-CM | POA: Diagnosis not present

## 2021-10-30 DIAGNOSIS — Z7982 Long term (current) use of aspirin: Secondary | ICD-10-CM | POA: Diagnosis not present

## 2021-10-30 DIAGNOSIS — R0789 Other chest pain: Secondary | ICD-10-CM | POA: Diagnosis not present

## 2021-10-30 DIAGNOSIS — Z7984 Long term (current) use of oral hypoglycemic drugs: Secondary | ICD-10-CM | POA: Diagnosis not present

## 2021-10-30 DIAGNOSIS — Z794 Long term (current) use of insulin: Secondary | ICD-10-CM | POA: Insufficient documentation

## 2021-10-30 DIAGNOSIS — R079 Chest pain, unspecified: Secondary | ICD-10-CM | POA: Diagnosis present

## 2021-10-30 MED ORDER — OXYCODONE-ACETAMINOPHEN 5-325 MG PO TABS: ORAL_TABLET | ORAL | 0 refills | Status: DC

## 2021-10-30 MED ORDER — IBUPROFEN 800 MG PO TABS: 800.0000 mg | ORAL_TABLET | Freq: Three times a day (TID) | ORAL | 0 refills | Status: DC | PRN

## 2021-10-30 NOTE — ED Provider Notes (Signed)
?[facility] ?Provider Note ? ? ?CSN: [id number] ?Arrival date & time: 10/30/21  1443 ? ?  ? ?History ? ?Chief Complaint  ?Patient presents with  ? Rib Injury  ? ? ?Gary Thompson is a 51 y.o. male. ? ?Patient was working on cars and felt a pop in his right chest with pain.  Patient is having pain lateral right chest.  Patient has history of diabetes ? ?The history is provided by the patient and medical records. No language interpreter was used.  ?Chest Pain ?Pain location:  R chest ?Pain quality: aching   ?Pain severity:  Moderate ?Onset quality:  Sudden ?Timing:  Constant ?Progression:  Worsening ?Chronicity:  New ?Context: not breathing   ?Relieved by:  Nothing ?Worsened by:  Movement ?Associated symptoms: no abdominal pain, no back pain, no cough, no fatigue and no headache   ? ?  ? ?Home Medications ?Prior to Admission medications   ?Medication Sig Start Date End Date Taking? Authorizing Provider  ?ibuprofen (ADVIL) 800 MG tablet Take 1 tablet (800 mg total) by mouth every 8 (eight) hours as needed for moderate pain. 10/30/21  Yes Milton Ferguson, MD  ?oxyCODONE-acetaminophen (PERCOCET/ROXICET) 5-325 MG tablet Take 1 every 6 hours if the Motrin 800 mg does not help with the pain 10/30/21  Yes Milton Ferguson, MD  ?aspirin-sod bicarb-citric acid (ALKA-SELTZER) 325 MG TBEF tablet Take 325 mg by mouth every 6 (six) hours as needed.    [provider]  ?clonazePAM (KLONOPIN) 1 MG tablet Take 1 tablet by mouth daily as needed. 07/29/20   [provider]  ?Dulaglutide (TRULICITY) 1.54 MG/8.6PY SOPN Inject 0.75 mg into the skin once a week. 08/15/21   Noreene Larsson, NP  ?Dulaglutide (TRULICITY) 1.5 PP/5.0DT SOPN Inject 1.5 mg into the skin once a week. 09/12/21   Noreene Larsson, NP  ?glipiZIDE (GLUCOTROL) 10 MG tablet Take 1 tablet (10 mg total) by mouth 2 (two) times daily. 08/15/21   Noreene Larsson, NP  ?guaiFENesin-codeine [facility]) 100-10 MG/5ML syrup Take 5 mLs by mouth 3  (three) times daily as needed for cough. 09/04/21   Lindell Spar, MD  ?insulin isophane & regular human KwikPen (NOVOLIN 70/30 KWIKPEN) (70-30) 100 UNIT/ML KwikPen Inject 10 units under the skin every morning and 5 units every evening 09/29/21   Fayrene Helper, MD  ?Insulin Pen Needle 32G X 4 MM MISC 1 each by Does not apply route daily. 08/15/21   Noreene Larsson, NP  ?olmesartan (BENICAR) 20 MG tablet Take 1 tablet (20 mg total) by mouth daily. 08/14/21   Noreene Larsson, NP  ?omeprazole (PRILOSEC) 40 MG capsule Take 1 capsule (40 mg total) by mouth daily. 07/13/21   Fayrene Helper, MD  ?   ? ?Allergies    ?Dm-doxylamine-acetaminophen, Dextromethorphan, and Vicks dayquil-nyquil cld & flu [pe-dm-apap & doxylamin-dm-apap]   ? ?Review of Systems   ?Review of Systems  ?Constitutional:  Negative for appetite change and fatigue.  ?HENT:  Negative for congestion, ear discharge and sinus pressure.   ?Eyes:  Negative for discharge.  ?Respiratory:  Negative for cough.   ?Cardiovascular:  Positive for chest pain.  ?Gastrointestinal:  Negative for abdominal pain and diarrhea.  ?Genitourinary:  Negative for frequency and hematuria.  ?Musculoskeletal:  Negative for back pain.  ?Skin:  Negative for rash.  ?Neurological:  Negative for seizures and headaches.  ?Psychiatric/Behavioral:  Negative for hallucinations.   ? ?Physical Exam ?Updated Vital Signs ?BP (!) 150/78 (BP  Location: Left Arm)   Pulse 76   Temp 97.9 ?F (36.6 ?C) (Oral)   Resp 18   Ht '5\' 8"'$  (1.727 m)   Wt 131.5 kg   SpO2 99%   BMI 44.09 kg/m?  ?Physical Exam ?Vitals and nursing note reviewed.  ?Constitutional:   ?   Appearance: He is well-developed.  ?HENT:  ?   Head: Normocephalic.  ?Eyes:  ?   General: No scleral icterus. ?   Conjunctiva/sclera: Conjunctivae normal.  ?Neck:  ?   Thyroid: No thyromegaly.  ?Cardiovascular:  ?   Rate and Rhythm: Normal rate and regular rhythm.  ?   Heart sounds: No murmur heard. ?  No friction rub. No gallop.   ?Pulmonary:  ?   Breath sounds: No stridor. No wheezing or rales.  ?Chest:  ?   Chest wall: No tenderness.  ?Abdominal:  ?   General: There is no distension.  ?   Tenderness: There is no abdominal tenderness. There is no rebound.  ?Musculoskeletal:     ?   General: Normal range of motion.  ?   Cervical back: Neck supple.  ?   Comments: Tender right chest  ?Lymphadenopathy:  ?   Cervical: No cervical adenopathy.  ?Skin: ?   Findings: No erythema or rash.  ?Neurological:  ?   Mental Status: He is alert and oriented to person, place, and time.  ?   Motor: No abnormal muscle tone.  ?   Coordination: Coordination normal.  ?Psychiatric:     ?   Behavior: Behavior normal.  ? ? ?ED Results / Procedures / Treatments   ?Labs ?(all labs ordered are listed, but only abnormal results are displayed) ?Labs Reviewed - No data to display ? ?EKG ?None ? ?Radiology ?DG Ribs Unilateral W/Chest Right ? ?Result Date: 10/30/2021 ?CLINICAL DATA:  Pain EXAM: RIGHT RIBS AND CHEST - 3+ VIEW COMPARISON:  Radiograph 08/10/2020 FINDINGS: Unchanged cardiomediastinal silhouette. There is no focal airspace consolidation. There is no large pleural effusion. There is no visible pneumothorax. There is no acute osseous abnormality. Specifically, there is no evidence of displaced rib fracture. IMPRESSION: No evidence of displaced rib fracture. No acute cardiopulmonary disease. Electronically Signed   By: Maurine Simmering M.D.   On: 10/30/2021 08:12   ? ?Procedures ?Procedures  ? ? ?Medications Ordered in ED ?Medications - No data to display ? ?ED Course/ Medical Decision Making/ A&P ?  ?                        ?Medical Decision Making ?Amount and/or Complexity of Data Reviewed ?Radiology: ordered. ? ?Risk ?Prescription drug management. ? ?This patient presents to the ED for concern of chest pain, this involves an extensive number of treatment options, and is a complaint that carries with it a high risk of complications and morbidity.  The differential  diagnosis includes MI, musculoskeletal pain ? ? ?Co morbidities that complicate the patient evaluation ? ?Diabetes ? ? ?Additional history obtained: ? ?Additional history obtained from patient ?External records from outside source obtained and reviewed including hospital records ? ? ?Lab Tests: ? ?No labs ? ?Imaging Studies ordered: ? ?I ordered imaging studies including chest x-ray ?I independently visualized and interpreted imaging which showed negative ?I agree with the radiologist interpretation ? ? ?Cardiac Monitoring: ? ?The patient was maintained on a cardiac monitor.  I personally viewed and interpreted the cardiac monitored which showed an underlying rhythm of: Normal sinus rhythm ? ? ?  Medicines ordered and prescription drug management: ? ?I ordered medication including no medicines ordered ?Reevaluation of the patient after these medicines showed that the patient stayed the same ?I have reviewed the patients home medicines and have made adjustments as needed ? ? ?Test Considered: ? ?CT chest ? ? ?Critical Interventions: ? ?None ? ? ?Consultations Obtained: ?No consult ?Problem List / ED Course: ? ?Chest wall pain ? ? ? ?Social Determinants of Health: ? ?None ? ? ? ? ? ? ?X-rays unremarkable.  This is chest wall pain.  We will place him on Motrin and Percocet if necessary and he will follow-up with PCP ? ? ? ? ? ? ? ?Final Clinical Impression(s) / ED Diagnoses ?Final diagnoses:  ?Chest wall pain  ? ? ?Rx / DC Orders ?ED Discharge Orders   ? ?      Ordered  ?  ibuprofen (ADVIL) 800 MG tablet  Every 8 hours PRN       ? 10/30/21 0937  ?  oxyCODONE-acetaminophen (PERCOCET/ROXICET) 5-325 MG tablet       ? 10/30/21 0937  ? ?  ?  ? ?  ? ? ?  ?Milton Ferguson, MD ?10/31/21 1704 ? ?

## 2021-10-30 NOTE — Discharge Instructions (Signed)
Do no heavy lifting for a week.  Follow-up with your family doctor if not getting better ?

## 2021-10-30 NOTE — ED Triage Notes (Signed)
Reports was working on a car yesterday and felt something pop in right ribs and had pain since  ?

## 2021-11-14 ENCOUNTER — Ambulatory Visit: Payer: 59 | Admitting: Internal Medicine

## 2022-01-02 ENCOUNTER — Other Ambulatory Visit: Payer: Self-pay | Admitting: *Deleted

## 2022-01-02 ENCOUNTER — Telehealth: Payer: Self-pay

## 2022-01-02 MED ORDER — NOVOLIN 70/30 FLEXPEN (70-30) 100 UNIT/ML ~~LOC~~ SUPN: PEN_INJECTOR | SUBCUTANEOUS | 0 refills | Status: DC

## 2022-01-02 NOTE — Telephone Encounter (Signed)
Patient called need refill ?insulin isophane & regular human KwikPen (NOVOLIN 70/30 KWIKPEN) (70-30) 100 U  ? NIT/ML KwikPen  ? ?Pharmacy: [facility] ?

## 2022-01-02 NOTE — Telephone Encounter (Signed)
Medication sent to pharmacy  

## 2022-01-13 ENCOUNTER — Other Ambulatory Visit: Payer: Self-pay | Admitting: Family Medicine

## 2022-01-13 DIAGNOSIS — K219 Gastro-esophageal reflux disease without esophagitis: Secondary | ICD-10-CM

## 2022-03-11 IMAGING — DX DG CHEST 1V PORT
1 series · 1 of 1 positions shown · non-contrast
Comparison: None.

CLINICAL DATA: 49-year-old male with preop chest radiograph.

EXAM:
PORTABLE CHEST 1 VIEW

[chest ap]
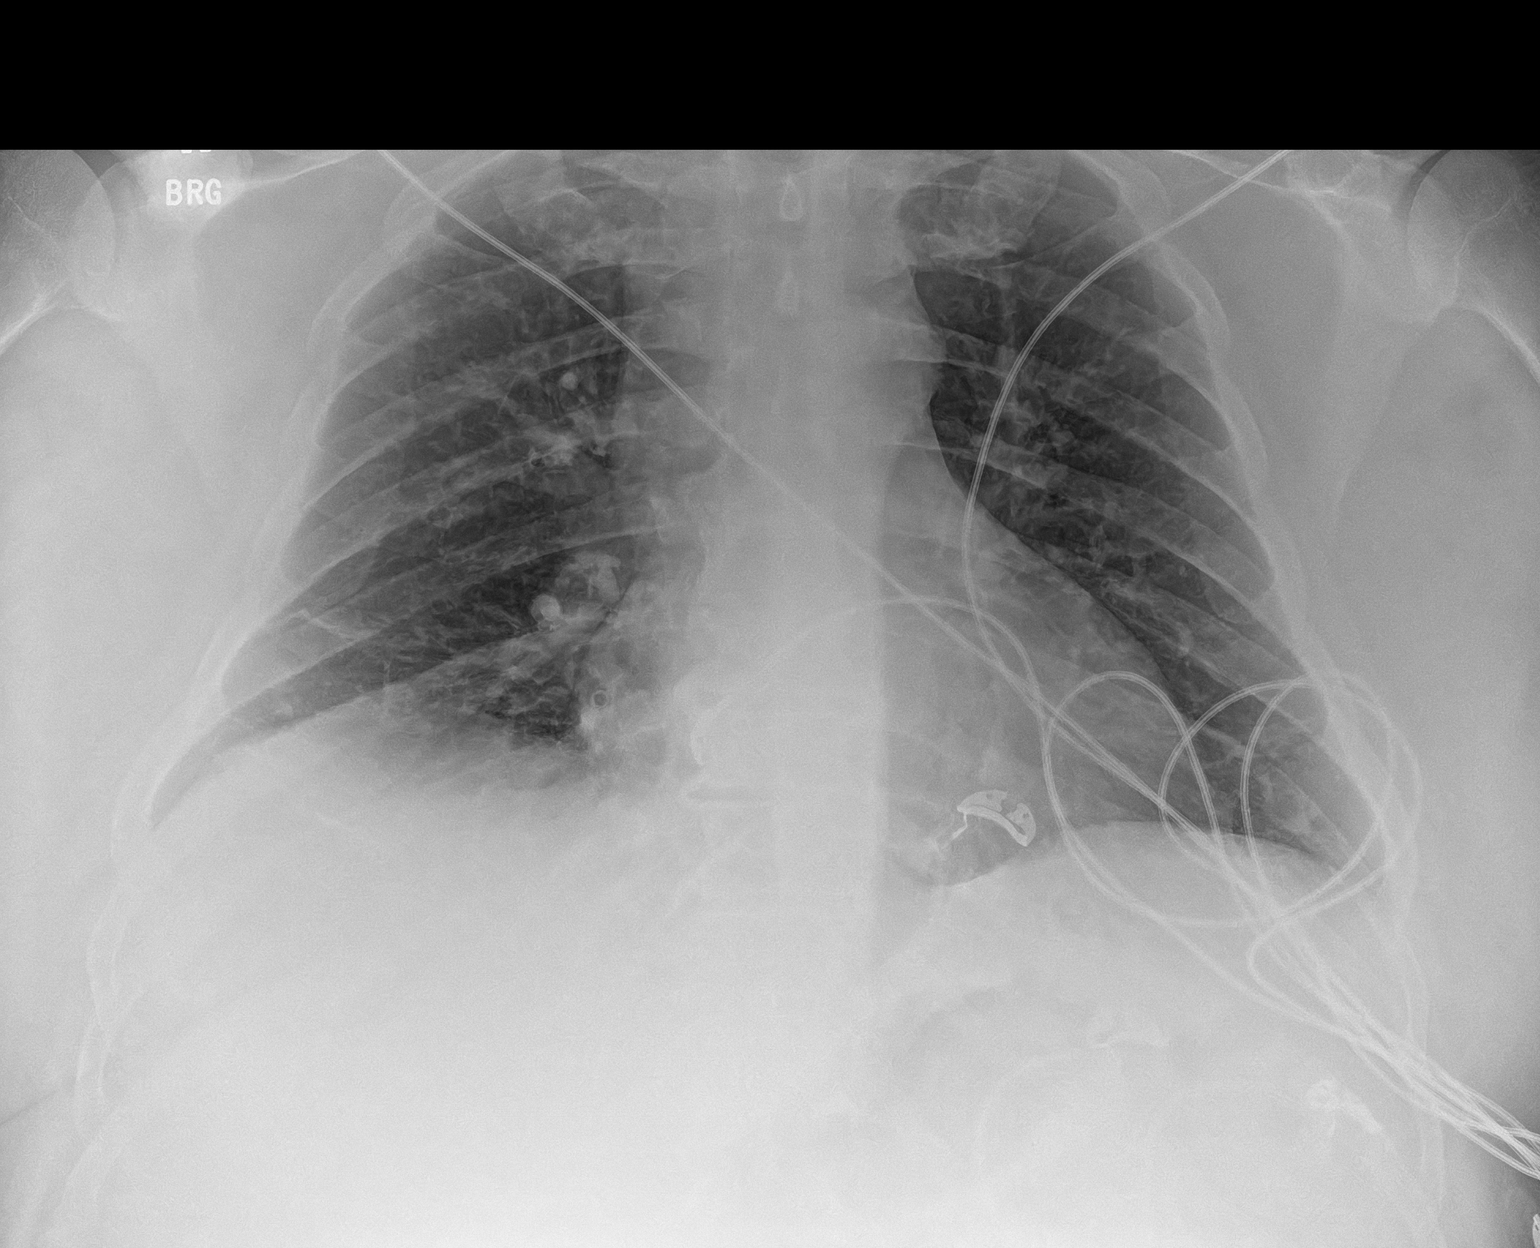

[1 of 1 positions shown; findings below may reference images not displayed]

FINDINGS: Minimal image tray shin of the right hemidiaphragm with minimal
right lung base atelectasis. No focal consolidation, pleural
effusion or pneumothorax. Top-normal cardiac size. No acute osseous
pathology. Degenerative changes of the spine.
IMPRESSION: No active disease.

## 2022-03-11 IMAGING — US US ABDOMEN LIMITED
1 series · 13 of 25 positions shown · non-contrast
Comparison: 08/07/2020.
COMPARISON: 08/07/2020.

Addendum:
CLINICAL DATA: Upper abdominal pain.

EXAM:
ULTRASOUND ABDOMEN LIMITED RIGHT UPPER QUADRANT

[Series 1: us abdomen limited · 13 of 44 slices shown]
[im 1/44]
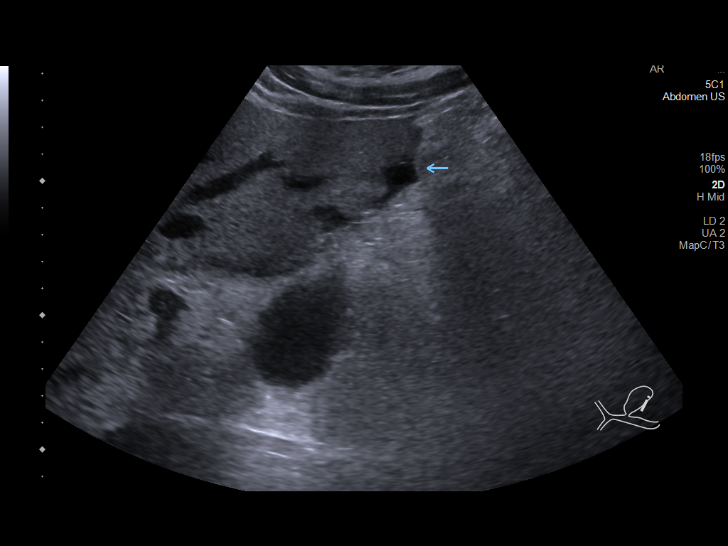
[im 4/44]
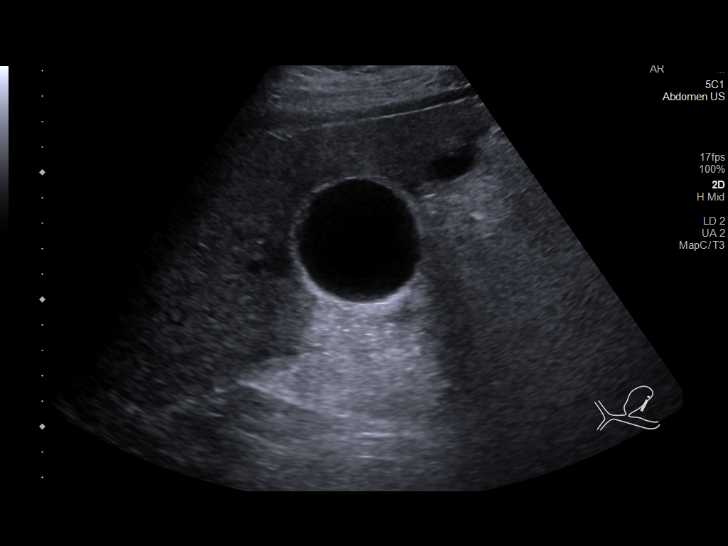
[im 8/44]
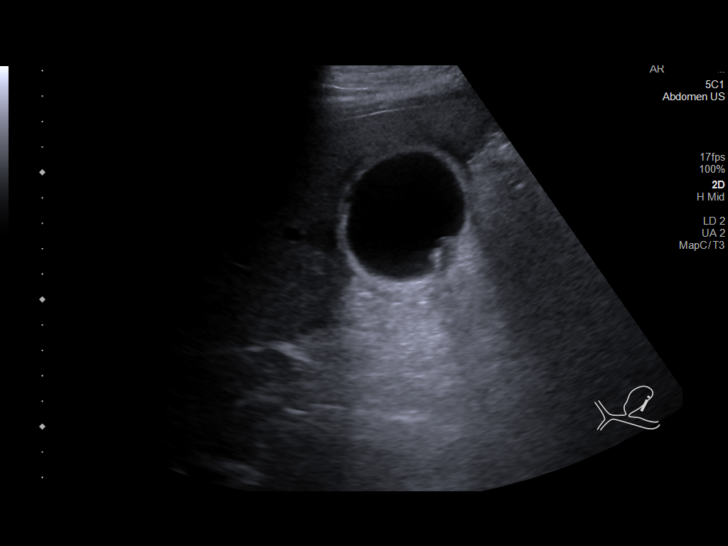
[im 11/44]
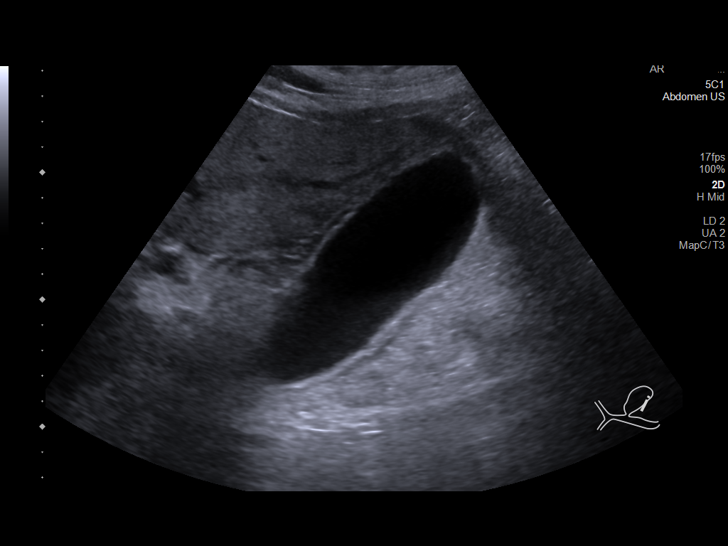
[im 15/44]
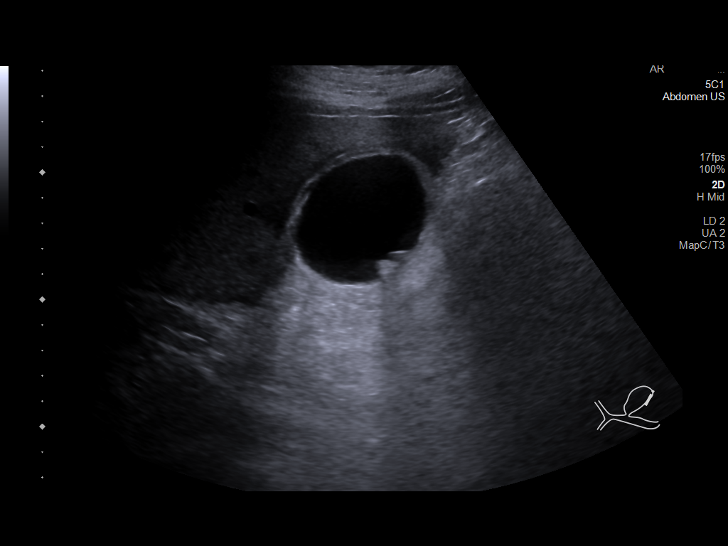
[im 18/44]
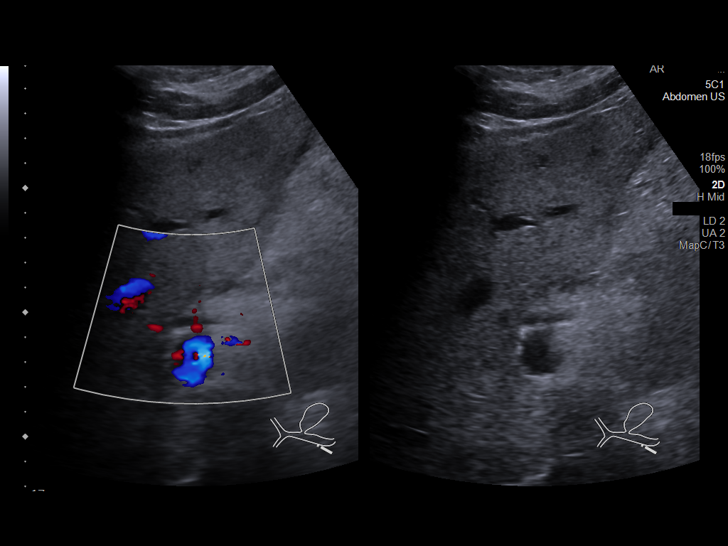
[im 22/44]
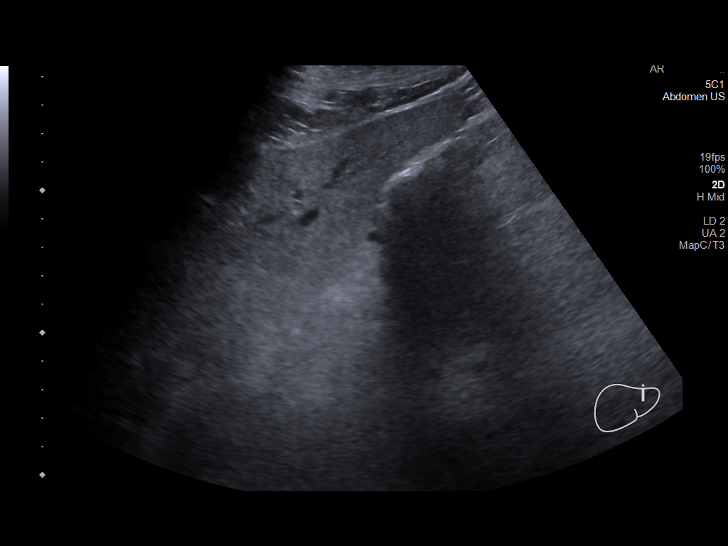
[im 26/44]
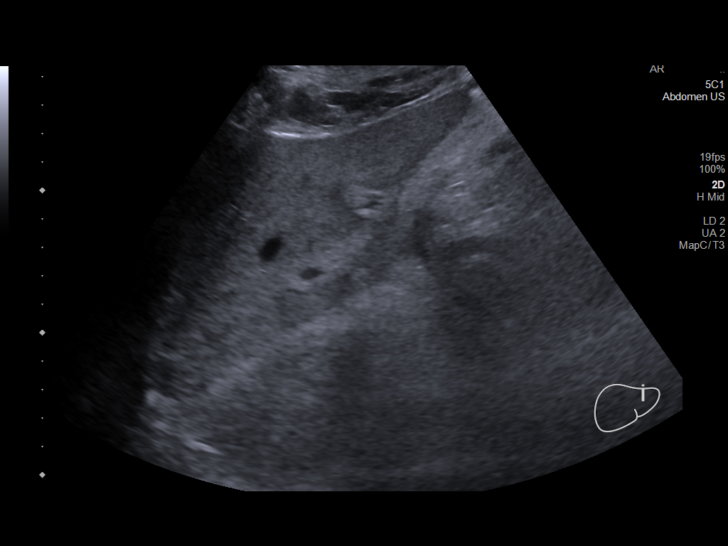
[im 29/44]
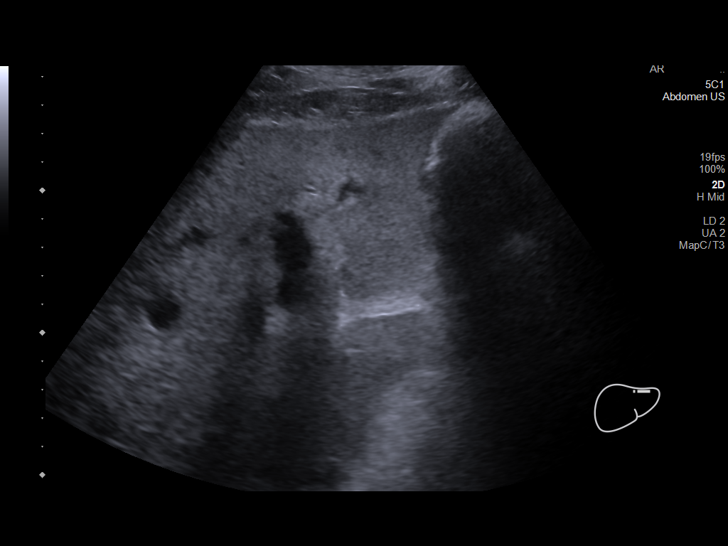
[im 33/44]
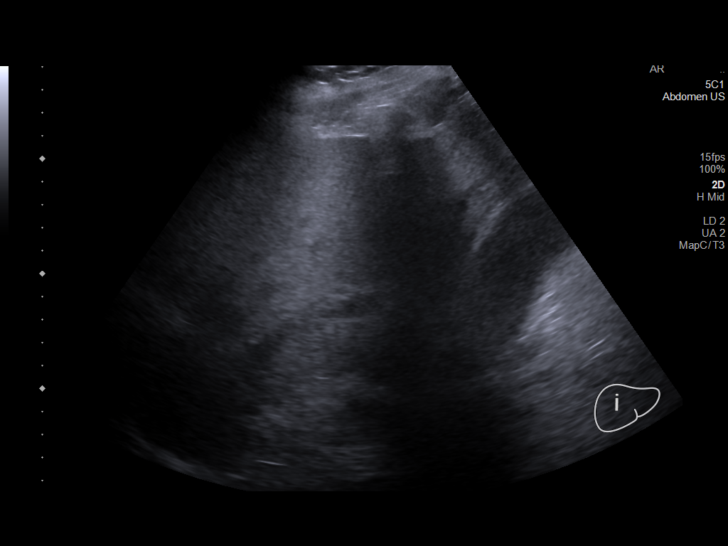
[im 36/44]
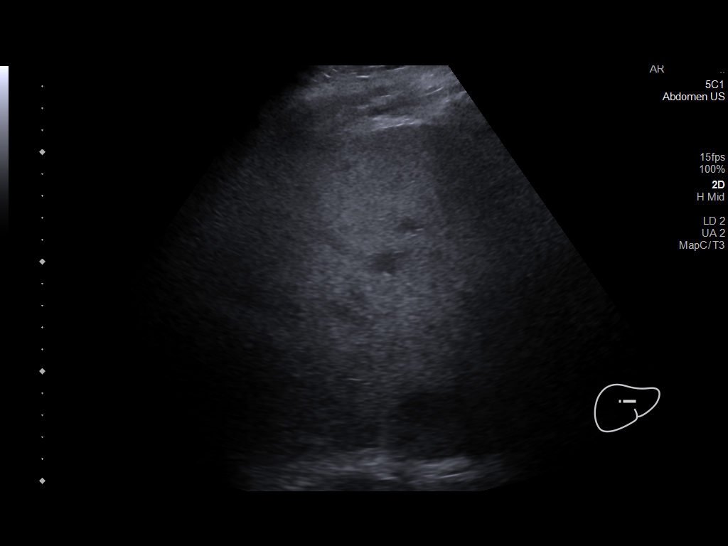
[im 40/44]
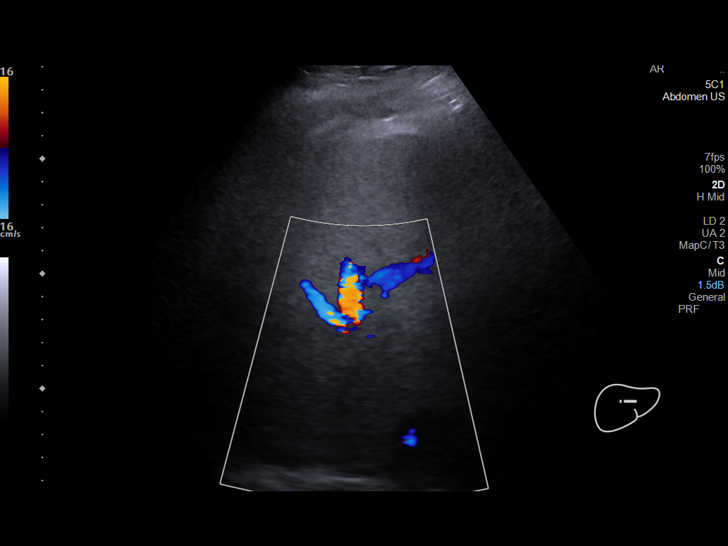
[im 44/44]
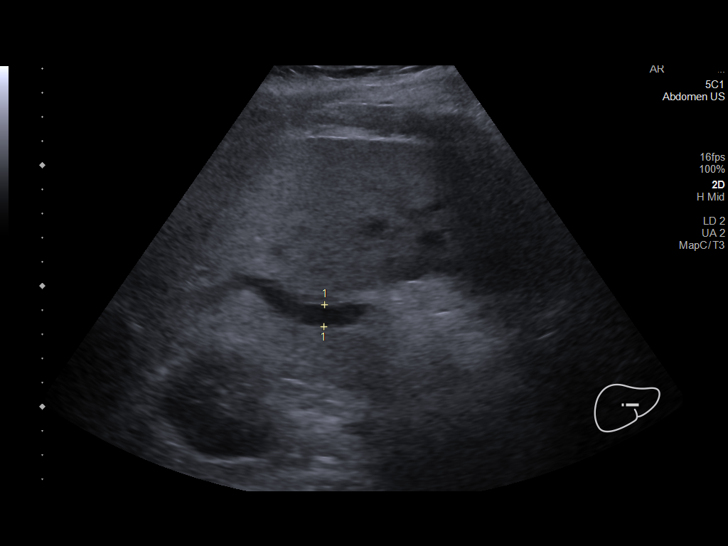

[13 of 25 positions shown; findings below may reference images not displayed]

FINDINGS: Gallbladder:

Mild gallbladder wall thickening. Intraluminal calculi measuring up
to 1 cm. Sonographic Murphy sign was noted by sonographer. Trace
pericholecystic free fluid.

Common bile duct:

Diameter: 5 mm

Liver:

No focal lesion identified. Increased parenchymal echogenicity.
Portal vein is patent on color Doppler imaging with normal direction
of blood flow towards the liver.

Other: None.
IMPRESSION: Cholelithiasis with mild gallbladder wall thickening, trace
pericholecystic free fluid and positive sonographic Murphy sign.
Collectively these findings are concerning for acute cholecystitis.

Hepatic steatosis.  No focal hepatic lesion.

ADDENDUM:
These results were called by telephone at the time of interpretation
on 08/10/2020 at [DATE] to provider HOUSEM SONDOS , who verbally
acknowledged these results.

*** End of Addendum ***
FINDINGS: Gallbladder:

Mild gallbladder wall thickening. Intraluminal calculi measuring up
to 1 cm. Sonographic Murphy sign was noted by sonographer. Trace
pericholecystic free fluid.

Common bile duct:

Diameter: 5 mm

Liver:

No focal lesion identified. Increased parenchymal echogenicity.
Portal vein is patent on color Doppler imaging with normal direction
of blood flow towards the liver.

Other: None.
IMPRESSION: Cholelithiasis with mild gallbladder wall thickening, trace
pericholecystic free fluid and positive sonographic Murphy sign.
Collectively these findings are concerning for acute cholecystitis.

Hepatic steatosis.  No focal hepatic lesion.

## 2022-03-19 ENCOUNTER — Ambulatory Visit
Admission: EM | Admit: 2022-03-19 | Discharge: 2022-03-19 | Disposition: A | Discharge status: Home / Self Care | Payer: 59 | Attending: Family Medicine | Admitting: Family Medicine

## 2022-03-19 DIAGNOSIS — L02419 Cutaneous abscess of limb, unspecified: Secondary | ICD-10-CM

## 2022-03-19 MED ORDER — DOXYCYCLINE HYCLATE 100 MG PO CAPS: 100.0000 mg | ORAL_CAPSULE | Freq: Two times a day (BID) | ORAL | 0 refills | Status: DC

## 2022-03-19 MED ORDER — CHLORHEXIDINE GLUCONATE 4 % EX LIQD
Freq: Every day | CUTANEOUS | 0 refills | Status: DC | PRN
Start: 2022-03-19 — End: 2022-07-10

## 2022-03-19 MED ORDER — MUPIROCIN 2 % EX OINT: 1.0000 | TOPICAL_OINTMENT | Freq: Two times a day (BID) | CUTANEOUS | 0 refills | Status: DC

## 2022-03-19 NOTE — ED Provider Notes (Signed)
[facility]    CSN: [id number] Arrival date & time: 03/19/22  1316      History   Chief Complaint Chief Complaint  Patient presents with   Abscess    HPI Gary Thompson is a 51 y.o. male.   Pt presents with abscess on right knee for past couple weeks that is now red swollen and oozing       Past Medical History:  Diagnosis Date   Anxiety    Diabetes mellitus without complication ([place])    Sleep apnea    Vertigo     Patient Active Problem List   Diagnosis Date Noted   Elevated BP without diagnosis of hypertension 08/14/2021   Encounter for general adult medical examination with abnormal findings 05/15/2021   OSA (obstructive sleep apnea) 05/15/2021   Panic disorder 08/31/2020   Type 2 diabetes mellitus with hyperglycemia ([place]) 08/31/2020   Hemorrhoids 08/31/2020    Past Surgical History:  Procedure Laterality Date   CHOLECYSTECTOMY N/A 08/11/2020   Procedure: LAPAROSCOPIC CHOLECYSTECTOMY;  Surgeon: Virl Cagey, MD;  Location: [facility];  Service: General;  Laterality: N/A;   GALLBLADDER SURGERY     KNEE SURGERY     UMBILICAL HERNIA REPAIR N/A 08/11/2020   Procedure: HERNIA REPAIR UMBILICAL ADULT;  Surgeon: Virl Cagey, MD;  Location: [facility];  Service: General;  Laterality: N/A;       Home Medications    Prior to Admission medications   Medication Sig Start Date End Date Taking? Authorizing Provider  chlorhexidine (HIBICLENS) 4 % external liquid Apply topically daily as needed. 03/19/22  Yes Volney American, PA-C  doxycycline (VIBRAMYCIN) 100 MG capsule Take 1 capsule (100 mg total) by mouth 2 (two) times daily. 03/19/22  Yes Volney American, PA-C  mupirocin ointment (BACTROBAN) 2 % Apply 1 Application topically 2 (two) times daily. 03/19/22  Yes Volney American, PA-C  aspirin-sod bicarb-citric acid (ALKA-SELTZER) 325 MG TBEF tablet Take 325 mg by mouth every 6 (six) hours as needed.    [provider]   clonazePAM (KLONOPIN) 1 MG tablet Take 1 tablet by mouth daily as needed. 07/29/20   [provider]  Dulaglutide (TRULICITY) 7.82 NF/6.2ZH SOPN Inject 0.75 mg into the skin once a week. 08/15/21   Noreene Larsson, NP  Dulaglutide (TRULICITY) 1.5 YQ/6.5HQ SOPN Inject 1.5 mg into the skin once a week. 09/12/21   Noreene Larsson, NP  glipiZIDE (GLUCOTROL) 10 MG tablet Take 1 tablet (10 mg total) by mouth 2 (two) times daily. 08/15/21   Noreene Larsson, NP  guaiFENesin-codeine (ROBITUSSIN AC) 100-10 MG/5ML syrup Take 5 mLs by mouth 3 (three) times daily as needed for cough. 09/04/21   Lindell Spar, MD  ibuprofen (ADVIL) 800 MG tablet Take 1 tablet (800 mg total) by mouth every 8 (eight) hours as needed for moderate pain. 10/30/21   Milton Ferguson, MD  insulin isophane & regular human KwikPen (NOVOLIN 70/30 KWIKPEN) (70-30) 100 UNIT/ML KwikPen Inject 10 units under the skin every morning and 5 units every evening 01/02/22   Lindell Spar, MD  Insulin Pen Needle 32G X 4 MM MISC 1 each by Does not apply route daily. 08/15/21   Noreene Larsson, NP  olmesartan (BENICAR) 20 MG tablet Take 1 tablet (20 mg total) by mouth daily. 08/14/21   Noreene Larsson, NP  omeprazole (PRILOSEC) 40 MG capsule Take 1 capsule by mouth once daily 01/15/22   Lindell Spar, MD  oxyCODONE-acetaminophen (  PERCOCET/ROXICET) 5-325 MG tablet Take 1 every 6 hours if the Motrin 800 mg does not help with the pain 10/30/21   Milton Ferguson, MD    Family History Family History  Problem Relation Age of Onset   Cancer Mother    Hypertension Mother    Diabetes Mother    Cancer Father    Hypertension Father    Diabetes Father    Cancer Paternal Grandmother    COPD Paternal Grandfather     Social History Social History   Tobacco Use   Smoking status: Never   Smokeless tobacco: Never  Vaping Use   Vaping Use: Never used  Substance Use Topics   Alcohol use: Yes    Comment: occ; usually less than 1 per week   Drug use: Not  Currently     Allergies   Dm-doxylamine-acetaminophen, Dextromethorphan, and Vicks dayquil-nyquil cld & flu [pe-dm-apap & doxylamin-dm-apap]   Review of Systems Review of Systems PER HPI  Physical Exam Triage Vital Signs ED Triage Vitals [03/19/22 1401]  Enc Vitals Group     BP (!) 149/90     Pulse Rate 83     Resp 20     Temp 98.7 F (37.1 C)     Temp src      SpO2 94 %     Weight      Height      Head Circumference      Peak Flow      Pain Score 6     Pain Loc      Pain Edu?      Excl. in [place]?    No data found.  Updated Vital Signs BP (!) 149/90   Pulse 83   Temp 98.7 F (37.1 C)   Resp 20   SpO2 94%   Visual Acuity Right Eye Distance:   Left Eye Distance:   Bilateral Distance:    Right Eye Near:   Left Eye Near:    Bilateral Near:     Physical Exam Vitals and nursing note reviewed.  Constitutional:      Appearance: Normal appearance.  HENT:     Head: Atraumatic.  Eyes:     Extraocular Movements: Extraocular movements intact.     Conjunctiva/sclera: Conjunctivae normal.  Cardiovascular:     Rate and Rhythm: Normal rate and regular rhythm.  Pulmonary:     Effort: Pulmonary effort is normal.     Breath sounds: Normal breath sounds.  Musculoskeletal:        General: Normal range of motion.     Cervical back: Normal range of motion and neck supple.  Skin:    General: Skin is warm.     Comments: 2 1.5 cm circular lesions to anterior right leg just below knee, both draining  Neurological:     General: No focal deficit present.     Mental Status: He is oriented to person, place, and time.     Comments: B/l LEs neurovascularly intact  Psychiatric:        Mood and Affect: Mood normal.        Thought Content: Thought content normal.        Judgment: Judgment normal.    UC Treatments / Results  Labs (all labs ordered are listed, but only abnormal results are displayed) Labs Reviewed - No data to display  EKG   Radiology No results  found.  Procedures Procedures (including critical care time)  Medications Ordered in UC Medications - No data  to display  Initial Impression / Assessment and Plan / UC Course  I have reviewed the triage vital signs and the nursing notes.  Pertinent labs & imaging results that were available during my care of the patient were reviewed by me and considered in my medical decision making (see chart for details).     Treat with doxycycline, good home wound care with Hibiclens, Bactroban.  Dressing applied today.  Follow-up with PCP for recheck.  Final Clinical Impressions(s) / UC Diagnoses   Final diagnoses:  Leg abscess   Discharge Instructions   None    ED Prescriptions     Medication Sig Dispense Auth. Provider   doxycycline (VIBRAMYCIN) 100 MG capsule Take 1 capsule (100 mg total) by mouth 2 (two) times daily. 20 capsule Volney American, [place]   chlorhexidine (HIBICLENS) 4 % external liquid Apply topically daily as needed. 120 mL Volney American, PA-C   mupirocin ointment (BACTROBAN) 2 % Apply 1 Application topically 2 (two) times daily. 22 g Volney American, [place]      PDMP not reviewed this encounter.   Volney American, [place] 03/19/22 1510

## 2022-03-19 NOTE — ED Triage Notes (Signed)
Pt presents with abscess on right knee for past couple weeks that is now red swollen and oozing

## 2022-04-03 ENCOUNTER — Other Ambulatory Visit: Payer: Self-pay | Admitting: Internal Medicine

## 2022-04-10 ENCOUNTER — Other Ambulatory Visit: Payer: Self-pay | Admitting: Internal Medicine

## 2022-05-07 ENCOUNTER — Other Ambulatory Visit: Payer: Self-pay | Admitting: Internal Medicine

## 2022-05-07 DIAGNOSIS — K219 Gastro-esophageal reflux disease without esophagitis: Secondary | ICD-10-CM

## 2022-06-22 LAB — HEMOGLOBIN A1C: Hemoglobin A1C: 10.4

## 2022-07-04 ENCOUNTER — Other Ambulatory Visit: Payer: Self-pay | Admitting: Internal Medicine

## 2022-07-04 ENCOUNTER — Encounter: Payer: Self-pay | Admitting: Internal Medicine

## 2022-07-10 ENCOUNTER — Encounter: Payer: Self-pay | Admitting: Internal Medicine

## 2022-07-10 ENCOUNTER — Ambulatory Visit (INDEPENDENT_AMBULATORY_CARE_PROVIDER_SITE_OTHER): Payer: 59 | Admitting: Internal Medicine

## 2022-07-10 DIAGNOSIS — I152 Hypertension secondary to endocrine disorders: Secondary | ICD-10-CM | POA: Diagnosis not present

## 2022-07-10 DIAGNOSIS — E1159 Type 2 diabetes mellitus with other circulatory complications: Secondary | ICD-10-CM | POA: Diagnosis not present

## 2022-07-10 DIAGNOSIS — E1165 Type 2 diabetes mellitus with hyperglycemia: Secondary | ICD-10-CM | POA: Diagnosis not present

## 2022-07-10 DIAGNOSIS — Z794 Long term (current) use of insulin: Secondary | ICD-10-CM

## 2022-07-10 MED ORDER — METFORMIN HCL ER 500 MG PO TB24: ORAL_TABLET | ORAL | 1 refills | Status: DC

## 2022-07-10 NOTE — Patient Instructions (Signed)
Thank you for trusting me with your care. To recap, today we discussed the following:  Type 2 diabetes mellitus with hyperglycemia, with long-term current use of insulin ([place]) - Start this medications as we discussed. Follow up in 2 weeks with readings.  metFORMIN (GLUCOPHAGE-XR) 500 MG 24 hr tablet; Take 500 mg by mouth once daily x1 week, then increase to 500 mg twice daily x1 week, then 1,000 mg in the AM and 500 mg in the PM x1 week, and finally 1,000 mg twice daily. Take with food.  Dispense: 90 tablet; Refill: 1  Hypertension - Controlled at home. Bring 7 readings to next visit.

## 2022-07-10 NOTE — Progress Notes (Unsigned)
     CC: Diabetes (Wants to see about getting his insulin increased. Also having a lot of neuropathy)    HPI:[name] is a 51 y.o. male who presents for evaluation of type 2 diabetes mellitus and hypertension. For the details of today's visit, please refer to the assessment and plan.   Past Medical History:  Diagnosis Date   Anxiety    Diabetes mellitus without complication ([place])    Sleep apnea    Vertigo      Physical Exam: There were no vitals filed for this visit.   Physical Exam Constitutional:      General: He is not in acute distress.    Appearance: He is obese.  Cardiovascular:     Rate and Rhythm: Normal rate and regular rhythm.     Heart sounds: No murmur heard. Pulmonary:     Effort: Pulmonary effort is normal.     Breath sounds: Normal breath sounds.  Psychiatric:        Mood and Affect: Mood normal.        Behavior: Behavior normal.      Assessment & Plan:   Type 2 diabetes mellitus with hyperglycemia (HCC)  Hgb A1c 10.4 % at the last visit. Currently on Glipizide 10 mg BID and 70/30 Insulin 10 units am and 5 units in pm. He has been on metformin in the past, but had loose bowel movements. He can not remember which dose he had side effect from. He has not been checking his blood glucose regularly. We discussed CGM, but he does not want to start.   Assessment/Plan: Type 2 Diabetes Mellitus, uncontrolled, complications of peripheral neuropathy and erectile dysfunction. Staring Metformin extended release with ramp up. Patient given instructions to stop at dose he has no GI upset. He will follow up in 2 weeks and check blood glucose pre breakfast and pre dinner. Will discuss additional oral medication once patient is done with ramp of Metformin.     Hypertension associated with diabetes ([place]) Patient blood pressure today is elevated above goal of 130/80. He reports he is monitoring at home and SBP is 120-130's. He is taking Olmesartan '20mg'$  daily.    Assessment/Plan: Primary HTN, elevated reading today SBP 158 written down, appears recording did not save in computer. Patient is going to record at home and present readings at follow up in 2 weeks.  - Continue Olmesartan 20 mg daily    [name], MD

## 2022-07-11 ENCOUNTER — Encounter: Payer: Self-pay | Admitting: Internal Medicine

## 2022-07-11 ENCOUNTER — Other Ambulatory Visit: Payer: Self-pay

## 2022-07-11 DIAGNOSIS — E1159 Type 2 diabetes mellitus with other circulatory complications: Secondary | ICD-10-CM | POA: Insufficient documentation

## 2022-07-11 DIAGNOSIS — I10 Essential (primary) hypertension: Secondary | ICD-10-CM | POA: Insufficient documentation

## 2022-07-11 MED ORDER — NOVOLIN 70/30 FLEXPEN RELION (70-30) 100 UNIT/ML ~~LOC~~ SUPN: PEN_INJECTOR | SUBCUTANEOUS | 0 refills | Status: DC

## 2022-07-11 NOTE — Assessment & Plan Note (Addendum)
  Hgb A1c 10.4 % at the last visit. Currently on Glipizide 10 mg BID and 70/30 Insulin 10 units am and 5 units in pm. He has been on metformin in the past, but had loose bowel movements. He can not remember which dose he had side effect from. He has not been checking his blood glucose regularly. We discussed CGM, but he does not want to start.   Assessment/Plan: Type 2 Diabetes Mellitus, uncontrolled, complications of peripheral neuropathy and erectile dysfunction. Staring Metformin extended release with ramp up. Patient given instructions to stop at dose he has no GI upset. He will follow up in 2 weeks and check blood glucose pre breakfast and pre dinner. Will discuss additional oral medication once patient is done with ramp of Metformin.

## 2022-07-11 NOTE — Assessment & Plan Note (Signed)
Patient blood pressure today is elevated above goal of 130/80. He reports he is monitoring at home and SBP is 120-130's. He is taking Olmesartan '20mg'$  daily.   Assessment/Plan: Primary HTN, elevated reading today SBP 158 written down, appears recording did not save in computer. Patient is going to record at home and present readings at follow up in 2 weeks.  - Continue Olmesartan 20 mg daily

## 2022-07-13 ENCOUNTER — Telehealth: Payer: Self-pay | Admitting: Internal Medicine

## 2022-07-13 ENCOUNTER — Encounter: Payer: Self-pay | Admitting: Internal Medicine

## 2022-07-13 NOTE — Telephone Encounter (Signed)
Received patients MyChart message . He has started checking blood glucose 3 times a day and they are >250 and up to 400. He realizes this is why he is feeling bad. No confusion,nausea, or vomiting. He is taking 14 units in the morning and 14 units in the evening of Insulin 70/30, glipizide 10 mg BID, and started metformin with ramp up instructions ( 500 mg qd currently). He was drinking 2-3 soft drinks. Has cut back to 1 to 2 soft drinks.   Instructed to increase to 20 units of Insulin 70/30 BID.  If he starts to have worsening or new symptoms he will go to the ED. I expect he has been living with high blood sugar for sometime given A1c of 10.4 and can be managed outpatient. He will follow up on Monday with new readings.

## 2022-07-17 ENCOUNTER — Encounter: Payer: Self-pay | Admitting: Internal Medicine

## 2022-07-17 ENCOUNTER — Ambulatory Visit (INDEPENDENT_AMBULATORY_CARE_PROVIDER_SITE_OTHER): Payer: 59 | Admitting: Internal Medicine

## 2022-07-17 DIAGNOSIS — E1165 Type 2 diabetes mellitus with hyperglycemia: Secondary | ICD-10-CM | POA: Diagnosis not present

## 2022-07-17 DIAGNOSIS — Z794 Long term (current) use of insulin: Secondary | ICD-10-CM | POA: Diagnosis not present

## 2022-07-17 DIAGNOSIS — K0889 Other specified disorders of teeth and supporting structures: Secondary | ICD-10-CM | POA: Diagnosis not present

## 2022-07-17 DIAGNOSIS — K047 Periapical abscess without sinus: Secondary | ICD-10-CM | POA: Diagnosis not present

## 2022-07-17 MED ORDER — AMOXICILLIN-POT CLAVULANATE 875-125 MG PO TABS: 1.0000 | ORAL_TABLET | Freq: Two times a day (BID) | ORAL | 0 refills | Status: DC

## 2022-07-17 MED ORDER — NOVOLIN 70/30 FLEXPEN RELION (70-30) 100 UNIT/ML ~~LOC~~ SUPN: PEN_INJECTOR | SUBCUTANEOUS | 0 refills | Status: DC

## 2022-07-17 NOTE — Patient Instructions (Signed)
Thank you for trusting me with your care. To recap, today we discussed the following:   1. Infection of tooth - follow up with dentist next week. I sent antibiotic to pharmacy.   2. Type 2 Diabetes Mellitus - Send me reading and I will send mychart message with adjustments. Follow up schedule for next week.

## 2022-07-17 NOTE — Progress Notes (Signed)
     This is a telephone encounter between [name] and [name] on 07/17/2022 for tooth pain. The visit was conducted with the patient located at home and [name] at [facility]. The patient's identity was confirmed using their DOB and current address. The patient has consented to being evaluated through a telephone encounter and understands the associated risks (an examination cannot be done and the patient may need to come in for an appointment) / benefits (allows the patient to remain at home, decreasing exposure to coronavirus).    CC: Tooth pain  HPI:[name] is a 51 y.o. male who presents for evaluation of tooth ache and type 2 diabetes mellitus. For the details of today's visit, please refer to the assessment and plan.  Past Medical History:  Diagnosis Date   Anxiety    Diabetes mellitus without complication ([place])    Sleep apnea    Vertigo      Assessment & Plan:   Type 2 diabetes mellitus with hyperglycemia ([place]) Follow up for uncontrolled T2DM. Currently on Glipizide 10 mg BID , Metformin 500 mg daily (ramping up, starts 1000 mg tomorrow) and 70/30 Insulin 20 units am and 20 units in pm.  Average glucometer reading >250 over the past 7 days with a high of 448 and low of 246 . This is a telephone encounter and patient is having tooth pain. He can not afford to see dentist today, but will have money to go next week.   Assessment/Plan:  Chronic problem, uncontrolled. Patient will continue ramp up of Metformin and continue Glipizide. Increasing 70/30 Insulin am dose to 30 units. Continue 20 units PM. Follow up scheduled for Monday.     Tooth pain Patient presents as telephone visit with tooth pain in his lower right side. No abscess he can see. Chronic poor dentition and has planned to have remaining teeth removed.  He has tried to go to dentist for evaluation, but will not have co pay until next week.  Assessment/Plan: Tooth pain, suspect acute infection of  tooth. Limited evaluation by telephone. Patient at risk for worsening infection due to uncontrolled T2DM. Prescribed antibiotic and patient will see dentist next week. Recommended Tylenol 1000 mg for pain.  - amoxicillin-clavulanate (AUGMENTIN) 875-125 MG tablet; Take 1 tablet by mouth 2 (two) times daily.  Dispense: 14 tablet; Refill: 0     Time:   Today, I have spent 20 minutes reviewing the chart, including problem list, medications, and with the patient with telehealth technology discussing the above problems.  [name], MD

## 2022-07-17 NOTE — Assessment & Plan Note (Addendum)
Patient presents as telephone visit with tooth pain in his lower right side. No abscess he can see. Chronic poor dentition and has planned to have remaining teeth removed.  He has tried to go to dentist for evaluation, but will not have co pay until next week.  Assessment/Plan: Tooth pain, suspect acute infection of tooth. Limited evaluation by telephone. Patient at risk for worsening infection due to uncontrolled T2DM. Prescribed antibiotic and patient will see dentist next week. Recommended Tylenol 1000 mg for pain.  - amoxicillin-clavulanate (AUGMENTIN) 875-125 MG tablet; Take 1 tablet by mouth 2 (two) times daily.  Dispense: 14 tablet; Refill: 0

## 2022-07-17 NOTE — Assessment & Plan Note (Addendum)
Follow up for uncontrolled T2DM. Currently on Glipizide 10 mg BID , Metformin 500 mg daily (ramping up, starts 1000 mg tomorrow) and 70/30 Insulin 20 units am and 20 units in pm.  Average glucometer reading >250 over the past 7 days with a high of 448 and low of 246 . This is a telephone encounter and patient is having tooth pain. He can not afford to see dentist today, but will have money to go next week.   Assessment/Plan:  Chronic problem, uncontrolled. Patient will continue ramp up of Metformin and continue Glipizide. Increasing 70/30 Insulin am dose to 30 units. Continue 20 units PM. Follow up scheduled for Monday.

## 2022-07-23 ENCOUNTER — Ambulatory Visit (INDEPENDENT_AMBULATORY_CARE_PROVIDER_SITE_OTHER): Payer: 59 | Admitting: Internal Medicine

## 2022-07-23 ENCOUNTER — Encounter: Payer: Self-pay | Admitting: Internal Medicine

## 2022-07-23 VITALS — BP 144/74 | HR 75 | Resp 16 | Ht 66.0 in | Wt 302.0 lb

## 2022-07-23 DIAGNOSIS — Z1211 Encounter for screening for malignant neoplasm of colon: Secondary | ICD-10-CM

## 2022-07-23 DIAGNOSIS — Z794 Long term (current) use of insulin: Secondary | ICD-10-CM | POA: Diagnosis not present

## 2022-07-23 DIAGNOSIS — I152 Hypertension secondary to endocrine disorders: Secondary | ICD-10-CM

## 2022-07-23 DIAGNOSIS — E1159 Type 2 diabetes mellitus with other circulatory complications: Secondary | ICD-10-CM | POA: Diagnosis not present

## 2022-07-23 DIAGNOSIS — E1165 Type 2 diabetes mellitus with hyperglycemia: Secondary | ICD-10-CM | POA: Diagnosis not present

## 2022-07-23 MED ORDER — NOVOLIN 70/30 FLEXPEN RELION (70-30) 100 UNIT/ML ~~LOC~~ SUPN: PEN_INJECTOR | SUBCUTANEOUS | 0 refills | Status: DC

## 2022-07-23 MED ORDER — OLMESARTAN MEDOXOMIL 40 MG PO TABS: 40.0000 mg | ORAL_TABLET | Freq: Every day | ORAL | 1 refills | Status: DC

## 2022-07-23 NOTE — Assessment & Plan Note (Signed)
51 year old without family history of colon cancer and no prior colon cancer screening. Discussed colon cancer screening options. Ordered Cologuard.

## 2022-07-23 NOTE — Progress Notes (Signed)
     CC: Diabetes (Follow up )    HPI:Gary Thompson is a 51 y.o. male who presents for evaluation of diabetes. For the details of today's visit, please refer to the assessment and plan.   Past Medical History:  Diagnosis Date   Anxiety    Diabetes mellitus without complication ([place])    Sleep apnea    Vertigo      Physical Exam: Vitals:   07/23/22 0811  BP: (!) 146/78  Pulse: 75  Resp: 16  SpO2: 94%  Weight: (!) 302 lb (137 kg)  Height: '5\' 6"'$  (1.676 m)     Physical Exam Constitutional:      General: He is not in acute distress.    Appearance: He is not ill-appearing.  Cardiovascular:     Rate and Rhythm: Normal rate and regular rhythm.     Pulses:          Dorsalis pedis pulses are 1+ on the right side and 1+ on the left side.       Posterior tibial pulses are 1+ on the right side and 1+ on the left side.  Pulmonary:     Effort: Pulmonary effort is normal.     Breath sounds: Normal breath sounds.  Feet:     Right foot:     Protective Sensation:   10 sites sensed.     Skin integrity: Dry skin present.     Left foot:     Protective Sensation:   10 sites sensed.     Skin integrity: Dry skin present.  Psychiatric:        Mood and Affect: Mood is anxious.        Behavior: Behavior normal.        Thought Content: Thought content normal.     Comments: Anxious affect      Assessment & Plan:   Hypertension associated with diabetes ([place])  Patient's BP today is 146/78 with a goal of <130/80 with T2DM. The patient endorses adherence to olmesartan 20 mg and BP on home monitor above goal. No lightheadedness or dizziness on standing.  Assessment/Plan: Chronic problem, uncontrolled. Increase olmesartan to 40 mg daily. Check BMP at follow up visit in 4 weeks.     Type 2 diabetes mellitus with hyperglycemia (HCC) Average glucometer recordings >300 over the past 7 days with a high of 400 and low of 258 which was not symptomatic. He is tolerating Metformin 1000  mg and will continue ramp up. Also on Glipizide and Insulin 70/30 30 units in am and 20 units in PM. He has just picked up this medication. His budget is tight at this time.  Assessment/Plan: Chronic problem, uncontrolled.  -Continue ramp up of Metformin.  -Continue Glipizide 10 mg BID - Increase 70/30 Insulin to 40 units am , 20 units pm - Foot exam today - microalbumin/creatinine ratio today - follow up in 1 month   Colon cancer screening 51 year old without family history of colon cancer and no prior colon cancer screening. Discussed colon cancer screening options. Ordered Cologuard.      [name], MD

## 2022-07-23 NOTE — Assessment & Plan Note (Signed)
  Patient's BP today is 146/78 with a goal of <130/80 with T2DM. The patient endorses adherence to olmesartan 20 mg and BP on home monitor above goal. No lightheadedness or dizziness on standing.  Assessment/Plan: Chronic problem, uncontrolled. Increase olmesartan to 40 mg daily. Check BMP at follow up visit in 4 weeks.

## 2022-07-23 NOTE — Patient Instructions (Signed)
Thank you for trusting me with your care. To recap, today we discussed the following:   Elevated BP without diagnosis of hypertension - Goal BP <130/80 - olmesartan (BENICAR) 40 MG tablet; Take 1 tablet (40 mg total) by mouth daily.  Dispense: 90 tablet; Refill: 1  Type 2 diabetes mellitus with hyperglycemia, with long-term current use of insulin (HCC) - Microalbumin / creatinine urine ratio - insulin isophane & regular human KwikPen (NOVOLIN 70/30 KWIKPEN) (70-30) 100 UNIT/ML KwikPen; INJECT 40 UNITS SUBCUTANEOUSLY EVERY MORNING AND 20 UNITS EVERY EVENING  Dispense: 15 mL; Refill: 0  Colon cancer screening - Cologuard   Regular physical activity is one of the most important things you can do for your health. If you're ready to get the immediate benefits of better sleep, reduced anxiety, and lower blood pressure, here are ways to get started. Look for ways to reduce time sitting and increase time moving. For example, make it a tradition to walk before or after dinner. I recommend at least 150 minutes of moderate-intensity physical activity a week for adults. You might split that into 30 minutes, 5 days a week.

## 2022-07-23 NOTE — Assessment & Plan Note (Signed)
Average glucometer recordings >300 over the past 7 days with a high of 400 and low of 258 which was not symptomatic. He is tolerating Metformin 1000 mg and will continue ramp up. Also on Glipizide and Insulin 70/30 30 units in am and 20 units in PM. He has just picked up this medication. His budget is tight at this time.  Assessment/Plan: Chronic problem, uncontrolled.  -Continue ramp up of Metformin.  -Continue Glipizide 10 mg BID - Increase 70/30 Insulin to 40 units am , 20 units pm - Foot exam today - microalbumin/creatinine ratio today - follow up in 1 month

## 2022-07-25 LAB — MICROALBUMIN / CREATININE URINE RATIO
Creatinine, Urine: 122.7 mg/dL
Microalb/Creat Ratio: 20 mg/g creat (ref 0–29)
Microalbumin, Urine: 24.3 ug/mL

## 2022-08-12 LAB — COLOGUARD: COLOGUARD: POSITIVE — AB

## 2022-08-13 ENCOUNTER — Encounter: Payer: Self-pay | Admitting: Internal Medicine

## 2022-08-13 ENCOUNTER — Other Ambulatory Visit: Payer: Self-pay | Admitting: Internal Medicine

## 2022-08-13 DIAGNOSIS — R195 Other fecal abnormalities: Secondary | ICD-10-CM

## 2022-08-13 NOTE — Addendum Note (Signed)
Addended byIhor Dow on: 08/13/2022 05:58 PM   Modules accepted: Orders

## 2022-08-14 ENCOUNTER — Encounter: Payer: Self-pay | Admitting: Internal Medicine

## 2022-08-14 ENCOUNTER — Other Ambulatory Visit: Payer: Self-pay

## 2022-08-14 DIAGNOSIS — E1165 Type 2 diabetes mellitus with hyperglycemia: Secondary | ICD-10-CM

## 2022-08-14 MED ORDER — NOVOLIN 70/30 FLEXPEN RELION (70-30) 100 UNIT/ML ~~LOC~~ SUPN: PEN_INJECTOR | SUBCUTANEOUS | 0 refills | Status: DC

## 2022-08-16 ENCOUNTER — Other Ambulatory Visit: Payer: Self-pay | Admitting: Internal Medicine

## 2022-08-16 ENCOUNTER — Other Ambulatory Visit: Payer: Self-pay

## 2022-08-16 DIAGNOSIS — K219 Gastro-esophageal reflux disease without esophagitis: Secondary | ICD-10-CM

## 2022-08-16 DIAGNOSIS — E1165 Type 2 diabetes mellitus with hyperglycemia: Secondary | ICD-10-CM

## 2022-08-16 MED ORDER — OMEPRAZOLE 40 MG PO CPDR: 40.0000 mg | DELAYED_RELEASE_CAPSULE | Freq: Every day | ORAL | 0 refills | Status: DC

## 2022-08-16 MED ORDER — GLIPIZIDE 10 MG PO TABS: 10.0000 mg | ORAL_TABLET | Freq: Two times a day (BID) | ORAL | 3 refills | Status: DC

## 2022-08-21 ENCOUNTER — Ambulatory Visit: Payer: 59 | Admitting: Internal Medicine

## 2022-08-28 ENCOUNTER — Other Ambulatory Visit: Payer: Self-pay

## 2022-08-28 DIAGNOSIS — E1165 Type 2 diabetes mellitus with hyperglycemia: Secondary | ICD-10-CM

## 2022-08-28 MED ORDER — NOVOLIN 70/30 FLEXPEN RELION (70-30) 100 UNIT/ML ~~LOC~~ SUPN: PEN_INJECTOR | SUBCUTANEOUS | 0 refills | Status: DC

## 2022-08-31 ENCOUNTER — Encounter: Payer: Self-pay | Admitting: Internal Medicine

## 2022-08-31 ENCOUNTER — Ambulatory Visit (INDEPENDENT_AMBULATORY_CARE_PROVIDER_SITE_OTHER): Payer: 59 | Admitting: Internal Medicine

## 2022-08-31 VITALS — BP 148/74 | HR 87 | Ht 66.0 in | Wt 300.0 lb

## 2022-08-31 DIAGNOSIS — Z794 Long term (current) use of insulin: Secondary | ICD-10-CM | POA: Diagnosis not present

## 2022-08-31 DIAGNOSIS — R195 Other fecal abnormalities: Secondary | ICD-10-CM

## 2022-08-31 DIAGNOSIS — I152 Hypertension secondary to endocrine disorders: Secondary | ICD-10-CM

## 2022-08-31 DIAGNOSIS — E1159 Type 2 diabetes mellitus with other circulatory complications: Secondary | ICD-10-CM

## 2022-08-31 DIAGNOSIS — E1165 Type 2 diabetes mellitus with hyperglycemia: Secondary | ICD-10-CM

## 2022-08-31 MED ORDER — AMLODIPINE BESYLATE 5 MG PO TABS: 5.0000 mg | ORAL_TABLET | Freq: Every day | ORAL | 0 refills | Status: DC

## 2022-08-31 MED ORDER — TIRZEPATIDE 2.5 MG/0.5ML ~~LOC~~ SOAJ: 2.5000 mg | SUBCUTANEOUS | 0 refills | Status: DC

## 2022-08-31 NOTE — Assessment & Plan Note (Addendum)
BP Readings from Last 1 Encounters:  08/31/22 (!) 148/74   uncontrolled with olmesartan 40 mg daily Added amlodipine 5 mg daily Counseled for compliance with the medications Advised DASH diet and moderate exercise/walking, at least 150 mins/week

## 2022-08-31 NOTE — Progress Notes (Addendum)
Established Patient Office Visit  Subjective:  Patient ID: Gary Thompson, male    DOB: 1971/02/24  Age: 52 y.o. MRN: [id number]  CC:  Chief Complaint  Patient presents with   Diabetes    Patient is here for a four week follow up on his diabetic medication that he started    HPI Gary Thompson is a 52 y.o. male with past medical history of type II DM, HTN, HLD, panic disorder and OSA who presents for f/u of his chronic medical conditions.  Type II DM: He has been taking Novolin 70/30 -40 units every morning and 20 units every evening, metformin 1000 mg twice daily and glipizide 10 mg twice daily.  He has brought blood glucose log, which show mostly above 300 range, which is apparently better, compared to prior readings.  His last HbA1c was 10.3 in 10/23.  Denies any polyuria or polyphagia.  He is trying to improve his diet.  HTN: His BP was elevated today.  He has been taking olmesartan regularly.  Denies any headache, dizziness, chest pain, dyspnea or palpitations.    Past Medical History:  Diagnosis Date   Anxiety    Diabetes mellitus without complication ([place])    Sleep apnea    Vertigo     Past Surgical History:  Procedure Laterality Date   CHOLECYSTECTOMY N/A 08/11/2020   Procedure: LAPAROSCOPIC CHOLECYSTECTOMY;  Surgeon: Virl Cagey, MD;  Location: [facility];  Service: General;  Laterality: N/A;   GALLBLADDER SURGERY     KNEE SURGERY     UMBILICAL HERNIA REPAIR N/A 08/11/2020   Procedure: HERNIA REPAIR UMBILICAL ADULT;  Surgeon: Virl Cagey, MD;  Location: [facility];  Service: General;  Laterality: N/A;    Family History  Problem Relation Age of Onset   Cancer Mother    Hypertension Mother    Diabetes Mother    Cancer Father    Hypertension Father    Diabetes Father    Cancer Paternal Grandmother    COPD Paternal Grandfather     Social History   Socioeconomic History   Marital status: Divorced    Spouse name: Not on file   Number of children: 0    Years of education: Not on file   Highest education level: Not on file  Occupational History   Not on file  Tobacco Use   Smoking status: Never   Smokeless tobacco: Never  Vaping Use   Vaping Use: Never used  Substance and Sexual Activity   Alcohol use: Yes    Comment: occ; usually less than 1 per week   Drug use: Not Currently   Sexual activity: Yes  Other Topics Concern   Not on file  Social History Narrative   Not on file   Social Determinants of Health   Financial Resource Strain: Not on file  Food Insecurity: Not on file  Transportation Needs: Not on file  Physical Activity: Not on file  Stress: Not on file  Social Connections: Not on file  Intimate Partner Violence: Not on file    Outpatient Medications Prior to Visit  Medication Sig Dispense Refill   baclofen (LIORESAL) 10 MG tablet Take 10 mg by mouth 3 (three) times daily.     clonazePAM (KLONOPIN) 1 MG tablet Take 1 tablet by mouth daily as needed.     glipiZIDE (GLUCOTROL) 10 MG tablet Take 1 tablet (10 mg total) by mouth 2 (two) times daily. 180 tablet 3   insulin isophane & regular human KwikPen (  NOVOLIN 70/30 KWIKPEN) (70-30) 100 UNIT/ML KwikPen INJECT 40 UNITS SUBCUTANEOUSLY EVERY MORNING AND 20 UNITS EVERY EVENING 15 mL 0   Insulin Pen Needle 32G X 4 MM MISC 1 each by Does not apply route daily. 90 each 3   metFORMIN (GLUCOPHAGE-XR) 500 MG 24 hr tablet Take 500 mg by mouth once daily x1 week, then increase to 500 mg twice daily x1 week, then 1,000 mg in the AM and 500 mg in the PM x1 week, and finally 1,000 mg twice daily. Take with food. 90 tablet 1   olmesartan (BENICAR) 40 MG tablet Take 1 tablet (40 mg total) by mouth daily. 90 tablet 1   omeprazole (PRILOSEC) 40 MG capsule Take 1 capsule (40 mg total) by mouth daily. 90 capsule 0   amoxicillin-clavulanate (AUGMENTIN) 875-125 MG tablet Take 1 tablet by mouth 2 (two) times daily. 14 tablet 0   No facility-administered medications prior to visit.     Allergies  Allergen Reactions   Dm-Doxylamine-Acetaminophen Hives and Shortness Of Breath    "Nyquill" and "Vicks 44-D" Hives States difficulty breathing    Dextromethorphan    Vicks Dayquil-Nyquil Cld & Flu [Pe-Dm-Apap & Doxylamin-Dm-Apap] Other (See Comments)    "overly sedated feeling'    ROS Review of Systems  Constitutional:  Negative for chills and fever.  HENT:  Negative for congestion and sore throat.   Eyes:  Negative for pain and discharge.  Respiratory:  Negative for cough and shortness of breath.   Cardiovascular:  Negative for chest pain and palpitations.  Gastrointestinal:  Negative for constipation, diarrhea, nausea and vomiting.  Endocrine: Negative for polydipsia and polyuria.  Genitourinary:  Negative for dysuria and hematuria.  Musculoskeletal:  Negative for neck pain and neck stiffness.  Skin:  Negative for rash.  Neurological:  Negative for dizziness, weakness, numbness and headaches.  Psychiatric/Behavioral:  Negative for agitation and behavioral problems.       Objective:    Physical Exam Vitals reviewed.  Constitutional:      General: He is not in acute distress.    Appearance: He is obese. He is not diaphoretic.  HENT:     Head: Normocephalic and atraumatic.     Nose: Nose normal.     Mouth/Throat:     Mouth: Mucous membranes are moist.  Eyes:     General: No scleral icterus.    Extraocular Movements: Extraocular movements intact.  Cardiovascular:     Rate and Rhythm: Normal rate and regular rhythm.     Heart sounds: Normal heart sounds. No murmur heard. Pulmonary:     Breath sounds: Normal breath sounds. No wheezing or rales.  Musculoskeletal:     Cervical back: Neck supple. No tenderness.     Right lower leg: No edema.     Left lower leg: No edema.  Skin:    General: Skin is warm.     Findings: No rash.  Neurological:     General: No focal deficit present.     Mental Status: He is alert and oriented to person, place, and time.      Sensory: No sensory deficit.     Motor: No weakness.  Psychiatric:        Mood and Affect: Mood normal.        Behavior: Behavior normal.     BP (!) 148/74 (BP Location: Left Arm)   Pulse 87   Ht '5\' 6"'$  (1.676 m)   Wt 300 lb (136.1 kg)   SpO2 92%  BMI 48.42 kg/m  Wt Readings from Last 3 Encounters:  08/31/22 300 lb (136.1 kg)  07/23/22 (!) 302 lb (137 kg)  10/30/21 290 lb (131.5 kg)    No results found for: "TSH" Lab Results  Component Value Date   WBC 8.7 08/14/2021   HGB 15.3 08/14/2021   HCT 45.4 08/14/2021   MCV 83 08/14/2021   PLT 166 08/14/2021   Lab Results  Component Value Date   NA 135 08/14/2021   K 4.6 08/14/2021   CO2 26 08/14/2021   GLUCOSE 332 (H) 08/14/2021   BUN 16 08/14/2021   CREATININE 1.29 (H) 08/14/2021   BILITOT 0.4 08/14/2021   ALKPHOS 125 (H) 08/14/2021   AST 15 08/14/2021   ALT 27 08/14/2021   PROT 6.8 08/14/2021   ALBUMIN 4.0 08/14/2021   CALCIUM 9.2 08/14/2021   ANIONGAP 10 08/17/2020   EGFR 68 08/14/2021   Lab Results  Component Value Date   CHOL 178 08/14/2021   Lab Results  Component Value Date   HDL 30 (L) 08/14/2021   Lab Results  Component Value Date   LDLCALC 101 (H) 08/14/2021   Lab Results  Component Value Date   TRIG 273 (H) 08/14/2021   Lab Results  Component Value Date   CHOLHDL 4.9 11/30/2020   Lab Results  Component Value Date   HGBA1C 10.4 06/22/2022      Assessment & Plan:   Problem List Items Addressed This Visit       Cardiovascular and Mediastinum   Hypertension associated with diabetes ([place])    BP Readings from Last 1 Encounters:  08/31/22 (!) 148/74  uncontrolled with olmesartan 40 mg daily Added amlodipine 5 mg daily Counseled for compliance with the medications Advised DASH diet and moderate exercise/walking, at least 150 mins/week       Relevant Medications   tirzepatide (MOUNJARO) 2.5 MG/0.5ML Pen   amLODipine (NORVASC) 5 MG tablet     Endocrine   Type 2 diabetes  mellitus with hyperglycemia ([facility]) - Primary    Lab Results  Component Value Date   HGBA1C 10.4 06/22/2022   Uncontrolled On Novolin 70/30 -40 units every morning and 20 units nightly On Metformin 1000 mg BID and Glipizide 10 mg BID Added Mounjaro, increase dose as tolerated Advised to follow diabetic diet On ARB F/u CMP and lipid panel Diabetic eye exam: Advised to follow up with Ophthalmology for diabetic eye exam      Relevant Medications   tirzepatide (MOUNJARO) 2.5 MG/0.5ML Pen     Other   Positive colorectal cancer screening using Cologuard test    Referred to GI for colonoscopy      Relevant Orders   Ambulatory referral to Gastroenterology    Meds ordered this encounter  Medications   tirzepatide (MOUNJARO) 2.5 MG/0.5ML Pen    Sig: Inject 2.5 mg into the skin once a week.    Dispense:  2 mL    Refill:  0   amLODipine (NORVASC) 5 MG tablet    Sig: Take 1 tablet (5 mg total) by mouth daily.    Dispense:  30 tablet    Refill:  0    Follow-up: Return in about 4 weeks (around 09/28/2022).

## 2022-08-31 NOTE — Assessment & Plan Note (Addendum)
Lab Results  Component Value Date   HGBA1C 10.4 06/22/2022   Uncontrolled On Novolin 70/30 -40 units every morning and 20 units nightly On Metformin 1000 mg BID and Glipizide 10 mg BID Added Mounjaro, increase dose as tolerated Advised to follow diabetic diet On ARB F/u CMP and lipid panel Diabetic eye exam: Advised to follow up with Ophthalmology for diabetic eye exam

## 2022-08-31 NOTE — Patient Instructions (Signed)
Please start taking Mounjaro as prescribed.  Please continue taking other medications as prescribed.

## 2022-08-31 NOTE — Assessment & Plan Note (Signed)
Referred to GI for colonoscopy 

## 2022-09-13 ENCOUNTER — Encounter: Payer: Self-pay | Admitting: Gastroenterology

## 2022-09-18 ENCOUNTER — Ambulatory Visit (AMBULATORY_SURGERY_CENTER): Payer: 59

## 2022-09-18 VITALS — Ht 66.0 in | Wt 290.0 lb

## 2022-09-18 DIAGNOSIS — R195 Other fecal abnormalities: Secondary | ICD-10-CM

## 2022-09-18 MED ORDER — NA SULFATE-K SULFATE-MG SULF 17.5-3.13-1.6 GM/177ML PO SOLN: 1.0000 | Freq: Once | ORAL | 0 refills | Status: AC

## 2022-09-18 NOTE — Progress Notes (Signed)
No egg or soy allergy known to patient  No issues known to pt with past sedation with any surgeries or procedures Patient denies ever being told they had issues or difficulty with intubation  Pt with difficulty breathing when laying flat on his back patient tolerates being put on his left and ride side. Sleep apena no CPAP use.  No FH of Malignant Hyperthermia Pt is not on diet pills Pt is not on  home 02  Pt is not on blood thinners  Pt denies issues with constipation  No A fib or A flutter Have any cardiac testing pending--NO  Pt instructed to use Single[employer] or GoodRx for a price reduction on prep   Patient's chart reviewed by Osvaldo Angst CNRA prior to previsit and patient appropriate for the [facility].  Previsit completed and red dot placed by patient's name on their procedure day (on provider's schedule).

## 2022-09-28 ENCOUNTER — Encounter: Payer: Self-pay | Admitting: Internal Medicine

## 2022-09-28 ENCOUNTER — Ambulatory Visit: Payer: Medicaid Other | Admitting: Internal Medicine

## 2022-09-28 VITALS — BP 131/76 | HR 82 | Ht 66.0 in | Wt 298.6 lb

## 2022-09-28 DIAGNOSIS — E1165 Type 2 diabetes mellitus with hyperglycemia: Secondary | ICD-10-CM | POA: Diagnosis not present

## 2022-09-28 DIAGNOSIS — E782 Mixed hyperlipidemia: Secondary | ICD-10-CM

## 2022-09-28 DIAGNOSIS — I152 Hypertension secondary to endocrine disorders: Secondary | ICD-10-CM

## 2022-09-28 DIAGNOSIS — Z794 Long term (current) use of insulin: Secondary | ICD-10-CM | POA: Diagnosis not present

## 2022-09-28 DIAGNOSIS — E1159 Type 2 diabetes mellitus with other circulatory complications: Secondary | ICD-10-CM | POA: Diagnosis not present

## 2022-09-28 MED ORDER — OZEMPIC (0.25 OR 0.5 MG/DOSE) 2 MG/3ML ~~LOC~~ SOPN: PEN_INJECTOR | SUBCUTANEOUS | 1 refills | Status: DC

## 2022-09-28 MED ORDER — METFORMIN HCL ER 500 MG PO TB24: 1000.0000 mg | ORAL_TABLET | Freq: Two times a day (BID) | ORAL | 1 refills | Status: DC

## 2022-09-28 MED ORDER — AMLODIPINE BESYLATE 5 MG PO TABS
5.0000 mg | ORAL_TABLET | Freq: Every day | ORAL | 1 refills | Status: DC
Start: 2022-09-28 — End: 2022-12-27

## 2022-09-28 MED ORDER — LANTUS SOLOSTAR 100 UNIT/ML ~~LOC~~ SOPN: 20.0000 [IU] | PEN_INJECTOR | Freq: Every day | SUBCUTANEOUS | 2 refills | Status: DC

## 2022-09-28 NOTE — Progress Notes (Signed)
Established Patient Office Visit  Subjective:  Patient ID: Gary Thompson, male    DOB: [birth year]  Age: 52 y.o. MRN: [id number]  CC:  Chief Complaint  Patient presents with   Diabetes    Four week follow up for diabetes    HPI [name] is a 52 y.o. male with past medical history of type II DM, HTN, HLD, panic disorder and OSA who presents for f/u of his chronic medical conditions.  Type II DM: He had been taking Novolin 70/30 -40 units every morning and 20 units every evening, metformin 1000 mg twice daily and glipizide 10 mg twice daily.  He has run out of Novolin 70/30.  He could not get Mounjaro due to insurance coverage concern.  He has brought blood glucose log, which show mostly above 300 range, which is apparently better, compared to prior readings.  His last HbA1c was 10.3 in 10/23.  Denies any polyuria or polyphagia.  He is trying to improve his diet.  HTN: His BP was wnl today.  He has been taking olmesartan and amlodipine regularly.  Denies any headache, dizziness, chest pain, dyspnea or palpitations.   Past Medical History:  Diagnosis Date   Anxiety    Arthritis    Depression    Diabetes mellitus without complication ([place])    Hyperlipidemia    Hypertension    Sleep apnea    Vertigo     Past Surgical History:  Procedure Laterality Date   CHOLECYSTECTOMY N/A 08/11/2020   Procedure: LAPAROSCOPIC CHOLECYSTECTOMY;  Surgeon: Virl Cagey, MD;  Location: [facility];  Service: General;  Laterality: N/A;   GALLBLADDER SURGERY     KNEE SURGERY     UMBILICAL HERNIA REPAIR N/A 08/11/2020   Procedure: HERNIA REPAIR UMBILICAL ADULT;  Surgeon: Virl Cagey, MD;  Location: [facility];  Service: General;  Laterality: N/A;    Family History  Problem Relation Age of Onset   Cancer Mother    Hypertension Mother    Diabetes Mother    Colon polyps Father    Cancer Father    Hypertension Father    Diabetes Father    Colon cancer Paternal Grandmother        not  sure of age of onset or death   Cancer Paternal Grandmother    COPD Paternal Grandfather    Esophageal cancer Neg Hx    Rectal cancer Neg Hx    Stomach cancer Neg Hx     Social History   Socioeconomic History   Marital status: Divorced    Spouse name: Not on file   Number of children: 0   Years of education: Not on file   Highest education level: Not on file  Occupational History   Not on file  Tobacco Use   Smoking status: Never   Smokeless tobacco: Never  Vaping Use   Vaping Use: Never used  Substance and Sexual Activity   Alcohol use: Yes    Comment: occ; usually less than 1 per week   Drug use: Not Currently   Sexual activity: Yes  Other Topics Concern   Not on file  Social History Narrative   Not on file   Social Determinants of Health   Financial Resource Strain: Not on file  Food Insecurity: Not on file  Transportation Needs: Not on file  Physical Activity: Not on file  Stress: Not on file  Social Connections: Not on file  Intimate Partner Violence: Not on file    Outpatient  Medications Prior to Visit  Medication Sig Dispense Refill   clonazePAM (KLONOPIN) 1 MG tablet Take 1 tablet by mouth daily as needed.     glipiZIDE (GLUCOTROL) 10 MG tablet Take 1 tablet (10 mg total) by mouth 2 (two) times daily. 180 tablet 3   Insulin Pen Needle 32G X 4 MM MISC 1 each by Does not apply route daily. 90 each 3   olmesartan (BENICAR) 40 MG tablet Take 1 tablet (40 mg total) by mouth daily. 90 tablet 1   omeprazole (PRILOSEC) 40 MG capsule Take 1 capsule (40 mg total) by mouth daily. 90 capsule 0   amLODipine (NORVASC) 5 MG tablet Take 1 tablet (5 mg total) by mouth daily. 30 tablet 0   baclofen (LIORESAL) 10 MG tablet Take 10 mg by mouth 3 (three) times daily.     insulin isophane & regular human KwikPen (NOVOLIN 70/30 KWIKPEN) (70-30) 100 UNIT/ML KwikPen INJECT 40 UNITS SUBCUTANEOUSLY EVERY MORNING AND 20 UNITS EVERY EVENING (Patient not taking: Reported on 09/18/2022)  15 mL 0   metFORMIN (GLUCOPHAGE-XR) 500 MG 24 hr tablet Take 500 mg by mouth once daily x1 week, then increase to 500 mg twice daily x1 week, then 1,000 mg in the AM and 500 mg in the PM x1 week, and finally 1,000 mg twice daily. Take with food. 90 tablet 1   tirzepatide (MOUNJARO) 2.5 MG/0.5ML Pen Inject 2.5 mg into the skin once a week. (Patient not taking: Reported on 09/18/2022) 2 mL 0   No facility-administered medications prior to visit.    Allergies  Allergen Reactions   Dm-Doxylamine-Acetaminophen Hives and Shortness Of Breath    "Nyquill" and "Vicks 44-D" Hives States difficulty breathing    Dextromethorphan    Vicks Dayquil-Nyquil Cld & Flu [Pe-Dm-Apap & Doxylamin-Dm-Apap] Other (See Comments)    "overly sedated feeling'    ROS Review of Systems  Constitutional:  Negative for chills and fever.  HENT:  Negative for congestion and sore throat.   Eyes:  Negative for pain and discharge.  Respiratory:  Negative for cough and shortness of breath.   Cardiovascular:  Negative for chest pain and palpitations.  Gastrointestinal:  Negative for constipation, diarrhea, nausea and vomiting.  Endocrine: Negative for polydipsia and polyuria.  Genitourinary:  Negative for dysuria and hematuria.  Musculoskeletal:  Negative for neck pain and neck stiffness.  Skin:  Negative for rash.  Neurological:  Negative for dizziness, weakness, numbness and headaches.  Psychiatric/Behavioral:  Negative for agitation and behavioral problems.       Objective:    Physical Exam Vitals reviewed.  Constitutional:      General: He is not in acute distress.    Appearance: He is obese. He is not diaphoretic.  HENT:     Head: Normocephalic and atraumatic.     Nose: Nose normal.     Mouth/Throat:     Mouth: Mucous membranes are moist.  Eyes:     General: No scleral icterus.    Extraocular Movements: Extraocular movements intact.  Cardiovascular:     Rate and Rhythm: Normal rate and regular rhythm.      Heart sounds: Normal heart sounds. No murmur heard. Pulmonary:     Breath sounds: Normal breath sounds. No wheezing or rales.  Musculoskeletal:     Cervical back: Neck supple. No tenderness.     Right lower leg: No edema.     Left lower leg: No edema.  Skin:    General: Skin is warm.  Findings: No rash.  Neurological:     General: No focal deficit present.     Mental Status: He is alert and oriented to person, place, and time.     Sensory: No sensory deficit.     Motor: No weakness.  Psychiatric:        Mood and Affect: Mood normal.        Behavior: Behavior normal.     BP 131/76 (BP Location: Left Arm, Patient Position: Sitting, Cuff Size: Large)   Pulse 82   Ht '5\' 6"'$  (1.676 m)   Wt 298 lb 9.6 oz (135.4 kg)   SpO2 90%   BMI 48.20 kg/m  Wt Readings from Last 3 Encounters:  09/28/22 298 lb 9.6 oz (135.4 kg)  09/18/22 290 lb (131.5 kg)  08/31/22 300 lb (136.1 kg)    No results found for: "TSH" Lab Results  Component Value Date   WBC 8.7 08/14/2021   HGB 15.3 08/14/2021   HCT 45.4 08/14/2021   MCV 83 08/14/2021   PLT 166 08/14/2021   Lab Results  Component Value Date   NA 135 08/14/2021   K 4.6 08/14/2021   CO2 26 08/14/2021   GLUCOSE 332 (H) 08/14/2021   BUN 16 08/14/2021   CREATININE 1.29 (H) 08/14/2021   BILITOT 0.4 08/14/2021   ALKPHOS 125 (H) 08/14/2021   AST 15 08/14/2021   ALT 27 08/14/2021   PROT 6.8 08/14/2021   ALBUMIN 4.0 08/14/2021   CALCIUM 9.2 08/14/2021   ANIONGAP 10 08/17/2020   EGFR 68 08/14/2021   Lab Results  Component Value Date   CHOL 178 08/14/2021   Lab Results  Component Value Date   HDL 30 (L) 08/14/2021   Lab Results  Component Value Date   LDLCALC 101 (H) 08/14/2021   Lab Results  Component Value Date   TRIG 273 (H) 08/14/2021   Lab Results  Component Value Date   CHOLHDL 4.9 11/30/2020   Lab Results  Component Value Date   HGBA1C 10.4 06/22/2022      Assessment & Plan:   Problem List Items  Addressed This Visit       Cardiovascular and Mediastinum   Hypertension associated with diabetes ([place])    BP Readings from Last 1 Encounters:  09/28/22 131/76  Well controlled with olmesartan 40 mg daily and amlodipine 5 mg daily Counseled for compliance with the medications Advised DASH diet and moderate exercise/walking, at least 150 mins/week       Relevant Medications   Semaglutide,0.25 or 0.'5MG'$ /DOS, (OZEMPIC, 0.25 OR 0.5 MG/DOSE,) 2 MG/3ML SOPN   metFORMIN (GLUCOPHAGE-XR) 500 MG 24 hr tablet   amLODipine (NORVASC) 5 MG tablet   insulin glargine (LANTUS SOLOSTAR) 100 UNIT/ML Solostar Pen     Endocrine   Type 2 diabetes mellitus with hyperglycemia ([facility]) - Primary    Lab Results  Component Value Date   HGBA1C 10.4 06/22/2022   Uncontrolled On Novolin 70/30 -40 units every morning and 20 units nightly On Metformin 1000 mg BID and Glipizide 10 mg BID Has run out of Novolin Switched to Lantus 20 units twice daily Added Ozempic, increase dose as tolerated Advised to follow diabetic diet On ARB F/u CMP and lipid panel Diabetic eye exam: Advised to follow up with Ophthalmology for diabetic eye exam      Relevant Medications   Semaglutide,0.25 or 0.'5MG'$ /DOS, (OZEMPIC, 0.25 OR 0.5 MG/DOSE,) 2 MG/3ML SOPN   metFORMIN (GLUCOPHAGE-XR) 500 MG 24 hr tablet   insulin glargine (LANTUS SOLOSTAR)  100 UNIT/ML Solostar Pen   Other Relevant Orders   CMP14+EGFR   Hemoglobin A1c     Other   Mixed hyperlipidemia    Check lipid profile Plan to start statin      Relevant Medications   amLODipine (NORVASC) 5 MG tablet   Other Relevant Orders   Lipid Profile    Meds ordered this encounter  Medications   Semaglutide,0.25 or 0.'5MG'$ /DOS, (OZEMPIC, 0.25 OR 0.5 MG/DOSE,) 2 MG/3ML SOPN    Sig: Inject 0.25 mg into the skin every 7 (seven) days for 28 days, THEN 0.5 mg every 7 (seven) days.    Dispense:  3 mL    Refill:  1   metFORMIN (GLUCOPHAGE-XR) 500 MG 24 hr tablet    Sig: Take 2  tablets (1,000 mg total) by mouth 2 (two) times daily with a meal.    Dispense:  360 tablet    Refill:  1   amLODipine (NORVASC) 5 MG tablet    Sig: Take 1 tablet (5 mg total) by mouth daily.    Dispense:  90 tablet    Refill:  1   insulin glargine (LANTUS SOLOSTAR) 100 UNIT/ML Solostar Pen    Sig: Inject 20 Units into the skin daily.    Dispense:  15 mL    Refill:  2    Follow-up: Return in about 3 months (around 12/27/2022) for DM and HTN.

## 2022-09-28 NOTE — Assessment & Plan Note (Addendum)
Lab Results  Component Value Date   HGBA1C 10.4 06/22/2022   Uncontrolled On Novolin 70/30 -40 units every morning and 20 units nightly On Metformin 1000 mg BID and Glipizide 10 mg BID Has run out of Novolin Switched to Lantus 20 units twice daily Added Ozempic, increase dose as tolerated Advised to follow diabetic diet On ARB F/u CMP and lipid panel Diabetic eye exam: Advised to follow up with Ophthalmology for diabetic eye exam

## 2022-09-28 NOTE — Assessment & Plan Note (Signed)
BP Readings from Last 1 Encounters:  09/28/22 131/76   Well controlled with olmesartan 40 mg daily and amlodipine 5 mg daily Counseled for compliance with the medications Advised DASH diet and moderate exercise/walking, at least 150 mins/week

## 2022-09-28 NOTE — Patient Instructions (Signed)
Please start taking Ozempic 0.25 mg once weekly for 4 weeks and then 0.50 mg once weekly.  Please start taking Lantus 20 units twice daily.  Please continue to follow low carb diet and perform moderate exercise/walking at least 150 mins/week.

## 2022-09-28 NOTE — Assessment & Plan Note (Signed)
Check lipid profile Plan to start statin

## 2022-09-29 LAB — CMP14+EGFR
ALT: 23 IU/L (ref 0–44)
AST: 14 IU/L (ref 0–40)
Albumin/Globulin Ratio: 1.8 (ref 1.2–2.2)
Albumin: 4 g/dL — ABNORMAL LOW (ref 4.1–5.1)
Alkaline Phosphatase: 103 IU/L (ref 44–121)
BUN/Creatinine Ratio: 11 (ref 9–20)
BUN: 13 mg/dL (ref 6–24)
Bilirubin Total: 0.3 mg/dL (ref 0.0–1.2)
CO2: 25 mmol/L (ref 20–29)
Calcium: 8.8 mg/dL (ref 8.7–10.2)
Chloride: 98 mmol/L (ref 96–106)
Creatinine, Ser: 1.18 mg/dL (ref 0.76–1.27)
Globulin, Total: 2.2 g/dL (ref 1.5–4.5)
Glucose: 272 mg/dL — ABNORMAL HIGH (ref 70–99)
Potassium: 4.8 mmol/L (ref 3.5–5.2)
Sodium: 140 mmol/L (ref 134–144)
Total Protein: 6.2 g/dL (ref 6.0–8.5)
eGFR: 75 mL/min/{1.73_m2} (ref >59)

## 2022-09-29 LAB — LIPID PANEL
Chol/HDL Ratio: 5 ratio (ref 0.0–5.0)
Cholesterol, Total: 155 mg/dL (ref 100–199)
HDL: 31 mg/dL — ABNORMAL LOW (ref >39)
LDL Chol Calc (NIH): 93 mg/dL (ref 0–99)
Triglycerides: 180 mg/dL — ABNORMAL HIGH (ref 0–149)
VLDL Cholesterol Cal: 31 mg/dL (ref 5–40)

## 2022-09-29 LAB — HEMOGLOBIN A1C
Est. average glucose Bld gHb Est-mCnc: 226 mg/dL
Hgb A1c MFr Bld: 9.5 % — ABNORMAL HIGH (ref 4.8–5.6)

## 2022-10-01 ENCOUNTER — Other Ambulatory Visit: Payer: Self-pay

## 2022-10-01 ENCOUNTER — Telehealth: Payer: Self-pay

## 2022-10-01 ENCOUNTER — Other Ambulatory Visit: Payer: Self-pay | Admitting: Internal Medicine

## 2022-10-01 DIAGNOSIS — Z1152 Encounter for screening for COVID-19: Secondary | ICD-10-CM

## 2022-10-01 DIAGNOSIS — R6889 Other general symptoms and signs: Secondary | ICD-10-CM

## 2022-10-01 DIAGNOSIS — E782 Mixed hyperlipidemia: Secondary | ICD-10-CM

## 2022-10-01 MED ORDER — ROSUVASTATIN CALCIUM 10 MG PO TABS: 10.0000 mg | ORAL_TABLET | Freq: Every day | ORAL | 1 refills | Status: DC

## 2022-10-01 NOTE — Telephone Encounter (Signed)
Spoke to patient

## 2022-10-03 ENCOUNTER — Other Ambulatory Visit: Payer: Self-pay | Admitting: Internal Medicine

## 2022-10-03 ENCOUNTER — Encounter: Payer: Self-pay | Admitting: Internal Medicine

## 2022-10-03 DIAGNOSIS — J0110 Acute frontal sinusitis, unspecified: Secondary | ICD-10-CM

## 2022-10-03 LAB — COVID-19, FLU A+B AND RSV
Influenza A, NAA: NOT DETECTED
Influenza B, NAA: NOT DETECTED
RSV, NAA: NOT DETECTED
SARS-CoV-2, NAA: NOT DETECTED

## 2022-10-03 MED ORDER — AZITHROMYCIN 250 MG PO TABS: ORAL_TABLET | ORAL | 0 refills | Status: AC

## 2022-10-09 ENCOUNTER — Ambulatory Visit: Payer: Medicaid Other | Admitting: Gastroenterology

## 2022-10-09 ENCOUNTER — Encounter: Payer: Self-pay | Admitting: Gastroenterology

## 2022-10-09 VITALS — BP 134/73 | HR 98 | Temp 98.6°F | Resp 13 | Ht 66.0 in | Wt 287.0 lb

## 2022-10-09 DIAGNOSIS — D125 Benign neoplasm of sigmoid colon: Secondary | ICD-10-CM | POA: Diagnosis not present

## 2022-10-09 DIAGNOSIS — K635 Polyp of colon: Secondary | ICD-10-CM | POA: Diagnosis not present

## 2022-10-09 DIAGNOSIS — D124 Benign neoplasm of descending colon: Secondary | ICD-10-CM

## 2022-10-09 DIAGNOSIS — R195 Other fecal abnormalities: Secondary | ICD-10-CM

## 2022-10-09 DIAGNOSIS — Z1211 Encounter for screening for malignant neoplasm of colon: Secondary | ICD-10-CM

## 2022-10-09 DIAGNOSIS — D123 Benign neoplasm of transverse colon: Secondary | ICD-10-CM | POA: Diagnosis not present

## 2022-10-09 MED ORDER — SODIUM CHLORIDE 0.9 % IV SOLN: 500.0000 mL | INTRAVENOUS | Status: DC

## 2022-10-09 NOTE — Progress Notes (Signed)
Patient reports no changes to health or medications since pre visit.

## 2022-10-09 NOTE — Progress Notes (Signed)
Called to room to assist during endoscopic procedure.  Patient ID and intended procedure confirmed with present staff. Received instructions for my participation in the procedure from the performing physician.  

## 2022-10-09 NOTE — Progress Notes (Signed)
Report to pacu rn. Vss. Care resumed by rn. 

## 2022-10-09 NOTE — Patient Instructions (Signed)
Handouts on polyps and hemorrhoids given to patient. Await pathology results. Resume previous diet and continue present medications. Repeat colonoscopy for surveillance will be determined based off of pathology results.   YOU HAD AN ENDOSCOPIC PROCEDURE TODAY AT [facility]:   Refer to the procedure report that was given to you for any specific questions about what was found during the examination.  If the procedure report does not answer your questions, please call your gastroenterologist to clarify.  If you requested that your care partner not be given the details of your procedure findings, then the procedure report has been included in a sealed envelope for you to review at your convenience later.  YOU SHOULD EXPECT: Some feelings of bloating in the abdomen. Passage of more gas than usual.  Walking can help get rid of the air that was put into your GI tract during the procedure and reduce the bloating. If you had a lower endoscopy (such as a colonoscopy or flexible sigmoidoscopy) you may notice spotting of blood in your stool or on the toilet paper. If you underwent a bowel prep for your procedure, you may not have a normal bowel movement for a few days.  Please Note:  You might notice some irritation and congestion in your nose or some drainage.  This is from the oxygen used during your procedure.  There is no need for concern and it should clear up in a day or so.  SYMPTOMS TO REPORT IMMEDIATELY:  Following lower endoscopy (colonoscopy or flexible sigmoidoscopy):  Excessive amounts of blood in the stool  Significant tenderness or worsening of abdominal pains  Swelling of the abdomen that is new, acute  Fever of 100F or higher  For urgent or emergent issues, a gastroenterologist can be reached at any hour by calling [phone]. Do not use MyChart messaging for urgent concerns.    DIET:  We do recommend a small meal at first, but then you may proceed to your regular  diet.  Drink plenty of fluids but you should avoid alcoholic beverages for 24 hours.  ACTIVITY:  You should plan to take it easy for the rest of today and you should NOT DRIVE or use heavy machinery until tomorrow (because of the sedation medicines used during the test).    FOLLOW UP: Our staff will call the number listed on your records the next business day following your procedure.  We will call around 7:15- 8:00 am to check on you and address any questions or concerns that you may have regarding the information given to you following your procedure. If we do not reach you, we will leave a message.     If any biopsies were taken you will be contacted by phone or by letter within the next 1-3 weeks.  Please call us at [phone] if you have not heard about the biopsies in 3 weeks.    SIGNATURES/CONFIDENTIALITY: You and/or your care partner have signed paperwork which will be entered into your electronic medical record.  These signatures attest to the fact that that the information above on your After Visit Summary has been reviewed and is understood.  Full responsibility of the confidentiality of this discharge information lies with you and/or your care-partner.

## 2022-10-09 NOTE — Progress Notes (Signed)
See 09/28/2022 H&P, no changes

## 2022-10-09 NOTE — Op Note (Signed)
[facility] Patient Name: Tug Reum Procedure Date: 10/09/2022 10:19 AM MRN: [id number] Endoscopist: Ladene Artist , MD, [id number] Age: 52 Referring MD:  Date of Birth: 1970-12-18 Gender: Male Account #: [id number] Procedure:                Colonoscopy Indications:              Positive Cologuard test Medicines:                Monitored Anesthesia Care Procedure:                Pre-Anesthesia Assessment:                           - Prior to the procedure, a History and Physical                            was performed, and patient medications and                            allergies were reviewed. The patient's tolerance of                            previous anesthesia was also reviewed. The risks                            and benefits of the procedure and the sedation                            options and risks were discussed with the patient.                            All questions were answered, and informed consent                            was obtained. Prior Anticoagulants: The patient has                            taken no anticoagulant or antiplatelet agents. ASA                            Grade Assessment: III - A patient with severe                            systemic disease. After reviewing the risks and                            benefits, the patient was deemed in satisfactory                            condition to undergo the procedure.                           After obtaining informed consent, the colonoscope  was passed under direct vision. Throughout the                            procedure, the patient's blood pressure, pulse, and                            oxygen saturations were monitored continuously. The                            CF [id number] was introduced through the anus                            and advanced to the the cecum, identified by                            appendiceal orifice and  ileocecal valve. The                            ileocecal valve, appendiceal orifice, and rectum                            were photographed. The quality of the bowel                            preparation was good. The colonoscopy was performed                            without difficulty. The patient tolerated the                            procedure well. Scope In: 10:38:04 AM Scope Out: 10:58:24 AM Scope Withdrawal Time: 0 hours 15 minutes 31 seconds  Total Procedure Duration: 0 hours 20 minutes 20 seconds  Findings:                 The perianal and digital rectal examinations were                            normal.                           Four sessile polyps were found in the sigmoid                            colon, descending colon and transverse colon (2).                            The polyps were 5 to 7 mm in size. These polyps                            were removed with a cold snare. Resection and                            retrieval were complete.  There was a medium-sized lipoma, 18 mm in diameter,                            in the transverse colon.                           Internal hemorrhoids were found. The hemorrhoids                            were small and Grade I (internal hemorrhoids that                            do not prolapse).                           The exam was otherwise without abnormality on                            direct and retroflexion views. Complications:            No immediate complications. Estimated blood loss:                            None. Estimated Blood Loss:     Estimated blood loss: none. Impression:               - Four 5 to 7 mm polyps in the sigmoid colon, in                            the descending colon and in the transverse colon,                            removed with a cold snare. Resected and retrieved.                           - Medium-sized lipoma in the transverse colon.                            - Small internal hemorrhoids.                           - The examination was otherwise normal on direct                            and retroflexion views. Recommendation:           - Repeat colonoscopy after studies are complete for                            surveillance based on pathology results.                           - Patient has a contact number available for                            emergencies. The signs and symptoms  of potential                            delayed complications were discussed with the                            patient. Return to normal activities tomorrow.                            Written discharge instructions were provided to the                            patient.                           - Resume previous diet.                           - Continue present medications.                           - Await pathology results. Ladene Artist, MD 10/09/2022 11:06:00 AM This report has been signed electronically.

## 2022-10-10 ENCOUNTER — Telehealth: Payer: Self-pay

## 2022-10-10 NOTE — Telephone Encounter (Signed)
  Follow up Call-     10/09/2022   10:24 AM  Call back number  Post procedure Call Back phone  # [phone]  Permission to leave phone message Yes     Patient questions:  Do you have a fever, pain , or abdominal swelling? No. Pain Score  0 *  Have you tolerated food without any problems? Yes.    Have you been able to return to your normal activities? Yes.    Do you have any questions about your discharge instructions: Diet   No. Medications  No. Follow up visit  No.  Do you have questions or concerns about your Care? No.  Actions: * If pain score is 4 or above: No action needed, pain <4.

## 2022-10-18 ENCOUNTER — Encounter: Payer: Self-pay | Admitting: Gastroenterology

## 2022-10-22 ENCOUNTER — Other Ambulatory Visit: Payer: Self-pay | Admitting: Internal Medicine

## 2022-10-22 MED ORDER — CLONAZEPAM 1 MG PO TABS: 1.0000 mg | ORAL_TABLET | Freq: Every day | ORAL | 0 refills | Status: DC | PRN

## 2022-11-13 ENCOUNTER — Encounter: Payer: Self-pay | Admitting: Internal Medicine

## 2022-12-09 ENCOUNTER — Other Ambulatory Visit: Payer: Self-pay | Admitting: Internal Medicine

## 2022-12-09 DIAGNOSIS — E1165 Type 2 diabetes mellitus with hyperglycemia: Secondary | ICD-10-CM

## 2022-12-10 ENCOUNTER — Other Ambulatory Visit: Payer: Self-pay | Admitting: Internal Medicine

## 2022-12-10 DIAGNOSIS — E1165 Type 2 diabetes mellitus with hyperglycemia: Secondary | ICD-10-CM

## 2022-12-10 MED ORDER — SEMAGLUTIDE (1 MG/DOSE) 4 MG/3ML ~~LOC~~ SOPN: 1.0000 mg | PEN_INJECTOR | SUBCUTANEOUS | 3 refills | Status: DC

## 2022-12-11 ENCOUNTER — Encounter: Payer: Self-pay | Admitting: Internal Medicine

## 2022-12-27 ENCOUNTER — Encounter: Payer: Self-pay | Admitting: Internal Medicine

## 2022-12-27 ENCOUNTER — Ambulatory Visit: Payer: Medicaid Other | Admitting: Internal Medicine

## 2022-12-27 VITALS — BP 138/86 | HR 89 | Ht 69.0 in | Wt 286.4 lb

## 2022-12-27 DIAGNOSIS — N529 Male erectile dysfunction, unspecified: Secondary | ICD-10-CM

## 2022-12-27 DIAGNOSIS — E1159 Type 2 diabetes mellitus with other circulatory complications: Secondary | ICD-10-CM

## 2022-12-27 DIAGNOSIS — F410 Panic disorder [episodic paroxysmal anxiety] without agoraphobia: Secondary | ICD-10-CM

## 2022-12-27 DIAGNOSIS — Z794 Long term (current) use of insulin: Secondary | ICD-10-CM

## 2022-12-27 DIAGNOSIS — E1165 Type 2 diabetes mellitus with hyperglycemia: Secondary | ICD-10-CM

## 2022-12-27 DIAGNOSIS — E782 Mixed hyperlipidemia: Secondary | ICD-10-CM

## 2022-12-27 DIAGNOSIS — F331 Major depressive disorder, recurrent, moderate: Secondary | ICD-10-CM | POA: Insufficient documentation

## 2022-12-27 DIAGNOSIS — F330 Major depressive disorder, recurrent, mild: Secondary | ICD-10-CM

## 2022-12-27 DIAGNOSIS — I152 Hypertension secondary to endocrine disorders: Secondary | ICD-10-CM

## 2022-12-27 LAB — POCT GLYCOSYLATED HEMOGLOBIN (HGB A1C): HbA1c, POC (controlled diabetic range): 6.9 % (ref 0.0–7.0)

## 2022-12-27 MED ORDER — SILDENAFIL CITRATE 50 MG PO TABS: 50.0000 mg | ORAL_TABLET | Freq: Every day | ORAL | 2 refills | Status: DC | PRN

## 2022-12-27 MED ORDER — SERTRALINE HCL 25 MG PO TABS
25.0000 mg | ORAL_TABLET | Freq: Every day | ORAL | 3 refills | Status: DC
Start: 2022-12-27 — End: 2023-01-30

## 2022-12-27 MED ORDER — AMLODIPINE BESYLATE 2.5 MG PO TABS: 2.5000 mg | ORAL_TABLET | Freq: Every day | ORAL | 1 refills | Status: DC

## 2022-12-27 NOTE — Patient Instructions (Signed)
Please stop taking Lantus.  Please start taking Amlodipine 2.5 mg once daily instead of 5 mg.  Please continue to take other medications as prescribed.  Please continue to follow low carb diet and perform moderate exercise/walking at least 150 mins/week.  Please get fasting blood tests done before the next visit.

## 2022-12-27 NOTE — Assessment & Plan Note (Signed)
Check lipid profile On Crestor 

## 2022-12-27 NOTE — Assessment & Plan Note (Signed)
BP Readings from Last 1 Encounters:  12/27/22 138/86   Uncontrolled with olmesartan 40 mg daily Has stopped amlodipine 5 mg daily, but added at lower dose - 2.5 mg QD Counseled for compliance with the medications Advised DASH diet and moderate exercise/walking, at least 150 mins/week

## 2022-12-27 NOTE — Assessment & Plan Note (Addendum)
Lab Results  Component Value Date   HGBA1C 6.9 12/27/2022   Well-controlled now On Metformin 1000 mg BID and Glipizide 10 mg BID DC Lantus 20 units as he has had hypoglycemia and has stopped it for the last 2 weeks On Ozempic 1 mg qw, increase dose as tolerated Advised to follow diabetic diet On ARB and statin F/u CMP and lipid panel Diabetic eye exam: Advised to follow up with Ophthalmology for diabetic eye exam

## 2022-12-27 NOTE — Assessment & Plan Note (Signed)
Takes Clonazepam PRN

## 2022-12-27 NOTE — Assessment & Plan Note (Signed)
Flowsheet Row Office Visit from 12/27/2022 in [facility]  PHQ-9 Total Score 12      Started Zoloft 25 mg QD Has Klonopin as needed for panic episode

## 2022-12-27 NOTE — Progress Notes (Signed)
Established Patient Office Visit  Subjective:  Patient ID: Gary Thompson, male    DOB: Feb 10, 1971  Age: 52 y.o. MRN: [id number]  CC:  Chief Complaint  Patient presents with   Diabetes    Three month follow up. Patient states he has spells where his blood pressure and blood sugar will drop.    HPI Gary Thompson is a 52 y.o. male with past medical history of type II DM, HTN, HLD, panic disorder and OSA who presents for f/u of his chronic medical conditions.  Type II DM: He takes Ozempic 1 mg qw, metformin 1000 mg twice daily and glipizide 10 mg twice daily.  He has stopped.  Taking Lantus due to episodes of hypoglycemia.  His blood glucose log shows labile blood glucose, from 87 to 215, but is overall better compared to prior.  His last HbA1c was 6.9 today.  Denies any polyuria or polyphagia.  He is trying to improve his diet.  HTN: His BP was elevated today.  He has been taking olmesartan regularly, but has stopped amlodipine due to hypotension at home.  Denies any headache, dizziness, chest pain, dyspnea or palpitations.  MDD and panic disorder: He  has been more stressed and more anxious due to his current relationship with his girlfriend.  He stays with her and is worried that he would be told to leave if he has more arguments with her. He takes Klonopin as needed for severe anxiety.  He has been placed on antidepressants in the past.  Erectile dysfunction: He reports difficulty and maintaining erection.  Denies any dysuria, hematuria or urethral discharge currently.   Past Medical History:  Diagnosis Date   Anxiety    Arthritis    Depression    Diabetes mellitus without complication (HCC)    Hyperlipidemia    Hypertension    Sleep apnea    Vertigo     Past Surgical History:  Procedure Laterality Date   CHOLECYSTECTOMY N/A 08/11/2020   Procedure: LAPAROSCOPIC CHOLECYSTECTOMY;  Surgeon: Lucretia Roers, MD;  Location: [facility];  Service: General;  Laterality: N/A;    GALLBLADDER SURGERY     KNEE SURGERY     UMBILICAL HERNIA REPAIR N/A 08/11/2020   Procedure: HERNIA REPAIR UMBILICAL ADULT;  Surgeon: Lucretia Roers, MD;  Location: [facility];  Service: General;  Laterality: N/A;    Family History  Problem Relation Age of Onset   Cancer Mother    Hypertension Mother    Diabetes Mother    Colon polyps Father    Cancer Father    Hypertension Father    Diabetes Father    Colon cancer Paternal Grandmother        not sure of age of onset or death   Cancer Paternal Grandmother    COPD Paternal Grandfather    Esophageal cancer Neg Hx    Rectal cancer Neg Hx    Stomach cancer Neg Hx     Social History   Socioeconomic History   Marital status: Divorced    Spouse name: Not on file   Number of children: 0   Years of education: Not on file   Highest education level: Not on file  Occupational History   Not on file  Tobacco Use   Smoking status: Never   Smokeless tobacco: Never  Vaping Use   Vaping Use: Never used  Substance and Sexual Activity   Alcohol use: Yes    Comment: occ; usually less than 1 per week  Drug use: Not Currently   Sexual activity: Yes  Other Topics Concern   Not on file  Social History Narrative   Not on file   Social Determinants of Health   Financial Resource Strain: Not on file  Food Insecurity: Not on file  Transportation Needs: Not on file  Physical Activity: Not on file  Stress: Not on file  Social Connections: Not on file  Intimate Partner Violence: Not on file    Outpatient Medications Prior to Visit  Medication Sig Dispense Refill   clonazePAM (KLONOPIN) 1 MG tablet Take 1 tablet (1 mg total) by mouth daily as needed. 30 tablet 0   glipiZIDE (GLUCOTROL) 10 MG tablet Take 1 tablet (10 mg total) by mouth 2 (two) times daily. 180 tablet 3   metFORMIN (GLUCOPHAGE-XR) 500 MG 24 hr tablet Take 2 tablets (1,000 mg total) by mouth 2 (two) times daily with a meal. 360 tablet 1   olmesartan (BENICAR) 40 MG  tablet Take 1 tablet (40 mg total) by mouth daily. 90 tablet 1   omeprazole (PRILOSEC) 40 MG capsule Take 1 capsule (40 mg total) by mouth daily. 90 capsule 0   rosuvastatin (CRESTOR) 10 MG tablet Take 1 tablet (10 mg total) by mouth daily. 90 tablet 1   Semaglutide, 1 MG/DOSE, 4 MG/3ML SOPN Inject 1 mg as directed once a week. 3 mL 3   amLODipine (NORVASC) 5 MG tablet Take 1 tablet (5 mg total) by mouth daily. (Patient not taking: Reported on 12/27/2022) 90 tablet 1   insulin glargine (LANTUS SOLOSTAR) 100 UNIT/ML Solostar Pen Inject 20 Units into the skin daily. (Patient not taking: Reported on 12/27/2022) 15 mL 2   Insulin Pen Needle 32G X 4 MM MISC 1 each by Does not apply route daily. 90 each 3   No facility-administered medications prior to visit.    Allergies  Allergen Reactions   Dm-Doxylamine-Acetaminophen Hives and Shortness Of Breath    "Nyquill" and "Vicks 44-D" Hives States difficulty breathing    Dextromethorphan    Vicks Dayquil-Nyquil Cld & Flu [Pe-Dm-Apap & Doxylamin-Dm-Apap] Other (See Comments)    "overly sedated feeling'    ROS Review of Systems  Constitutional:  Negative for chills and fever.  HENT:  Negative for congestion and sore throat.   Eyes:  Negative for pain and discharge.  Respiratory:  Negative for cough and shortness of breath.   Cardiovascular:  Negative for chest pain and palpitations.  Gastrointestinal:  Negative for constipation, diarrhea, nausea and vomiting.  Endocrine: Negative for polydipsia and polyuria.  Genitourinary:  Negative for dysuria and hematuria.  Musculoskeletal:  Negative for neck pain and neck stiffness.  Skin:  Negative for rash.  Neurological:  Negative for dizziness, weakness, numbness and headaches.  Psychiatric/Behavioral:  Positive for dysphoric mood and sleep disturbance. Negative for agitation and behavioral problems. The patient is nervous/anxious.       Objective:    Physical Exam Vitals reviewed.  Constitutional:       General: He is not in acute distress.    Appearance: He is obese. He is not diaphoretic.  HENT:     Head: Normocephalic and atraumatic.     Nose: Nose normal.     Mouth/Throat:     Mouth: Mucous membranes are moist.  Eyes:     General: No scleral icterus.    Extraocular Movements: Extraocular movements intact.  Cardiovascular:     Rate and Rhythm: Normal rate and regular rhythm.     Heart sounds:  Normal heart sounds. No murmur heard. Pulmonary:     Breath sounds: Normal breath sounds. No wheezing or rales.  Musculoskeletal:     Cervical back: Neck supple. No tenderness.     Right lower leg: No edema.     Left lower leg: No edema.  Skin:    General: Skin is warm.     Findings: No rash.  Neurological:     General: No focal deficit present.     Mental Status: He is alert and oriented to person, place, and time.     Sensory: No sensory deficit.     Motor: No weakness.  Psychiatric:        Mood and Affect: Mood normal.        Behavior: Behavior normal.     BP 138/86 (BP Location: Left Arm, Patient Position: Sitting, Cuff Size: Large)   Pulse 89   Ht 5\' 9"  (1.753 m)   Wt 286 lb 6.4 oz (129.9 kg)   SpO2 94%   BMI 42.29 kg/m  Wt Readings from Last 3 Encounters:  12/27/22 286 lb 6.4 oz (129.9 kg)  10/09/22 287 lb (130.2 kg)  09/28/22 298 lb 9.6 oz (135.4 kg)    No results found for: "TSH" Lab Results  Component Value Date   WBC 8.7 08/14/2021   HGB 15.3 08/14/2021   HCT 45.4 08/14/2021   MCV 83 08/14/2021   PLT 166 08/14/2021   Lab Results  Component Value Date   NA 140 09/28/2022   K 4.8 09/28/2022   CO2 25 09/28/2022   GLUCOSE 272 (H) 09/28/2022   BUN 13 09/28/2022   CREATININE 1.18 09/28/2022   BILITOT 0.3 09/28/2022   ALKPHOS 103 09/28/2022   AST 14 09/28/2022   ALT 23 09/28/2022   PROT 6.2 09/28/2022   ALBUMIN 4.0 (L) 09/28/2022   CALCIUM 8.8 09/28/2022   ANIONGAP 10 08/17/2020   EGFR 75 09/28/2022   Lab Results  Component Value Date   CHOL  155 09/28/2022   Lab Results  Component Value Date   HDL 31 (L) 09/28/2022   Lab Results  Component Value Date   LDLCALC 93 09/28/2022   Lab Results  Component Value Date   TRIG 180 (H) 09/28/2022   Lab Results  Component Value Date   CHOLHDL 5.0 09/28/2022   Lab Results  Component Value Date   HGBA1C 6.9 12/27/2022      Assessment & Plan:   Problem List Items Addressed This Visit       Cardiovascular and Mediastinum   Hypertension associated with diabetes (HCC) - Primary    BP Readings from Last 1 Encounters:  12/27/22 138/86  Uncontrolled with olmesartan 40 mg daily Has stopped amlodipine 5 mg daily, but added at lower dose - 2.5 mg QD Counseled for compliance with the medications Advised DASH diet and moderate exercise/walking, at least 150 mins/week       Relevant Medications   amLODipine (NORVASC) 2.5 MG tablet   sildenafil (VIAGRA) 50 MG tablet     Endocrine   Type 2 diabetes mellitus with hyperglycemia (HCC)    Lab Results  Component Value Date   HGBA1C 6.9 12/27/2022   Well-controlled now On Metformin 1000 mg BID and Glipizide 10 mg BID DC Lantus 20 units as he has had hypoglycemia and has stopped it for the last 2 weeks On Ozempic 1 mg qw, increase dose as tolerated Advised to follow diabetic diet On ARB and statin F/u CMP and lipid panel  Diabetic eye exam: Advised to follow up with Ophthalmology for diabetic eye exam      Relevant Orders   CMP14+EGFR   Hemoglobin A1c   POCT glycosylated hemoglobin (Hb A1C) (Completed)     Other   Panic disorder    Takes Clonazepam PRN      Relevant Medications   sertraline (ZOLOFT) 25 MG tablet   Mixed hyperlipidemia    Check lipid profile On Crestor      Relevant Medications   amLODipine (NORVASC) 2.5 MG tablet   sildenafil (VIAGRA) 50 MG tablet   Other Relevant Orders   Lipid Profile   Mild episode of recurrent major depressive disorder (HCC)    Flowsheet Row Office Visit from 12/27/2022 in  [facility]  PHQ-9 Total Score 12     Started Zoloft 25 mg QD Has Klonopin as needed for panic episode      Relevant Medications   sertraline (ZOLOFT) 25 MG tablet   RESOLVED: Erectile dysfunction   Relevant Medications   sildenafil (VIAGRA) 50 MG tablet   Meds ordered this encounter  Medications   amLODipine (NORVASC) 2.5 MG tablet    Sig: Take 1 tablet (2.5 mg total) by mouth daily.    Dispense:  90 tablet    Refill:  1    DOSE CHANGE - 12/27/22   sildenafil (VIAGRA) 50 MG tablet    Sig: Take 1 tablet (50 mg total) by mouth daily as needed for erectile dysfunction.    Dispense:  10 tablet    Refill:  2   sertraline (ZOLOFT) 25 MG tablet    Sig: Take 1 tablet (25 mg total) by mouth daily.    Dispense:  30 tablet    Refill:  3    Follow-up: Return in about 4 months (around 04/29/2023) for DM and HTN.

## 2023-01-24 ENCOUNTER — Encounter: Payer: Self-pay | Admitting: Family Medicine

## 2023-01-24 ENCOUNTER — Ambulatory Visit (INDEPENDENT_AMBULATORY_CARE_PROVIDER_SITE_OTHER): Payer: PRIVATE HEALTH INSURANCE | Admitting: Family Medicine

## 2023-01-24 VITALS — BP 104/67 | HR 94 | Ht 69.0 in | Wt 263.1 lb

## 2023-01-24 DIAGNOSIS — E1159 Type 2 diabetes mellitus with other circulatory complications: Secondary | ICD-10-CM

## 2023-01-24 DIAGNOSIS — R42 Dizziness and giddiness: Secondary | ICD-10-CM

## 2023-01-24 DIAGNOSIS — Z7984 Long term (current) use of oral hypoglycemic drugs: Secondary | ICD-10-CM | POA: Diagnosis not present

## 2023-01-24 DIAGNOSIS — I152 Hypertension secondary to endocrine disorders: Secondary | ICD-10-CM | POA: Diagnosis not present

## 2023-01-24 MED ORDER — MECLIZINE HCL 12.5 MG PO TABS
12.5000 mg | ORAL_TABLET | Freq: Three times a day (TID) | ORAL | 0 refills | Status: DC | PRN
Start: 2023-01-24 — End: 2023-01-30

## 2023-01-24 MED ORDER — OLMESARTAN MEDOXOMIL 20 MG PO TABS
20.0000 mg | ORAL_TABLET | Freq: Every day | ORAL | 0 refills | Status: DC
Start: 2023-01-24 — End: 2023-03-19

## 2023-01-24 NOTE — Progress Notes (Signed)
Established Patient Office Visit  Subjective:  Patient ID: Gary Thompson, male    DOB: 02/24/1971  Age: 52 y.o. MRN: [id number]  CC:  Chief Complaint  Patient presents with   Dizziness    Pt reports dizziness, ears ringing, legs tingling and sob when talking. Feeling really fatigued.    HPI Gary Thompson is a 52 y.o. male presents today with complaints of dizziness, ringing in the ears, leg tingling and shortness of breath when talking.  He  For the details of today's visit, please refer to the assessment and plan.       Past Medical History:  Diagnosis Date   Anxiety    Arthritis    Depression    Diabetes mellitus without complication (HCC)    Hyperlipidemia    Hypertension    Sleep apnea    Vertigo     Past Surgical History:  Procedure Laterality Date   CHOLECYSTECTOMY N/A 08/11/2020   Procedure: LAPAROSCOPIC CHOLECYSTECTOMY;  Surgeon: Lucretia Roers, MD;  Location: [facility];  Service: General;  Laterality: N/A;   GALLBLADDER SURGERY     KNEE SURGERY     UMBILICAL HERNIA REPAIR N/A 08/11/2020   Procedure: HERNIA REPAIR UMBILICAL ADULT;  Surgeon: Lucretia Roers, MD;  Location: [facility];  Service: General;  Laterality: N/A;    Family History  Problem Relation Age of Onset   Cancer Mother    Hypertension Mother    Diabetes Mother    Colon polyps Father    Cancer Father    Hypertension Father    Diabetes Father    Colon cancer Paternal Grandmother        not sure of age of onset or death   Cancer Paternal Grandmother    COPD Paternal Grandfather    Esophageal cancer Neg Hx    Rectal cancer Neg Hx    Stomach cancer Neg Hx     Social History   Socioeconomic History   Marital status: Divorced    Spouse name: Not on file   Number of children: 0   Years of education: Not on file   Highest education level: GED or equivalent  Occupational History   Not on file  Tobacco Use   Smoking status: Never   Smokeless tobacco: Never  Vaping Use   Vaping  Use: Never used  Substance and Sexual Activity   Alcohol use: Yes    Comment: occ; usually less than 1 per week   Drug use: Not Currently   Sexual activity: Yes  Other Topics Concern   Not on file  Social History Narrative   Not on file   Social Determinants of Health   Financial Resource Strain: Patient Declined (01/23/2023)   Overall Financial Resource Strain (CARDIA)    Difficulty of Paying Living Expenses: Patient declined  Food Insecurity: Patient Declined (01/23/2023)   Hunger Vital Sign    Worried About Running Out of Food in the Last Year: Patient declined    Ran Out of Food in the Last Year: Patient declined  Transportation Needs: No Transportation Needs (01/23/2023)   PRAPARE - Administrator, Civil Service (Medical): No    Lack of Transportation (Non-Medical): No  Physical Activity: Insufficiently Active (01/23/2023)   Exercise Vital Sign    Days of Exercise per Week: 7 days    Minutes of Exercise per Session: 20 min  Stress: Stress Concern Present (01/23/2023)   [employer] of Occupational Health - Occupational Stress Questionnaire  Feeling of Stress : Very much  Social Connections: Socially Isolated (01/23/2023)   Social Connection and Isolation Panel [NHANES]    Frequency of Communication with Friends and Family: Once a week    Frequency of Social Gatherings with Friends and Family: Twice a week    Attends Religious Services: Never    Database administrator or Organizations: No    Attends Engineer, structural: Not on file    Marital Status: Divorced  Catering manager Violence: Not on file    Outpatient Medications Prior to Visit  Medication Sig Dispense Refill   amLODipine (NORVASC) 2.5 MG tablet Take 1 tablet (2.5 mg total) by mouth daily. 90 tablet 1   clonazePAM (KLONOPIN) 1 MG tablet Take 1 tablet (1 mg total) by mouth daily as needed. 30 tablet 0   glipiZIDE (GLUCOTROL) 10 MG tablet Take 1 tablet (10 mg total) by mouth 2 (two)  times daily. 180 tablet 3   metFORMIN (GLUCOPHAGE-XR) 500 MG 24 hr tablet Take 2 tablets (1,000 mg total) by mouth 2 (two) times daily with a meal. 360 tablet 1   omeprazole (PRILOSEC) 40 MG capsule Take 1 capsule (40 mg total) by mouth daily. 90 capsule 0   rosuvastatin (CRESTOR) 10 MG tablet Take 1 tablet (10 mg total) by mouth daily. 90 tablet 1   Semaglutide, 1 MG/DOSE, 4 MG/3ML SOPN Inject 1 mg as directed once a week. 3 mL 3   sertraline (ZOLOFT) 25 MG tablet Take 1 tablet (25 mg total) by mouth daily. 30 tablet 3   sildenafil (VIAGRA) 50 MG tablet Take 1 tablet (50 mg total) by mouth daily as needed for erectile dysfunction. 10 tablet 2   olmesartan (BENICAR) 40 MG tablet Take 1 tablet (40 mg total) by mouth daily. 90 tablet 1   No facility-administered medications prior to visit.    Allergies  Allergen Reactions   Dm-Doxylamine-Acetaminophen Hives and Shortness Of Breath    "Nyquill" and "Vicks 44-D" Hives States difficulty breathing    Dextromethorphan    Vicks Dayquil-Nyquil Cld & Flu [Pe-Dm-Apap & Doxylamin-Dm-Apap] Other (See Comments)    "overly sedated feeling'    ROS Review of Systems  Constitutional:  Positive for fatigue. Negative for fever.  Eyes:  Negative for visual disturbance.  Respiratory:  Negative for chest tightness and shortness of breath.   Cardiovascular:  Negative for chest pain and palpitations.  Musculoskeletal:        Paresthesia  Neurological:  Positive for dizziness. Negative for headaches.      Objective:    Physical Exam HENT:     Head: Normocephalic.     Right Ear: Hearing, tympanic membrane and external ear normal.     Left Ear: Hearing, tympanic membrane and external ear normal.     Ears:     Weber exam findings: Does not lateralize.    Nose: No congestion or rhinorrhea.     Mouth/Throat:     Mouth: Mucous membranes are moist.  Cardiovascular:     Rate and Rhythm: Regular rhythm.     Heart sounds: No murmur heard. Pulmonary:      Effort: No respiratory distress.     Breath sounds: Normal breath sounds.  Neurological:     Mental Status: He is alert and oriented to person, place, and time.     GCS: GCS eye subscore is 4. GCS verbal subscore is 5. GCS motor subscore is 6.     Cranial Nerves: No facial asymmetry.  Sensory: Sensation is intact.     Coordination: Coordination normal. Finger-Nose-Finger Test normal.     Gait: Gait normal.     BP 104/67   Pulse 94   Ht 5\' 9"  (1.753 m)   Wt 263 lb 1.3 oz (119.3 kg)   SpO2 94%   BMI 38.85 kg/m  Wt Readings from Last 3 Encounters:  01/24/23 263 lb 1.3 oz (119.3 kg)  12/27/22 286 lb 6.4 oz (129.9 kg)  10/09/22 287 lb (130.2 kg)    No results found for: "TSH" Lab Results  Component Value Date   WBC 8.7 08/14/2021   HGB 15.3 08/14/2021   HCT 45.4 08/14/2021   MCV 83 08/14/2021   PLT 166 08/14/2021   Lab Results  Component Value Date   NA 140 09/28/2022   K 4.8 09/28/2022   CO2 25 09/28/2022   GLUCOSE 272 (H) 09/28/2022   BUN 13 09/28/2022   CREATININE 1.18 09/28/2022   BILITOT 0.3 09/28/2022   ALKPHOS 103 09/28/2022   AST 14 09/28/2022   ALT 23 09/28/2022   PROT 6.2 09/28/2022   ALBUMIN 4.0 (L) 09/28/2022   CALCIUM 8.8 09/28/2022   ANIONGAP 10 08/17/2020   EGFR 75 09/28/2022   Lab Results  Component Value Date   CHOL 155 09/28/2022   Lab Results  Component Value Date   HDL 31 (L) 09/28/2022   Lab Results  Component Value Date   LDLCALC 93 09/28/2022   Lab Results  Component Value Date   TRIG 180 (H) 09/28/2022   Lab Results  Component Value Date   CHOLHDL 5.0 09/28/2022   Lab Results  Component Value Date   HGBA1C 6.9 12/27/2022      Assessment & Plan:  Hypertension associated with diabetes (HCC) Assessment & Plan: The patient has lost 20 pounds since 12/27/2022  He is currently on Ozempic 1 mg weekly He reports BPs in the 80s systolic and 60s diastolic He reports increased dizziness when changing position from a sitting  to standing position or with head movements He admits to minimal fluid intake He complains of increased dizziness, shortness of breath and tinnitus in the right ear, fatigue and chest shortness of breath with exertion Denies cardiovascular symptoms The patient does have a history of anxiety and reports increased anxiety in the past few weeks due to marital problems He admits to taking clonazepam more frequently than usual Orthostatic BP stable in the clinic with no significant drop in his BP from lying to sitting to standing He notes history of vertigo and has followed-up with ENT in the past and is aware of exercises to perform for symptom relief Given the patient's weight loss of 20 pounds, will decrease his olmesartan to 20 mg daily Encouraged to increase his fluid intake at least 64 ounces daily Will treat symptoms of vertigo with meclizine 12.5 mg 3 times daily as needed Encouraged to take vitamin B12 1000 mcg daily for paresthesia and tinnitus Encouraged to follow-up in a week with PCP   Orders: -     Olmesartan Medoxomil; Take 1 tablet (20 mg total) by mouth daily.  Dispense: 30 tablet; Refill: 0 -     BMP8+eGFR  Vertigo -     BMP8+EGFR -     Meclizine HCl; Take 1 tablet (12.5 mg total) by mouth 3 (three) times daily as needed for dizziness.  Dispense: 30 tablet; Refill: 0 -     Vitamin B12 -     CBC   Note: This  chart has been completed using Engineer, civil (consulting) software, and while attempts have been made to ensure accuracy, certain words and phrases may not be transcribed as intended.    Follow-up: Return in about 1 week (around 01/31/2023).   Gilmore Laroche, FNP

## 2023-01-24 NOTE — Patient Instructions (Addendum)
I appreciate the opportunity to provide care to you today!    Follow up: 1 week with PCP  Labs: please stop by the lab today to get your blood drawn (CBC and BMP)  Nonpharmacologic interventions Increased  water intake at least 64 ounces of water Arising slowly (from a supine or seated position) to standing Raising the head of the bed Limited exposure to hot, humid environments Avoidance of straining/Valsalva  Tinnitus Please start taking OTC vitamin B12 1000 mcg daily  Dizziness I recommend increasing fluid intake to at least 64 ounces daily Change positions slowly from a sitting to rising position Start taking olmesartan 20 mg daily You may take meclizine an hour before symptom onset   Please report to the ED for worsening of your symptoms if  not relief with current recommendations     Please continue to a heart-healthy diet and increase your physical activities. Try to exercise for at least five days a week.      It was a pleasure to see you and I look forward to continuing to work together on your health and well-being. Please do not hesitate to call the office if you need care or have questions about your care.   Have a wonderful day and week. With Gratitude, Gilmore Laroche MSN, FNP-BC

## 2023-01-24 NOTE — Assessment & Plan Note (Addendum)
The patient has lost 20 pounds since 12/27/2022  He is currently on Ozempic 1 mg weekly He reports BPs in the 80s systolic and 60s diastolic He reports increased dizziness when changing position from a sitting to standing position or with head movements He admits to minimal fluid intake He complains of increased dizziness, shortness of breath and tinnitus in the right ear, fatigue and chest shortness of breath with exertion Denies cardiovascular symptoms The patient does have a history of anxiety and reports increased anxiety in the past few weeks due to marital problems He admits to taking clonazepam more frequently than usual Orthostatic BP stable in the clinic with no significant drop in his BP from lying to sitting to standing He notes history of vertigo and has followed-up with ENT in the past and is aware of exercises to perform for symptom relief Given the patient's weight loss of 20 pounds, will decrease his olmesartan to 20 mg daily Encouraged to increase his fluid intake at least 64 ounces daily Will treat symptoms of vertigo with meclizine 12.5 mg 3 times daily as needed Encouraged to take vitamin B12 1000 mcg daily for paresthesia and tinnitus Encouraged to follow-up in a week with PCP

## 2023-01-25 ENCOUNTER — Telehealth: Payer: Self-pay | Admitting: Family Medicine

## 2023-01-25 LAB — BMP8+EGFR
BUN/Creatinine Ratio: 13 (ref 9–20)
BUN: 20 mg/dL (ref 6–24)
CO2: 24 mmol/L (ref 20–29)
Calcium: 9.4 mg/dL (ref 8.7–10.2)
Chloride: 100 mmol/L (ref 96–106)
Creatinine, Ser: 1.55 mg/dL — ABNORMAL HIGH (ref 0.76–1.27)
Glucose: 97 mg/dL (ref 70–99)
Potassium: 5.2 mmol/L (ref 3.5–5.2)
Sodium: 139 mmol/L (ref 134–144)
eGFR: 54 mL/min/{1.73_m2} — ABNORMAL LOW (ref >59)

## 2023-01-25 LAB — CBC
Hematocrit: 45.3 % (ref 37.5–51.0)
Hemoglobin: 15 g/dL (ref 13.0–17.7)
MCH: 27.9 pg (ref 26.6–33.0)
MCHC: 33.1 g/dL (ref 31.5–35.7)
MCV: 84 fL (ref 79–97)
Platelets: 237 10*3/uL (ref 150–450)
RBC: 5.37 x10E6/uL (ref 4.14–5.80)
RDW: 13 % (ref 11.6–15.4)
WBC: 11.6 10*3/uL — ABNORMAL HIGH (ref 3.4–10.8)

## 2023-01-25 NOTE — Telephone Encounter (Signed)
Patient returning a call about lab results. Concern on his kidney's

## 2023-01-30 ENCOUNTER — Ambulatory Visit: Payer: PRIVATE HEALTH INSURANCE | Admitting: Internal Medicine

## 2023-01-30 ENCOUNTER — Telehealth: Payer: Self-pay | Admitting: *Deleted

## 2023-01-30 ENCOUNTER — Telehealth: Payer: Self-pay

## 2023-01-30 ENCOUNTER — Encounter: Payer: Self-pay | Admitting: Internal Medicine

## 2023-01-30 ENCOUNTER — Ambulatory Visit: Payer: Self-pay

## 2023-01-30 VITALS — BP 150/80 | HR 89 | Resp 16 | Ht 69.0 in | Wt 268.0 lb

## 2023-01-30 DIAGNOSIS — F330 Major depressive disorder, recurrent, mild: Secondary | ICD-10-CM

## 2023-01-30 DIAGNOSIS — I152 Hypertension secondary to endocrine disorders: Secondary | ICD-10-CM | POA: Diagnosis not present

## 2023-01-30 DIAGNOSIS — E1165 Type 2 diabetes mellitus with hyperglycemia: Secondary | ICD-10-CM

## 2023-01-30 DIAGNOSIS — E1159 Type 2 diabetes mellitus with other circulatory complications: Secondary | ICD-10-CM

## 2023-01-30 MED ORDER — SERTRALINE HCL 50 MG PO TABS
50.0000 mg | ORAL_TABLET | Freq: Every day | ORAL | 1 refills | Status: DC
Start: 2023-01-30 — End: 2023-06-03

## 2023-01-30 NOTE — Assessment & Plan Note (Signed)
Patient's blood pressure was 150/80 on check today. He is not taking Olmesartan. He not experience any lightheadedness or dizziness on standing today He was orthostatic by description at his presentation on 5/30. Recommend not taking Meclizine Start decreased dose of Olmesartan recommended at last visit, Olmesartan 20 mg qd Follow up in 6 weeks

## 2023-01-30 NOTE — Patient Outreach (Signed)
Received a referral from Everitt Amber, CMA from [facility] for unstable housing.  I have sent this to the [facility] Scheduling Care Guide Gwenevere Ghazi who will assign a Social Worker to give Mr. Elenes a call.     Iverson Alamin, Donivan Scull [facility] Care Management Assistant [facility] Care Management [phone]

## 2023-01-30 NOTE — Patient Outreach (Signed)
  Care Coordination   Initial Visit Note   01/30/2023 Name: Gary Thompson MRN: [id number] DOB: Jun 22, 1971  Gary Thompson is a 52 y.o. year old male who sees Anabel Halon, MD for primary care. I spoke with  Gary Thompson by phone today.  What matters to the patients health and wellness today?  Patient is living temporary in the home with his girlfriend and her father.  Patient has been asked to move out by the girlfriends father.  Patient has started housing search but many of the properties are expensive.  Patient works and brings home $1200 bi-weekly. Patient agreed to talk to his girlfriend to decide if she will move with him and they can afford the $1100 apartment he has found.  Otherwise, patient is not sure he can afford the apartment on his own.  Another plan was suggested that patient could return to [place] with his family.  Patients father has property and client has a trailer that he can reside in [place].  If patient decides to stay in [place] and needs help with deposits, he is recommended to call DSS, [facility], and [facility].  Patient will not have money to move for another 30 days.      Goals Addressed   None     SDOH assessments and interventions completed:  Yes  SDOH Interventions Today    Flowsheet Row Most Recent Value  SDOH Interventions   Food Insecurity Interventions Intervention Not Indicated  Housing Interventions Other (Comment)  [Home does not belong to patient and only living tempor]  Transportation Interventions Intervention Not Indicated  Utilities Interventions Intervention Not Indicated        Care Coordination Interventions:  Yes, provided  Interventions Today    Flowsheet Row Most Recent Value  Chronic Disease   Chronic disease during today's visit Diabetes  General Interventions   General Interventions Discussed/Reviewed General Interventions Discussed, [employer] options needed, provided [email or web address]]        Follow up plan: No  further intervention required.   Encounter Outcome:  Pt. Visit Completed

## 2023-01-30 NOTE — Patient Instructions (Addendum)
Thank you, [name] for allowing [place] to provide your care today.   Depression  Increase, - sertraline (ZOLOFT) 50 MG tablet; Take 1 tablet (50 mg total) by mouth daily.  Dispense: 60 tablet; Refill: 1 Start counseling with your work  Hypertension Restart Olmesartan 20 mg Follow up in 6 weeks   We will check with our social worker to see if we have any resources for housing     Thurmon Fair, M.D.

## 2023-01-30 NOTE — Progress Notes (Signed)
   HPI:Mr.Min Sarra is a 52 y.o. male who presents 1 week follow up for dizziness. Patient presented last week with orthostatic hypotension and his Olmesartan was decreased from 40 mg to 20 mg daily. He has not been taking the blood pressure medication. He is no longer feeling dizzy. He had one episode when talking to coworkers. This was brought on by shortness of breath and increased anxiety. It improved with taking clonazepam.   He continues to have depression and anxiety. He is taking Zoloft 25 mg. He is taking Clonazepam. He has been referred to a counselor at this work. His relationship with his girlfriend has been strained. He is living with his girlfriend and dad. The living situation and relationship cause a lot of stress. Part of his girlfriend's culture is to wait on her dad , but patient feels her dad takes advantage of his girlfriend. He moved to this area to be close to the girlfriend but she has not shown interest in him lately. She has not wanted to have intercourse with him and he has not used Viagra prescribed.    Physical Exam: Vitals:   01/30/23 0950 01/30/23 1028  BP: (!) 155/82 (!) 150/80  Pulse: 89   Resp: 16   SpO2: 96%   Weight: 268 lb (121.6 kg)   Height: 5\' 9"  (1.753 m)      Physical Exam Cardiovascular:     Rate and Rhythm: Normal rate.     Heart sounds: No murmur heard. Pulmonary:     Effort: Pulmonary effort is normal.     Breath sounds: No wheezing or rales.  Musculoskeletal:     Right lower leg: No edema.     Left lower leg: No edema.  Psychiatric:        Attention and Perception: Attention normal.        Mood and Affect: Mood is anxious.        Speech: Speech is rapid and pressured and tangential.        Behavior: Behavior is hyperactive.        Thought Content: Thought content normal.        Cognition and Memory: Cognition normal.      Assessment & Plan:   Harmony was seen today for follow-up and back pain.  Mild episode of recurrent  major depressive disorder (HCC) Assessment & Plan: PHQ-9 score increased to 16 from 12 Exam demonstrated anxious mood and affect Is a chronic problem,uncontrolled Increase Zoloft from 25 mg to 50 mg Recommend following up with counselor offered through work My nurse is reaching out to our Child psychotherapist to see if they have resources to help patient find a stable living environment which may decrease his stress level  Orders: -     Sertraline HCl; Take 1 tablet (50 mg total) by mouth daily.  Dispense: 60 tablet; Refill: 1  Hypertension associated with diabetes (HCC) Assessment & Plan: Patient's blood pressure was 150/80 on check today. He is not taking Olmesartan. He not experience any lightheadedness or dizziness on standing today He was orthostatic by description at his presentation on 5/30. Recommend not taking Meclizine Start decreased dose of Olmesartan recommended at last visit, Olmesartan 20 mg qd Follow up in 6 weeks        Milus Banister, MD

## 2023-01-30 NOTE — Assessment & Plan Note (Signed)
PHQ-9 score increased to 16 from 12 Exam demonstrated anxious mood and affect Is a chronic problem,uncontrolled Increase Zoloft from 25 mg to 50 mg Recommend following up with counselor offered through work My nurse is reaching out to our Child psychotherapist to see if they have resources to help patient find a stable living environment which may decrease his stress level

## 2023-01-30 NOTE — Progress Notes (Signed)
  Care Coordination   Note   01/30/2023 Name: Gary Thompson MRN: [id number] DOB: 1971/06/28  Gary Thompson is a 52 y.o. year old male who sees Anabel Halon, MD for primary care. I reached out to Hettie Holstein by phone today in reference to a referral.   Care Coordination Consent Status: Patient agreed to call.   Follow up plan:  Telephone appointment with care coordination team member scheduled for:  6/5  Encounter Outcome:  Pt. Scheduled  [facility] Coordination Care Guide  Direct Dial: [phone]

## 2023-02-11 ENCOUNTER — Encounter: Payer: Self-pay | Admitting: Internal Medicine

## 2023-03-19 ENCOUNTER — Encounter: Payer: Self-pay | Admitting: Internal Medicine

## 2023-03-19 ENCOUNTER — Ambulatory Visit (INDEPENDENT_AMBULATORY_CARE_PROVIDER_SITE_OTHER): Payer: 59 | Admitting: Internal Medicine

## 2023-03-19 VITALS — BP 138/80 | HR 74 | Ht 69.0 in | Wt 266.0 lb

## 2023-03-19 DIAGNOSIS — Z23 Encounter for immunization: Secondary | ICD-10-CM | POA: Diagnosis not present

## 2023-03-19 DIAGNOSIS — I152 Hypertension secondary to endocrine disorders: Secondary | ICD-10-CM

## 2023-03-19 DIAGNOSIS — N529 Male erectile dysfunction, unspecified: Secondary | ICD-10-CM

## 2023-03-19 DIAGNOSIS — E1165 Type 2 diabetes mellitus with hyperglycemia: Secondary | ICD-10-CM

## 2023-03-19 DIAGNOSIS — E1159 Type 2 diabetes mellitus with other circulatory complications: Secondary | ICD-10-CM | POA: Diagnosis not present

## 2023-03-19 DIAGNOSIS — F331 Major depressive disorder, recurrent, moderate: Secondary | ICD-10-CM

## 2023-03-19 DIAGNOSIS — E782 Mixed hyperlipidemia: Secondary | ICD-10-CM

## 2023-03-19 DIAGNOSIS — Z794 Long term (current) use of insulin: Secondary | ICD-10-CM

## 2023-03-19 MED ORDER — SEMAGLUTIDE (2 MG/DOSE) 8 MG/3ML ~~LOC~~ SOPN: 2.0000 mg | PEN_INJECTOR | SUBCUTANEOUS | 3 refills | Status: DC

## 2023-03-19 MED ORDER — OLMESARTAN MEDOXOMIL 20 MG PO TABS
10.0000 mg | ORAL_TABLET | Freq: Every day | ORAL | 0 refills | Status: DC
Start: 2023-03-19 — End: 2023-05-02

## 2023-03-19 NOTE — Assessment & Plan Note (Signed)
Advised to take Viagra 2 tablets of 50 mg as needed

## 2023-03-19 NOTE — Patient Instructions (Signed)
Please start taking Ozempic 2 mg as prescribed.  Please continue taking Metformin.  Please start taking half tablet of Olmesartan for blood pressure.  Please continue to take medications as prescribed.  Please continue to follow low carb diet and perform moderate exercise/walking at least 150 mins/week.  Please get fasting blood tests done before the next visit.

## 2023-03-19 NOTE — Assessment & Plan Note (Addendum)
BP Readings from Last 1 Encounters:  03/19/23 138/80   Uncontrolled due to noncompliance DC amlodipine due to hypotension, also decreased dose of olmesartan to 10 mg QD Counseled for compliance with the medications Advised DASH diet and moderate exercise/walking, at least 150 mins/week

## 2023-03-19 NOTE — Progress Notes (Addendum)
Established Patient Office Visit  Subjective:  Patient ID: Gary Thompson, male    DOB: April 16, 1971  Age: 52 y.o. MRN: [id number]  CC:  Chief Complaint  Patient presents with   Diabetes    Six week follow up     HPI Gary Thompson is a 52 y.o. male with past medical history of type II DM, HTN, HLD, panic disorder and OSA who presents for f/u of his chronic medical conditions.  Type II DM: He takes Ozempic 1 mg qw, but has decreased dose of metformin to 500 mg twice daily and  has stopped taking glipizide 10 mg twice daily due to episodes of hypoglycemia.  His blood glucose has been labile blood glucose, from 60s to 150s, but is overall better since stopping glipizide compared to prior.  His last HbA1c was 6.9.  Denies any polyuria or polyphagia.  He is trying to improve his diet, but asks if he can take higher dose of Ozempic.  HTN: His BP was elevated today.  He has stopped taking olmesartan and amlodipine due to hypotension at home.  Denies any headache, dizziness, chest pain, dyspnea or palpitations.  MDD and panic disorder: He had been more stressed and more anxious due to his current relationship with his girlfriend.  He stays with her and was worried that he would be told to leave if he has more arguments with her.  He has noticed improvement in his symptoms since taking Zoloft.  He also reports that his relationship with his girlfriend and his father-in-law is currently stable.  He takes Klonopin as needed for severe anxiety.  Erectile dysfunction: He reports difficulty and maintaining erection despite taking Viagra 50 mg as needed.  Denies any dysuria, hematuria or urethral discharge currently.   Past Medical History:  Diagnosis Date   Anxiety    Arthritis    Depression    Diabetes mellitus without complication (HCC)    Hyperlipidemia    Hypertension    Sleep apnea    Vertigo     Past Surgical History:  Procedure Laterality Date   CHOLECYSTECTOMY N/A 08/11/2020    Procedure: LAPAROSCOPIC CHOLECYSTECTOMY;  Surgeon: Lucretia Roers, MD;  Location: [facility];  Service: General;  Laterality: N/A;   GALLBLADDER SURGERY     KNEE SURGERY     UMBILICAL HERNIA REPAIR N/A 08/11/2020   Procedure: HERNIA REPAIR UMBILICAL ADULT;  Surgeon: Lucretia Roers, MD;  Location: [facility];  Service: General;  Laterality: N/A;    Family History  Problem Relation Age of Onset   Cancer Mother    Hypertension Mother    Diabetes Mother    Colon polyps Father    Cancer Father    Hypertension Father    Diabetes Father    Colon cancer Paternal Grandmother        not sure of age of onset or death   Cancer Paternal Grandmother    COPD Paternal Grandfather    Esophageal cancer Neg Hx    Rectal cancer Neg Hx    Stomach cancer Neg Hx     Social History   Socioeconomic History   Marital status: Divorced    Spouse name: Not on file   Number of children: 0   Years of education: Not on file   Highest education level: GED or equivalent  Occupational History   Not on file  Tobacco Use   Smoking status: Never   Smokeless tobacco: Never  Vaping Use   Vaping status: Never Used  Substance and Sexual Activity   Alcohol use: Yes    Comment: occ; usually less than 1 per week   Drug use: Not Currently   Sexual activity: Yes  Other Topics Concern   Not on file  Social History Narrative   Not on file   Social Determinants of Health   Financial Resource Strain: Patient Declined (01/23/2023)   Overall Financial Resource Strain (CARDIA)    Difficulty of Paying Living Expenses: Patient declined  Food Insecurity: No Food Insecurity (01/30/2023)   Hunger Vital Sign    Worried About Running Out of Food in the Last Year: Never true    Ran Out of Food in the Last Year: Never true  Transportation Needs: No Transportation Needs (01/30/2023)   PRAPARE - Administrator, Civil Service (Medical): No    Lack of Transportation (Non-Medical): No  Physical Activity:  Insufficiently Active (01/23/2023)   Exercise Vital Sign    Days of Exercise per Week: 7 days    Minutes of Exercise per Session: 20 min  Stress: Stress Concern Present (01/23/2023)   [employer] of Occupational Health - Occupational Stress Questionnaire    Feeling of Stress : Very much  Social Connections: Socially Isolated (01/23/2023)   Social Connection and Isolation Panel [NHANES]    Frequency of Communication with Friends and Family: Once a week    Frequency of Social Gatherings with Friends and Family: Twice a week    Attends Religious Services: Never    Database administrator or Organizations: No    Attends Engineer, structural: Not on file    Marital Status: Divorced  Catering manager Violence: Not on file    Outpatient Medications Prior to Visit  Medication Sig Dispense Refill   clonazePAM (KLONOPIN) 1 MG tablet Take 1 tablet (1 mg total) by mouth daily as needed. 30 tablet 0   metFORMIN (GLUCOPHAGE-XR) 500 MG 24 hr tablet Take 2 tablets (1,000 mg total) by mouth 2 (two) times daily with a meal. 360 tablet 1   sertraline (ZOLOFT) 50 MG tablet Take 1 tablet (50 mg total) by mouth daily. 60 tablet 1   sildenafil (VIAGRA) 50 MG tablet Take 1 tablet (50 mg total) by mouth daily as needed for erectile dysfunction. 10 tablet 2   amLODipine (NORVASC) 2.5 MG tablet Take 1 tablet (2.5 mg total) by mouth daily. (Patient not taking: Reported on 01/30/2023) 90 tablet 1   glipiZIDE (GLUCOTROL) 10 MG tablet Take 1 tablet (10 mg total) by mouth 2 (two) times daily. 180 tablet 3   olmesartan (BENICAR) 20 MG tablet Take 1 tablet (20 mg total) by mouth daily. (Patient not taking: Reported on 01/30/2023) 30 tablet 0   omeprazole (PRILOSEC) 40 MG capsule Take 1 capsule (40 mg total) by mouth daily. 90 capsule 0   rosuvastatin (CRESTOR) 10 MG tablet Take 1 tablet (10 mg total) by mouth daily. 90 tablet 1   Semaglutide, 1 MG/DOSE, 4 MG/3ML SOPN Inject 1 mg as directed once a week. 3 mL 3    No facility-administered medications prior to visit.    Allergies  Allergen Reactions   Dm-Doxylamine-Acetaminophen Hives and Shortness Of Breath    "Nyquill" and "Vicks 44-D" Hives States difficulty breathing    Dextromethorphan    Vicks Dayquil-Nyquil Cld & Flu [Pe-Dm-Apap & Doxylamin-Dm-Apap] Other (See Comments)    "overly sedated feeling'    ROS Review of Systems  Constitutional:  Negative for chills and fever.  HENT:  Negative for  congestion and sore throat.   Eyes:  Negative for pain and discharge.  Respiratory:  Negative for cough and shortness of breath.   Cardiovascular:  Negative for chest pain and palpitations.  Gastrointestinal:  Negative for constipation, diarrhea, nausea and vomiting.  Endocrine: Negative for polydipsia and polyuria.  Genitourinary:  Negative for dysuria and hematuria.  Musculoskeletal:  Negative for neck pain and neck stiffness.  Skin:  Negative for rash.  Neurological:  Negative for dizziness, weakness, numbness and headaches.  Psychiatric/Behavioral:  Positive for dysphoric mood and sleep disturbance. Negative for agitation and behavioral problems. The patient is nervous/anxious.       Objective:    Physical Exam Vitals reviewed.  Constitutional:      General: He is not in acute distress.    Appearance: He is obese. He is not diaphoretic.  HENT:     Head: Normocephalic and atraumatic.     Nose: Nose normal.     Mouth/Throat:     Mouth: Mucous membranes are moist.  Eyes:     General: No scleral icterus.    Extraocular Movements: Extraocular movements intact.  Cardiovascular:     Rate and Rhythm: Normal rate and regular rhythm.     Heart sounds: Normal heart sounds. No murmur heard. Pulmonary:     Breath sounds: Normal breath sounds. No wheezing or rales.  Musculoskeletal:     Cervical back: Neck supple. No tenderness.     Right lower leg: No edema.     Left lower leg: No edema.  Skin:    General: Skin is warm.     Findings:  No rash.  Neurological:     General: No focal deficit present.     Mental Status: He is alert and oriented to person, place, and time.     Sensory: No sensory deficit.     Motor: No weakness.  Psychiatric:        Mood and Affect: Mood normal.        Behavior: Behavior normal.     BP 138/80 (BP Location: Left Arm)   Pulse 74   Ht 5\' 9"  (1.753 m)   Wt 266 lb (120.7 kg)   SpO2 94%   BMI 39.28 kg/m  Wt Readings from Last 3 Encounters:  05/02/23 255 lb 12.8 oz (116 kg)  03/19/23 266 lb (120.7 kg)  01/30/23 268 lb (121.6 kg)    No results found for: "TSH" Lab Results  Component Value Date   WBC 11.6 (H) 01/24/2023   HGB 15.0 01/24/2023   HCT 45.3 01/24/2023   MCV 84 01/24/2023   PLT 237 01/24/2023   Lab Results  Component Value Date   NA 140 04/30/2023   K 4.5 04/30/2023   CO2 26 04/30/2023   GLUCOSE 132 (H) 04/30/2023   BUN 16 04/30/2023   CREATININE 1.15 04/30/2023   BILITOT 0.4 04/30/2023   ALKPHOS 79 04/30/2023   AST 10 04/30/2023   ALT 12 04/30/2023   PROT 6.6 04/30/2023   ALBUMIN 4.1 04/30/2023   CALCIUM 9.3 04/30/2023   ANIONGAP 10 08/17/2020   EGFR 77 04/30/2023   Lab Results  Component Value Date   CHOL 91 (L) 04/30/2023   Lab Results  Component Value Date   HDL 29 (L) 04/30/2023   Lab Results  Component Value Date   LDLCALC 42 04/30/2023   Lab Results  Component Value Date   TRIG 109 04/30/2023   Lab Results  Component Value Date   CHOLHDL 3.1 04/30/2023  Lab Results  Component Value Date   HGBA1C 6.8 (H) 04/30/2023      Assessment & Plan:   Problem List Items Addressed This Visit       Cardiovascular and Mediastinum   Hypertension associated with diabetes (HCC)    BP Readings from Last 1 Encounters:  03/19/23 138/80   Uncontrolled due to noncompliance DC amlodipine due to hypotension, also decreased dose of olmesartan to 10 mg QD Counseled for compliance with the medications Advised DASH diet and moderate  exercise/walking, at least 150 mins/week      Relevant Medications   Semaglutide, 2 MG/DOSE, 8 MG/3ML SOPN   olmesartan (BENICAR) 20 MG tablet     Endocrine   Type 2 diabetes mellitus with hyperglycemia (HCC) - Primary    Lab Results  Component Value Date   HGBA1C 6.9 12/27/2022   Well-controlled, but has stopped taking glipizide and decreased dose of metformin by himself to avoid hypoglycemia On Metformin 500 mg BID, DC Glipizide 10 mg BID On Ozempic 1 mg qw, increase dose to 2 mg qw Advised to follow diabetic diet On ARB and statin F/u CMP and lipid panel Diabetic eye exam: Advised to follow up with Ophthalmology for diabetic eye exam      Relevant Medications   Semaglutide, 2 MG/DOSE, 8 MG/3ML SOPN   olmesartan (BENICAR) 20 MG tablet   Other Relevant Orders   CMP14+EGFR (Completed)   Hemoglobin A1c (Completed)     Other   Mixed hyperlipidemia    Check lipid profile On Crestor      Relevant Medications   olmesartan (BENICAR) 20 MG tablet   Other Relevant Orders   Lipid Profile (Completed)   Erectile dysfunction    Advised to take Viagra 2 tablets of 50 mg as needed      Moderate episode of recurrent major depressive disorder (HCC)    Flowsheet Row Office Visit from 03/19/2023 in [facility]  PHQ-9 Total Score 16      Started Zoloft 25 mg QD, was increased later to 50 mg due to uncontrolled MDD His anxiety is due to his current housing with his girlfriend Has Klonopin as needed for panic episode      Other Visit Diagnoses     Encounter for immunization       Relevant Orders   Tdap vaccine greater than or equal to 7yo IM (Completed)       Meds ordered this encounter  Medications   Semaglutide, 2 MG/DOSE, 8 MG/3ML SOPN    Sig: Inject 2 mg as directed once a week.    Dispense:  3 mL    Refill:  3   olmesartan (BENICAR) 20 MG tablet    Sig: Take 0.5 tablets (10 mg total) by mouth daily.    Dispense:  45 tablet    Refill:  0     Follow-up: Return if symptoms worsen or fail to improve.

## 2023-03-19 NOTE — Assessment & Plan Note (Signed)
Check lipid profile On Crestor

## 2023-03-19 NOTE — Assessment & Plan Note (Addendum)
Lab Results  Component Value Date   HGBA1C 6.9 12/27/2022   Well-controlled, but has stopped taking glipizide and decreased dose of metformin by himself to avoid hypoglycemia On Metformin 500 mg BID, DC Glipizide 10 mg BID On Ozempic 1 mg qw, increase dose to 2 mg qw Advised to follow diabetic diet On ARB and statin F/u CMP and lipid panel Diabetic eye exam: Advised to follow up with Ophthalmology for diabetic eye exam

## 2023-03-19 NOTE — Assessment & Plan Note (Addendum)
Flowsheet Row Office Visit from 03/19/2023 in [facility]  PHQ-9 Total Score 16      Started Zoloft 25 mg QD, was increased later to 50 mg due to uncontrolled MDD His anxiety is due to his current housing with his girlfriend Has Klonopin as needed for panic episode

## 2023-03-22 ENCOUNTER — Other Ambulatory Visit: Payer: Self-pay | Admitting: Internal Medicine

## 2023-03-22 DIAGNOSIS — K219 Gastro-esophageal reflux disease without esophagitis: Secondary | ICD-10-CM

## 2023-03-22 DIAGNOSIS — E782 Mixed hyperlipidemia: Secondary | ICD-10-CM

## 2023-05-02 ENCOUNTER — Ambulatory Visit (INDEPENDENT_AMBULATORY_CARE_PROVIDER_SITE_OTHER): Payer: 59 | Admitting: Internal Medicine

## 2023-05-02 ENCOUNTER — Encounter: Payer: Self-pay | Admitting: Internal Medicine

## 2023-05-02 VITALS — BP 126/76 | HR 84 | Ht 69.0 in | Wt 255.8 lb

## 2023-05-02 DIAGNOSIS — F331 Major depressive disorder, recurrent, moderate: Secondary | ICD-10-CM | POA: Diagnosis not present

## 2023-05-02 DIAGNOSIS — Z23 Encounter for immunization: Secondary | ICD-10-CM | POA: Diagnosis not present

## 2023-05-02 DIAGNOSIS — I152 Hypertension secondary to endocrine disorders: Secondary | ICD-10-CM

## 2023-05-02 DIAGNOSIS — N529 Male erectile dysfunction, unspecified: Secondary | ICD-10-CM

## 2023-05-02 DIAGNOSIS — E1165 Type 2 diabetes mellitus with hyperglycemia: Secondary | ICD-10-CM

## 2023-05-02 DIAGNOSIS — E1159 Type 2 diabetes mellitus with other circulatory complications: Secondary | ICD-10-CM

## 2023-05-02 DIAGNOSIS — E782 Mixed hyperlipidemia: Secondary | ICD-10-CM

## 2023-05-02 DIAGNOSIS — Z794 Long term (current) use of insulin: Secondary | ICD-10-CM

## 2023-05-02 DIAGNOSIS — F410 Panic disorder [episodic paroxysmal anxiety] without agoraphobia: Secondary | ICD-10-CM

## 2023-05-02 LAB — HM DIABETES EYE EXAM

## 2023-05-02 MED ORDER — CLONAZEPAM 1 MG PO TABS
1.0000 mg | ORAL_TABLET | Freq: Every day | ORAL | 0 refills | Status: DC | PRN
Start: 2023-05-02 — End: 2023-09-02

## 2023-05-02 MED ORDER — LISINOPRIL 2.5 MG PO TABS
2.5000 mg | ORAL_TABLET | Freq: Every day | ORAL | 5 refills | Status: DC
Start: 2023-05-02 — End: 2023-09-23

## 2023-05-02 NOTE — Patient Instructions (Signed)
Please start taking Lisinopril 2.5 mg once daily for blood pressure and kidney protection.  Please start taking Metformin 1000 mg twice daily.  Please continue to take other medications as prescribed.  Please continue to follow low carb diet and perform moderate exercise/walking at least 150 mins/week.  Please get fasting blood tests done before the next visit.

## 2023-05-02 NOTE — Assessment & Plan Note (Signed)
Checked lipid profile On Crestor

## 2023-05-02 NOTE — Assessment & Plan Note (Signed)
 Advised to take Viagra 2 tablets of 50 mg as needed

## 2023-05-02 NOTE — Progress Notes (Signed)
Established Patient Office Visit  Subjective:  Patient ID: Gary Thompson, male    DOB: 1971/06/15  Age: 52 y.o. MRN: [id number]  CC:  Chief Complaint  Patient presents with   Diabetes    Follow up    Hypertension    Follow up    HPI Gary Thompson is a 52 y.o. male with past medical history of type II DM, HTN, HLD, panic disorder and OSA who presents for f/u of his chronic medical conditions.  Type II DM: He takes Ozempic 2 mg qw and metformin to 500 mg twice daily. His blood glucose has been labile blood glucose, from 60s to 150s, but is overall better since stopping glipizide compared to prior.  His last HbA1c is 6.8.  Denies any polyuria or polyphagia.  He is trying to improve his diet.  HTN: His BP was wnl today.  He has stopped taking olmesartan and amlodipine due to hypotension at home.  Denies any headache, dizziness, chest pain, dyspnea or palpitations.  MDD and panic disorder: He had been more stressed and more anxious due to his current relationship with his girlfriend and his school ([employer] course).  He stays with her and was worried that he would be told to leave if he has more arguments with her.  He has noticed improvement in his symptoms since taking Zoloft.  He also reports that his relationship with his girlfriend and his father-in-law is currently stable.  He takes Klonopin as needed for severe anxiety.  Erectile dysfunction: He reports difficulty and maintaining erection, but has adequate response with Viagra 100 mg as needed.  Denies any dysuria, hematuria or urethral discharge currently.   Past Medical History:  Diagnosis Date   Anxiety    Arthritis    Depression    Diabetes mellitus without complication (HCC)    Hyperlipidemia    Hypertension    Sleep apnea    Vertigo     Past Surgical History:  Procedure Laterality Date   CHOLECYSTECTOMY N/A 08/11/2020   Procedure: LAPAROSCOPIC CHOLECYSTECTOMY;  Surgeon: Lucretia Roers, MD;  Location: [facility];   Service: General;  Laterality: N/A;   GALLBLADDER SURGERY     KNEE SURGERY     UMBILICAL HERNIA REPAIR N/A 08/11/2020   Procedure: HERNIA REPAIR UMBILICAL ADULT;  Surgeon: Lucretia Roers, MD;  Location: [facility];  Service: General;  Laterality: N/A;    Family History  Problem Relation Age of Onset   Cancer Mother    Hypertension Mother    Diabetes Mother    Colon polyps Father    Cancer Father    Hypertension Father    Diabetes Father    Colon cancer Paternal Grandmother        not sure of age of onset or death   Cancer Paternal Grandmother    COPD Paternal Grandfather    Esophageal cancer Neg Hx    Rectal cancer Neg Hx    Stomach cancer Neg Hx     Social History   Socioeconomic History   Marital status: Divorced    Spouse name: Not on file   Number of children: 0   Years of education: Not on file   Highest education level: GED or equivalent  Occupational History   Not on file  Tobacco Use   Smoking status: Never   Smokeless tobacco: Never  Vaping Use   Vaping status: Never Used  Substance and Sexual Activity   Alcohol use: Yes    Comment: occ; usually  less than 1 per week   Drug use: Not Currently   Sexual activity: Yes  Other Topics Concern   Not on file  Social History Narrative   Not on file   Social Determinants of Health   Financial Resource Strain: Patient Declined (01/23/2023)   Overall Financial Resource Strain (CARDIA)    Difficulty of Paying Living Expenses: Patient declined  Food Insecurity: No Food Insecurity (01/30/2023)   Hunger Vital Sign    Worried About Running Out of Food in the Last Year: Never true    Ran Out of Food in the Last Year: Never true  Transportation Needs: No Transportation Needs (01/30/2023)   PRAPARE - Administrator, Civil Service (Medical): No    Lack of Transportation (Non-Medical): No  Physical Activity: Insufficiently Active (01/23/2023)   Exercise Vital Sign    Days of Exercise per Week: 7 days     Minutes of Exercise per Session: 20 min  Stress: Stress Concern Present (01/23/2023)   [employer] of Occupational Health - Occupational Stress Questionnaire    Feeling of Stress : Very much  Social Connections: Socially Isolated (01/23/2023)   Social Connection and Isolation Panel [NHANES]    Frequency of Communication with Friends and Family: Once a week    Frequency of Social Gatherings with Friends and Family: Twice a week    Attends Religious Services: Never    Database administrator or Organizations: No    Attends Engineer, structural: Not on file    Marital Status: Divorced  Catering manager Violence: Not on file    Outpatient Medications Prior to Visit  Medication Sig Dispense Refill   metFORMIN (GLUCOPHAGE-XR) 500 MG 24 hr tablet Take 2 tablets (1,000 mg total) by mouth 2 (two) times daily with a meal. 360 tablet 1   omeprazole (PRILOSEC) 40 MG capsule Take 1 capsule by mouth once daily 90 capsule 0   rosuvastatin (CRESTOR) 10 MG tablet Take 1 tablet by mouth once daily 90 tablet 0   Semaglutide, 2 MG/DOSE, 8 MG/3ML SOPN Inject 2 mg as directed once a week. 3 mL 3   sertraline (ZOLOFT) 50 MG tablet Take 1 tablet (50 mg total) by mouth daily. 60 tablet 1   sildenafil (VIAGRA) 50 MG tablet Take 1 tablet (50 mg total) by mouth daily as needed for erectile dysfunction. 10 tablet 2   clonazePAM (KLONOPIN) 1 MG tablet Take 1 tablet (1 mg total) by mouth daily as needed. 30 tablet 0   olmesartan (BENICAR) 20 MG tablet Take 0.5 tablets (10 mg total) by mouth daily. 45 tablet 0   No facility-administered medications prior to visit.    Allergies  Allergen Reactions   Dm-Doxylamine-Acetaminophen Hives and Shortness Of Breath    "Nyquill" and "Vicks 44-D" Hives States difficulty breathing    Dextromethorphan    Vicks Dayquil-Nyquil Cld & Flu [Pe-Dm-Apap & Doxylamin-Dm-Apap] Other (See Comments)    "overly sedated feeling'    ROS Review of Systems  Constitutional:   Negative for chills and fever.  HENT:  Negative for congestion and sore throat.   Eyes:  Negative for pain and discharge.  Respiratory:  Negative for cough and shortness of breath.   Cardiovascular:  Negative for chest pain and palpitations.  Gastrointestinal:  Negative for constipation, diarrhea, nausea and vomiting.  Endocrine: Negative for polydipsia and polyuria.  Genitourinary:  Negative for dysuria and hematuria.  Musculoskeletal:  Negative for neck pain and neck stiffness.  Skin:  Negative  for rash.  Neurological:  Negative for dizziness, weakness, numbness and headaches.  Psychiatric/Behavioral:  Positive for dysphoric mood and sleep disturbance. Negative for agitation and behavioral problems. The patient is nervous/anxious.       Objective:    Physical Exam Vitals reviewed.  Constitutional:      General: He is not in acute distress.    Appearance: He is obese. He is not diaphoretic.  HENT:     Head: Normocephalic and atraumatic.     Nose: Nose normal.     Mouth/Throat:     Mouth: Mucous membranes are moist.  Eyes:     General: No scleral icterus.    Extraocular Movements: Extraocular movements intact.  Cardiovascular:     Rate and Rhythm: Normal rate and regular rhythm.     Heart sounds: Normal heart sounds. No murmur heard. Pulmonary:     Breath sounds: Normal breath sounds. No wheezing or rales.  Musculoskeletal:     Cervical back: Neck supple. No tenderness.     Right lower leg: No edema.     Left lower leg: No edema.  Skin:    General: Skin is warm.     Findings: No rash.  Neurological:     General: No focal deficit present.     Mental Status: He is alert and oriented to person, place, and time.     Sensory: No sensory deficit.     Motor: No weakness.  Psychiatric:        Mood and Affect: Mood normal.        Behavior: Behavior normal.     BP 126/76 (BP Location: Left Arm, Patient Position: Sitting, Cuff Size: Large)   Pulse 84   Ht 5\' 9"  (1.753 m)    Wt 255 lb 12.8 oz (116 kg)   SpO2 96%   BMI 37.78 kg/m  Wt Readings from Last 3 Encounters:  05/02/23 255 lb 12.8 oz (116 kg)  03/19/23 266 lb (120.7 kg)  01/30/23 268 lb (121.6 kg)    No results found for: "TSH" Lab Results  Component Value Date   WBC 11.6 (H) 01/24/2023   HGB 15.0 01/24/2023   HCT 45.3 01/24/2023   MCV 84 01/24/2023   PLT 237 01/24/2023   Lab Results  Component Value Date   NA 140 04/30/2023   K 4.5 04/30/2023   CO2 26 04/30/2023   GLUCOSE 132 (H) 04/30/2023   BUN 16 04/30/2023   CREATININE 1.15 04/30/2023   BILITOT 0.4 04/30/2023   ALKPHOS 79 04/30/2023   AST 10 04/30/2023   ALT 12 04/30/2023   PROT 6.6 04/30/2023   ALBUMIN 4.1 04/30/2023   CALCIUM 9.3 04/30/2023   ANIONGAP 10 08/17/2020   EGFR 77 04/30/2023   Lab Results  Component Value Date   CHOL 91 (L) 04/30/2023   Lab Results  Component Value Date   HDL 29 (L) 04/30/2023   Lab Results  Component Value Date   LDLCALC 42 04/30/2023   Lab Results  Component Value Date   TRIG 109 04/30/2023   Lab Results  Component Value Date   CHOLHDL 3.1 04/30/2023   Lab Results  Component Value Date   HGBA1C 6.8 (H) 04/30/2023      Assessment & Plan:   Problem List Items Addressed This Visit       Cardiovascular and Mediastinum   Hypertension associated with diabetes (HCC)    BP Readings from Last 1 Encounters:  05/02/23 126/76   Well-controlled DC amlodipine and  olmesartan due to hypotension Started Lisinopril 2.5 mg considering h/o DM Counseled for compliance with the medications Advised DASH diet and moderate exercise/walking, at least 150 mins/week      Relevant Medications   lisinopril (ZESTRIL) 2.5 MG tablet     Endocrine   Type 2 diabetes mellitus with hyperglycemia (HCC) - Primary    Lab Results  Component Value Date   HGBA1C 6.8 (H) 04/30/2023   Well-controlled On Metformin 500 mg BID, increased to 1000 mg BID On Ozempic 2 mg qw Advised to follow diabetic  diet On ARB and statin F/u CMP and lipid panel Diabetic eye exam: Advised to follow up with Ophthalmology for diabetic eye exam      Relevant Medications   lisinopril (ZESTRIL) 2.5 MG tablet     Other   Panic disorder    Takes Clonazepam PRN      Relevant Medications   clonazePAM (KLONOPIN) 1 MG tablet   Mixed hyperlipidemia    Checked lipid profile On Crestor      Relevant Medications   lisinopril (ZESTRIL) 2.5 MG tablet   Erectile dysfunction    Advised to take Viagra 2 tablets of 50 mg as needed      Moderate episode of recurrent major depressive disorder (HCC)    Flowsheet Row Office Visit from 05/02/2023 in [facility]  PHQ-9 Total Score 9      Better controlled now On Zoloft 50 mg QD His anxiety is due to his current housing with his girlfriend Has Klonopin as needed for panic episode      Other Visit Diagnoses     Encounter for immunization       Relevant Orders   Flu vaccine trivalent PF, 6mos and older(Flulaval,Afluria,Fluarix,Fluzone) (Completed)        Meds ordered this encounter  Medications   lisinopril (ZESTRIL) 2.5 MG tablet    Sig: Take 1 tablet (2.5 mg total) by mouth daily.    Dispense:  30 tablet    Refill:  5   clonazePAM (KLONOPIN) 1 MG tablet    Sig: Take 1 tablet (1 mg total) by mouth daily as needed.    Dispense:  30 tablet    Refill:  0    Follow-up: Return in about 4 months (around 09/01/2023) for Annual physical.

## 2023-05-02 NOTE — Assessment & Plan Note (Addendum)
Lab Results  Component Value Date   HGBA1C 6.8 (H) 04/30/2023   Well-controlled On Metformin 500 mg BID, increased to 1000 mg BID On Ozempic 2 mg qw Advised to follow diabetic diet On ARB and statin F/u CMP and lipid panel Diabetic eye exam: Advised to follow up with Ophthalmology for diabetic eye exam

## 2023-05-02 NOTE — Assessment & Plan Note (Addendum)
BP Readings from Last 1 Encounters:  05/02/23 126/76   Well-controlled DC amlodipine and olmesartan due to hypotension Started Lisinopril 2.5 mg considering h/o DM Counseled for compliance with the medications Advised DASH diet and moderate exercise/walking, at least 150 mins/week

## 2023-05-02 NOTE — Assessment & Plan Note (Signed)
Flowsheet Row Office Visit from 05/02/2023 in [facility]  PHQ-9 Total Score 9      Better controlled now On Zoloft 50 mg QD His anxiety is due to his current housing with his girlfriend Has Klonopin as needed for panic episode

## 2023-05-02 NOTE — Assessment & Plan Note (Signed)
Takes Clonazepam PRN

## 2023-05-09 ENCOUNTER — Telehealth: Payer: Self-pay | Admitting: Internal Medicine

## 2023-05-09 NOTE — Telephone Encounter (Signed)
Requested eye exam

## 2023-05-09 NOTE — Telephone Encounter (Signed)
Patient goes to [facility] in [place], [place] for his eye exams..  Has appt with specialist because bleeding behind eyes

## 2023-05-20 ENCOUNTER — Ambulatory Visit
Admission: EM | Admit: 2023-05-20 | Discharge: 2023-05-20 | Disposition: A | Discharge status: Home / Self Care | Payer: 59 | Attending: Nurse Practitioner | Admitting: Nurse Practitioner

## 2023-05-20 DIAGNOSIS — R21 Rash and other nonspecific skin eruption: Secondary | ICD-10-CM

## 2023-05-20 MED ORDER — TRIAMCINOLONE ACETONIDE 0.1 % EX CREA: 1.0000 | TOPICAL_CREAM | Freq: Two times a day (BID) | CUTANEOUS | 0 refills | Status: DC

## 2023-05-20 NOTE — ED Triage Notes (Addendum)
Pt c/o rash started on the back of his leg a,d then spread to his left arm and abdomen, pt has used hydrocortisone 10 and calamine lotion, has not helped with symptoms x 1 month

## 2023-05-20 NOTE — ED Provider Notes (Signed)
[facility]    CSN: [id number] Arrival date & time: 05/20/23  1540      History   Chief Complaint No chief complaint on file.   HPI Gary Thompson is a 52 y.o. male.   The history is provided by the patient.   Patient presents for complaints of rash to the left lower leg to the left forearm, and into his abdomen that is been present for more than 1 month.  Patient is unsure of what may have caused the rash.  He denies exposure to new soaps, medications, lotions, detergents, or foods.  He further denies fever, chills, abdominal pain, nausea, vomiting, or diarrhea.  Patient denies prior history of eczema or psoriasis.  He reports he has been using hydrocortisone 10, calamine lotion, and alcohol to the affected areas.  Patient report he is a type II diabetic.  Most recent hemoglobin A1c was 6.8 earlier this month.  Past Medical History:  Diagnosis Date   Anxiety    Arthritis    Depression    Diabetes mellitus without complication (HCC)    Hyperlipidemia    Hypertension    Sleep apnea    Vertigo     Patient Active Problem List   Diagnosis Date Noted   Erectile dysfunction 12/27/2022   Moderate episode of recurrent major depressive disorder (HCC) 12/27/2022   Mixed hyperlipidemia 09/28/2022   Positive colorectal cancer screening using Cologuard test 08/13/2022   Hypertension associated with diabetes (HCC) 08/14/2021   Encounter for general adult medical examination with abnormal findings 05/15/2021   OSA (obstructive sleep apnea) 05/15/2021   Panic disorder 08/31/2020   Type 2 diabetes mellitus with hyperglycemia (HCC) 08/31/2020   Hemorrhoids 08/31/2020    Past Surgical History:  Procedure Laterality Date   CHOLECYSTECTOMY N/A 08/11/2020   Procedure: LAPAROSCOPIC CHOLECYSTECTOMY;  Surgeon: Lucretia Roers, MD;  Location: [facility];  Service: General;  Laterality: N/A;   GALLBLADDER SURGERY     KNEE SURGERY     UMBILICAL HERNIA REPAIR N/A 08/11/2020    Procedure: HERNIA REPAIR UMBILICAL ADULT;  Surgeon: Lucretia Roers, MD;  Location: [facility];  Service: General;  Laterality: N/A;       Home Medications    Prior to Admission medications   Medication Sig Start Date End Date Taking? Authorizing Provider  triamcinolone cream (KENALOG) 0.1 % Apply 1 Application topically 2 (two) times daily. 05/20/23  Yes Bonney Berres-Warren, Sadie Haber, NP  clonazePAM (KLONOPIN) 1 MG tablet Take 1 tablet (1 mg total) by mouth daily as needed. 05/02/23   Anabel Halon, MD  lisinopril (ZESTRIL) 2.5 MG tablet Take 1 tablet (2.5 mg total) by mouth daily. 05/02/23   Anabel Halon, MD  metFORMIN (GLUCOPHAGE-XR) 500 MG 24 hr tablet Take 2 tablets (1,000 mg total) by mouth 2 (two) times daily with a meal. 09/28/22   Anabel Halon, MD  omeprazole (PRILOSEC) 40 MG capsule Take 1 capsule by mouth once daily 03/25/23   Anabel Halon, MD  rosuvastatin (CRESTOR) 10 MG tablet Take 1 tablet by mouth once daily 03/25/23   Anabel Halon, MD  Semaglutide, 2 MG/DOSE, 8 MG/3ML SOPN Inject 2 mg as directed once a week. 03/19/23   Anabel Halon, MD  sertraline (ZOLOFT) 50 MG tablet Take 1 tablet (50 mg total) by mouth daily. 01/30/23   Gardenia Phlegm, MD  sildenafil (VIAGRA) 50 MG tablet Take 1 tablet (50 mg total) by mouth daily as needed for erectile dysfunction. 12/27/22  Anabel Halon, MD    Family History Family History  Problem Relation Age of Onset   Cancer Mother    Hypertension Mother    Diabetes Mother    Colon polyps Father    Cancer Father    Hypertension Father    Diabetes Father    Colon cancer Paternal Grandmother        not sure of age of onset or death   Cancer Paternal Grandmother    COPD Paternal Grandfather    Esophageal cancer Neg Hx    Rectal cancer Neg Hx    Stomach cancer Neg Hx     Social History Social History   Tobacco Use   Smoking status: Never   Smokeless tobacco: Never  Vaping Use   Vaping status: Never Used  Substance Use Topics    Alcohol use: Yes    Comment: occ; usually less than 1 per week   Drug use: Not Currently     Allergies   Dm-doxylamine-acetaminophen, Dextromethorphan, and Vicks dayquil-nyquil cld & flu [pe-dm-apap & doxylamin-dm-apap]   Review of Systems Review of Systems Per HPI  Physical Exam Triage Vital Signs ED Triage Vitals [05/20/23 1749]  Encounter Vitals Group     BP 112/69     Systolic BP Percentile      Diastolic BP Percentile      Pulse Rate 80     Resp 15     Temp 98.2 F (36.8 C)     Temp Source Oral     SpO2 96 %     Weight      Height      Head Circumference      Peak Flow      Pain Score 0     Pain Loc      Pain Education      Exclude from Growth Chart    No data found.  Updated Vital Signs BP 112/69 (BP Location: Right Arm)   Pulse 80   Temp 98.2 F (36.8 C) (Oral)   Resp 15   SpO2 96%   Visual Acuity Right Eye Distance:   Left Eye Distance:   Bilateral Distance:    Right Eye Near:   Left Eye Near:    Bilateral Near:     Physical Exam Vitals and nursing note reviewed.  Constitutional:      General: He is not in acute distress.    Appearance: Normal appearance.  HENT:     Head: Normocephalic.     Mouth/Throat:     Mouth: Mucous membranes are moist.  Eyes:     Pupils: Pupils are equal, round, and reactive to light.  Pulmonary:     Effort: Pulmonary effort is normal.  Musculoskeletal:     Cervical back: Normal range of motion.  Skin:    General: Skin is warm and dry.     Findings: Rash present. Rash is macular.       Neurological:     General: No focal deficit present.     Mental Status: He is alert and oriented to person, place, and time.  Psychiatric:        Mood and Affect: Mood normal.        Behavior: Behavior normal.      UC Treatments / Results  Labs (all labs ordered are listed, but only abnormal results are displayed) Labs Reviewed - No data to display  EKG   Radiology No results found.  Procedures Procedures  (including critical care  time)  Medications Ordered in UC Medications - No data to display  Initial Impression / Assessment and Plan / UC Course  I have reviewed the triage vital signs and the nursing notes.  Pertinent labs & imaging results that were available during my care of the patient were reviewed by me and considered in my medical decision making (see chart for details).  The patient is well-appearing, he is in no acute distress, vital signs are stable.  Difficult to determine the cause of the patient's rash.  Will treat for rash and nonspecific skin eruption with triamcinolone cream 0.1%.  Supportive care recommendations were provided and discussed with the patient to include over-the-counter antihistamines, cool compresses to the affected area, and over-the-counter Aveeno colloidal bath to help with itching and drying.  Patient was advised to follow-up in this clinic or with his primary care physician if symptoms fail to improve.  Patient is in agreement with this plan of care and verbalizes understanding.  All questions were answered.  Patient stable for discharge. Final Clinical Impressions(s) / UC Diagnoses   Final diagnoses:  Rash and nonspecific skin eruption     Discharge Instructions      Take medication as prescribed. May also take over-the-counter Zyrtec (cetirizine) during the daytime to help with itching, and Benadryl at bedtime. Avoid hot baths or showers while symptoms persist.  Recommend taking lukewarm baths. May apply cool cloths to the area to help with itching or discomfort. Avoid scratching, rubbing, or manipulating the areas while symptoms persist. Recommend Aveeno colloidal oatmeal bath to use to help with drying and itching. If symptoms fail to improve with this treatment, you may follow-up in this clinic or with your primary care physician for further evaluation. Follow-up as needed.     ED Prescriptions     Medication Sig Dispense Auth. Provider    triamcinolone cream (KENALOG) 0.1 % Apply 1 Application topically 2 (two) times daily. 80 g Adelena Desantiago-Warren, Sadie Haber, NP      PDMP not reviewed this encounter.   Abran Cantor, NP 05/20/23 [phone]

## 2023-05-20 NOTE — Discharge Instructions (Addendum)
Take medication as prescribed. May also take over-the-counter Zyrtec (cetirizine) during the daytime to help with itching, and Benadryl at bedtime. Avoid hot baths or showers while symptoms persist.  Recommend taking lukewarm baths. May apply cool cloths to the area to help with itching or discomfort. Avoid scratching, rubbing, or manipulating the areas while symptoms persist. Recommend Aveeno colloidal oatmeal bath to use to help with drying and itching. If symptoms fail to improve with this treatment, you may follow-up in this clinic or with your primary care physician for further evaluation. Follow-up as needed.

## 2023-05-22 ENCOUNTER — Encounter: Payer: Self-pay | Admitting: Internal Medicine

## 2023-05-27 ENCOUNTER — Encounter (INDEPENDENT_AMBULATORY_CARE_PROVIDER_SITE_OTHER): Payer: 59 | Admitting: Ophthalmology

## 2023-05-27 DIAGNOSIS — E113391 Type 2 diabetes mellitus with moderate nonproliferative diabetic retinopathy without macular edema, right eye: Secondary | ICD-10-CM

## 2023-05-27 DIAGNOSIS — I10 Essential (primary) hypertension: Secondary | ICD-10-CM

## 2023-05-27 DIAGNOSIS — Z7985 Long-term (current) use of injectable non-insulin antidiabetic drugs: Secondary | ICD-10-CM

## 2023-05-27 DIAGNOSIS — Z7984 Long term (current) use of oral hypoglycemic drugs: Secondary | ICD-10-CM | POA: Diagnosis not present

## 2023-05-27 DIAGNOSIS — E113312 Type 2 diabetes mellitus with moderate nonproliferative diabetic retinopathy with macular edema, left eye: Secondary | ICD-10-CM

## 2023-05-27 DIAGNOSIS — H35033 Hypertensive retinopathy, bilateral: Secondary | ICD-10-CM

## 2023-05-27 DIAGNOSIS — H43813 Vitreous degeneration, bilateral: Secondary | ICD-10-CM

## 2023-05-28 ENCOUNTER — Ambulatory Visit (INDEPENDENT_AMBULATORY_CARE_PROVIDER_SITE_OTHER): Payer: 59 | Admitting: Internal Medicine

## 2023-05-28 ENCOUNTER — Encounter: Payer: Self-pay | Admitting: Internal Medicine

## 2023-05-28 VITALS — BP 114/77 | HR 90 | Ht 66.0 in | Wt 247.4 lb

## 2023-05-28 DIAGNOSIS — L239 Allergic contact dermatitis, unspecified cause: Secondary | ICD-10-CM | POA: Diagnosis not present

## 2023-05-28 MED ORDER — CLOBETASOL PROPIONATE 0.05 % EX CREA: 1.0000 | TOPICAL_CREAM | Freq: Two times a day (BID) | CUTANEOUS | 0 refills | Status: DC

## 2023-05-28 NOTE — Assessment & Plan Note (Addendum)
ER chart reviewed Has tried Kenalog cream, improved the rash over arms and trunk Has persistent rash over bilateral legs Started clobetasol cream Avoid oral steroids due to history of uncontrolled type II DM Apply lotion or moisturizer for dry skin

## 2023-05-28 NOTE — Patient Instructions (Signed)
Please apply Clobetasol cream over leg rash.  Please avoid scratching the area.  Please start taking Lisinopril.

## 2023-05-28 NOTE — Progress Notes (Signed)
Acute Office Visit  Subjective:    Patient ID: Gary Thompson, male    DOB: 1971/01/09, 52 y.o.   MRN: [id number]  Chief Complaint  Patient presents with   Rash    HPI Patient is in today for evaluation of a rash on bilateral legs X 1 month.  He recently went to ER and was given Kenalog cream.  His rash over arms and abdominal area have resolved now, but has persistent rash on bilateral legs.  He has intense itching over the area.  Denies any fever or chills.  Denies exposure to new chemicals, such as soap, shampoo, lotion, detergent or cleaning supplies.  He reports having mold at home and attributes rash to it.  Past Medical History:  Diagnosis Date   Anxiety    Arthritis    Depression    Diabetes mellitus without complication (HCC)    Hyperlipidemia    Hypertension    Sleep apnea    Vertigo     Past Surgical History:  Procedure Laterality Date   CHOLECYSTECTOMY N/A 08/11/2020   Procedure: LAPAROSCOPIC CHOLECYSTECTOMY;  Surgeon: Lucretia Roers, MD;  Location: [facility];  Service: General;  Laterality: N/A;   GALLBLADDER SURGERY     KNEE SURGERY     UMBILICAL HERNIA REPAIR N/A 08/11/2020   Procedure: HERNIA REPAIR UMBILICAL ADULT;  Surgeon: Lucretia Roers, MD;  Location: [facility];  Service: General;  Laterality: N/A;    Family History  Problem Relation Age of Onset   Cancer Mother    Hypertension Mother    Diabetes Mother    Colon polyps Father    Cancer Father    Hypertension Father    Diabetes Father    Colon cancer Paternal Grandmother        not sure of age of onset or death   Cancer Paternal Grandmother    COPD Paternal Grandfather    Esophageal cancer Neg Hx    Rectal cancer Neg Hx    Stomach cancer Neg Hx     Social History   Socioeconomic History   Marital status: Divorced    Spouse name: Not on file   Number of children: 0   Years of education: Not on file   Highest education level: GED or equivalent  Occupational History   Not on file   Tobacco Use   Smoking status: Never   Smokeless tobacco: Never  Vaping Use   Vaping status: Never Used  Substance and Sexual Activity   Alcohol use: Yes    Comment: occ; usually less than 1 per week   Drug use: Not Currently   Sexual activity: Yes  Other Topics Concern   Not on file  Social History Narrative   Not on file   Social Determinants of Health   Financial Resource Strain: Patient Declined (01/23/2023)   Overall Financial Resource Strain (CARDIA)    Difficulty of Paying Living Expenses: Patient declined  Food Insecurity: No Food Insecurity (01/30/2023)   Hunger Vital Sign    Worried About Running Out of Food in the Last Year: Never true    Ran Out of Food in the Last Year: Never true  Transportation Needs: No Transportation Needs (01/30/2023)   PRAPARE - Administrator, Civil Service (Medical): No    Lack of Transportation (Non-Medical): No  Physical Activity: Insufficiently Active (01/23/2023)   Exercise Vital Sign    Days of Exercise per Week: 7 days    Minutes of Exercise per Session:  20 min  Stress: Stress Concern Present (01/23/2023)   [employer] of Occupational Health - Occupational Stress Questionnaire    Feeling of Stress : Very much  Social Connections: Socially Isolated (01/23/2023)   Social Connection and Isolation Panel [NHANES]    Frequency of Communication with Friends and Family: Once a week    Frequency of Social Gatherings with Friends and Family: Twice a week    Attends Religious Services: Never    Database administrator or Organizations: No    Attends Engineer, structural: Not on file    Marital Status: Divorced  Catering manager Violence: Not on file    Outpatient Medications Prior to Visit  Medication Sig Dispense Refill   clonazePAM (KLONOPIN) 1 MG tablet Take 1 tablet (1 mg total) by mouth daily as needed. 30 tablet 0   lisinopril (ZESTRIL) 2.5 MG tablet Take 1 tablet (2.5 mg total) by mouth daily. 30 tablet 5    metFORMIN (GLUCOPHAGE-XR) 500 MG 24 hr tablet Take 2 tablets (1,000 mg total) by mouth 2 (two) times daily with a meal. 360 tablet 1   omeprazole (PRILOSEC) 40 MG capsule Take 1 capsule by mouth once daily 90 capsule 0   rosuvastatin (CRESTOR) 10 MG tablet Take 1 tablet by mouth once daily 90 tablet 0   Semaglutide, 2 MG/DOSE, 8 MG/3ML SOPN Inject 2 mg as directed once a week. 3 mL 3   sertraline (ZOLOFT) 50 MG tablet Take 1 tablet (50 mg total) by mouth daily. 60 tablet 1   sildenafil (VIAGRA) 50 MG tablet Take 1 tablet (50 mg total) by mouth daily as needed for erectile dysfunction. 10 tablet 2   triamcinolone cream (KENALOG) 0.1 % Apply 1 Application topically 2 (two) times daily. 80 g 0   No facility-administered medications prior to visit.    Allergies  Allergen Reactions   Dm-Doxylamine-Acetaminophen Hives and Shortness Of Breath    "Nyquill" and "Vicks 44-D" Hives States difficulty breathing    Dextromethorphan    Vicks Dayquil-Nyquil Cld & Flu [Pe-Dm-Apap & Doxylamin-Dm-Apap] Other (See Comments)    "overly sedated feeling'    Review of Systems  Constitutional:  Negative for chills and fever.  HENT:  Negative for congestion and sore throat.   Eyes:  Negative for pain and discharge.  Respiratory:  Negative for cough and shortness of breath.   Cardiovascular:  Negative for chest pain and palpitations.  Gastrointestinal:  Negative for constipation, diarrhea, nausea and vomiting.  Endocrine: Negative for polydipsia and polyuria.  Genitourinary:  Negative for dysuria and hematuria.  Musculoskeletal:  Negative for neck pain and neck stiffness.  Skin:  Positive for rash.  Neurological:  Negative for dizziness, weakness, numbness and headaches.  Psychiatric/Behavioral:  Positive for dysphoric mood and sleep disturbance. Negative for agitation and behavioral problems. The patient is nervous/anxious.        Objective:    Physical Exam Vitals reviewed.  Constitutional:       General: He is not in acute distress.    Appearance: He is obese. He is not diaphoretic.  HENT:     Head: Normocephalic and atraumatic.     Nose: Nose normal.     Mouth/Throat:     Mouth: Mucous membranes are moist.  Eyes:     General: No scleral icterus.    Extraocular Movements: Extraocular movements intact.  Cardiovascular:     Rate and Rhythm: Normal rate and regular rhythm.     Heart sounds: Normal heart sounds. No  murmur heard. Pulmonary:     Breath sounds: Normal breath sounds. No wheezing or rales.  Musculoskeletal:     Cervical back: Neck supple. No tenderness.     Right lower leg: No edema.     Left lower leg: No edema.  Skin:    General: Skin is warm.     Findings: Rash (Erythematous patches over bilateral shin area) present.  Neurological:     General: No focal deficit present.     Mental Status: He is alert and oriented to person, place, and time.     Sensory: No sensory deficit.     Motor: No weakness.  Psychiatric:        Mood and Affect: Mood normal.        Behavior: Behavior normal.     BP 114/77 (BP Location: Left Arm, Patient Position: Sitting, Cuff Size: Normal)   Pulse 90   Ht 5\' 6"  (1.676 m)   Wt 247 lb 6.4 oz (112.2 kg)   SpO2 95%   BMI 39.93 kg/m  Wt Readings from Last 3 Encounters:  05/28/23 247 lb 6.4 oz (112.2 kg)  05/02/23 255 lb 12.8 oz (116 kg)  03/19/23 266 lb (120.7 kg)        Assessment & Plan:   Problem List Items Addressed This Visit       Musculoskeletal and Integument   Allergic dermatitis - Primary    ER chart reviewed Has tried Kenalog cream, improved the rash over arms and trunk Has persistent rash over bilateral legs Started clobetasol cream Avoid oral steroids due to history of uncontrolled type II DM Apply lotion or moisturizer for dry skin      Relevant Medications   clobetasol cream (TEMOVATE) 0.05 %     Meds ordered this encounter  Medications   clobetasol cream (TEMOVATE) 0.05 %    Sig: Apply 1  Application topically 2 (two) times daily.    Dispense:  30 g    Refill:  0     Wyeth Hoffer Concha Se, MD

## 2023-05-30 ENCOUNTER — Other Ambulatory Visit: Payer: Self-pay | Admitting: Internal Medicine

## 2023-05-30 DIAGNOSIS — K219 Gastro-esophageal reflux disease without esophagitis: Secondary | ICD-10-CM

## 2023-05-30 DIAGNOSIS — E1165 Type 2 diabetes mellitus with hyperglycemia: Secondary | ICD-10-CM

## 2023-05-31 IMAGING — DX DG RIBS W/ CHEST 3+V*R*
5 series · 5 of 5 positions shown · non-contrast
Comparison: Radiograph 08/10/2020

CLINICAL DATA: Pain

EXAM:
RIGHT RIBS AND CHEST - 3+ VIEW

[chest pa]
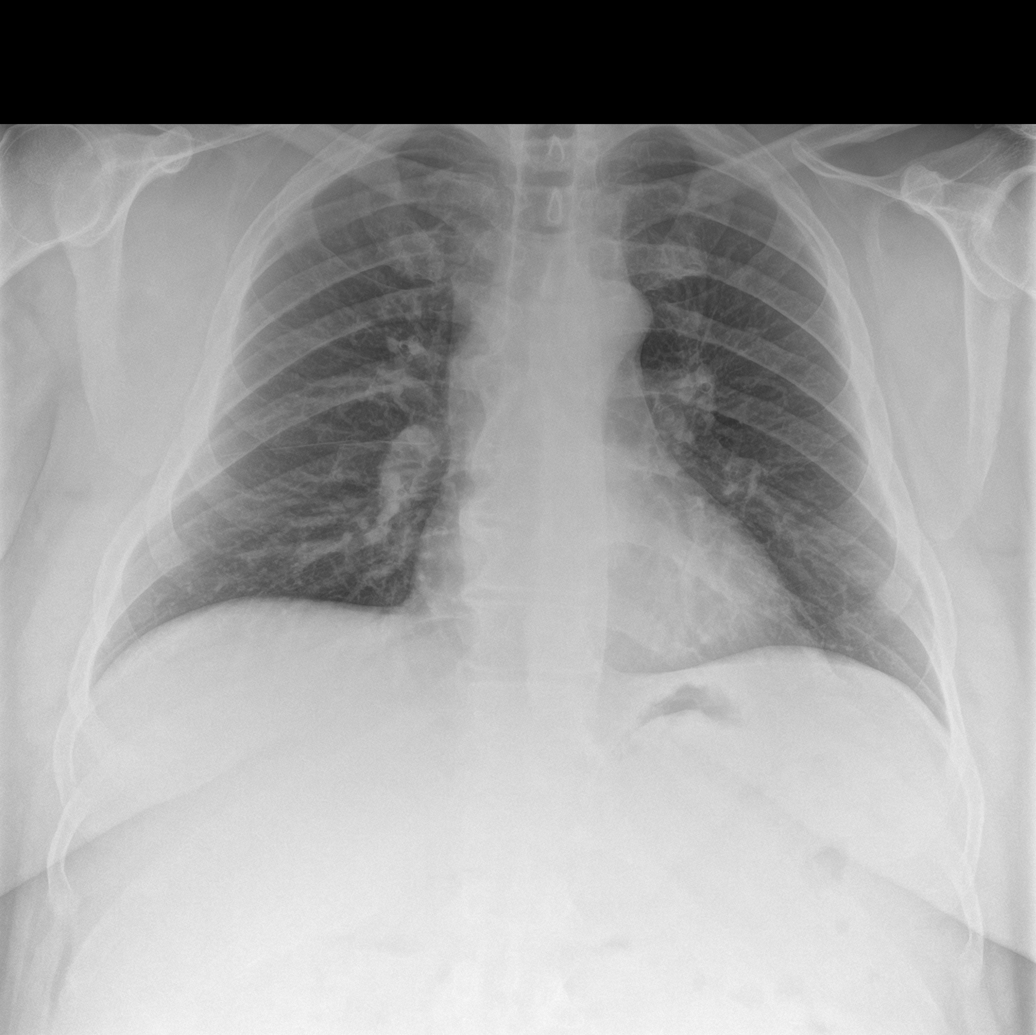

[rib pa obl (1 of 2)]
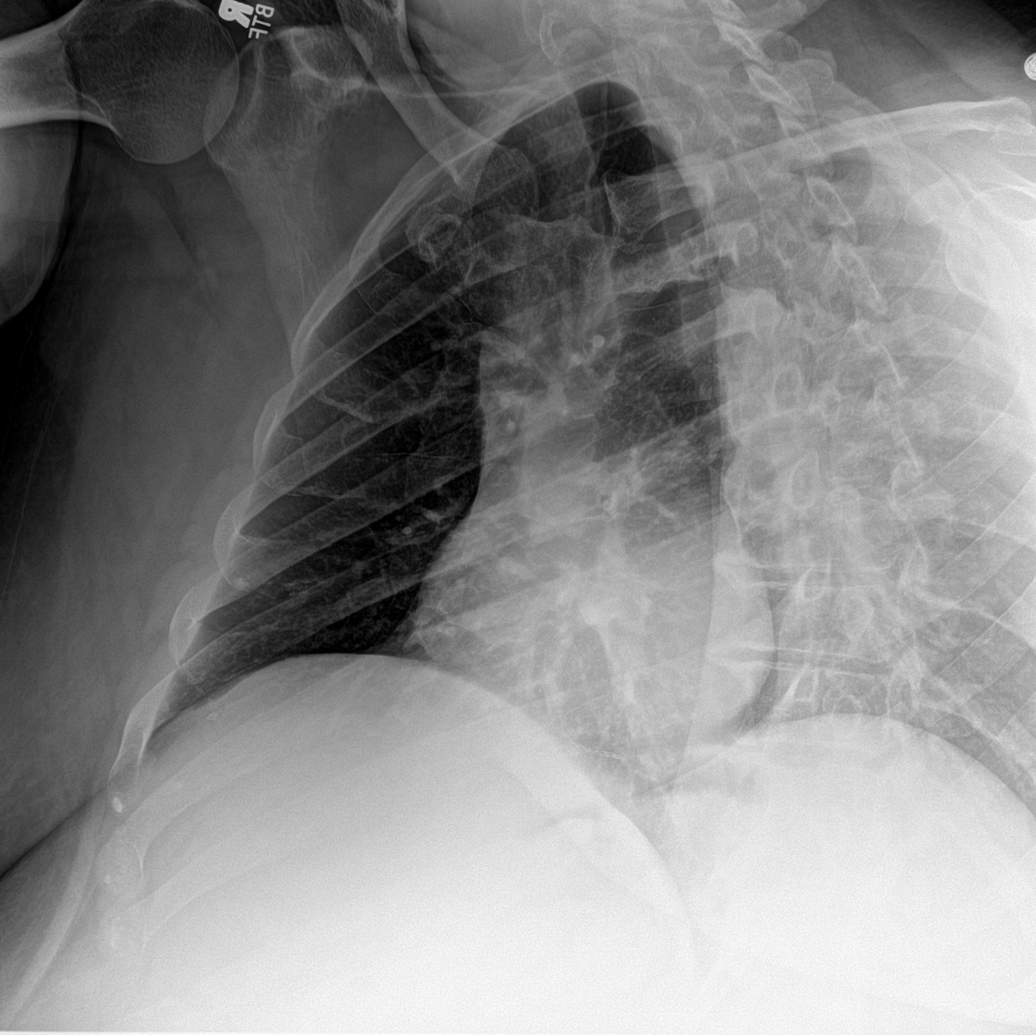

[rib pa obl (2 of 2)]
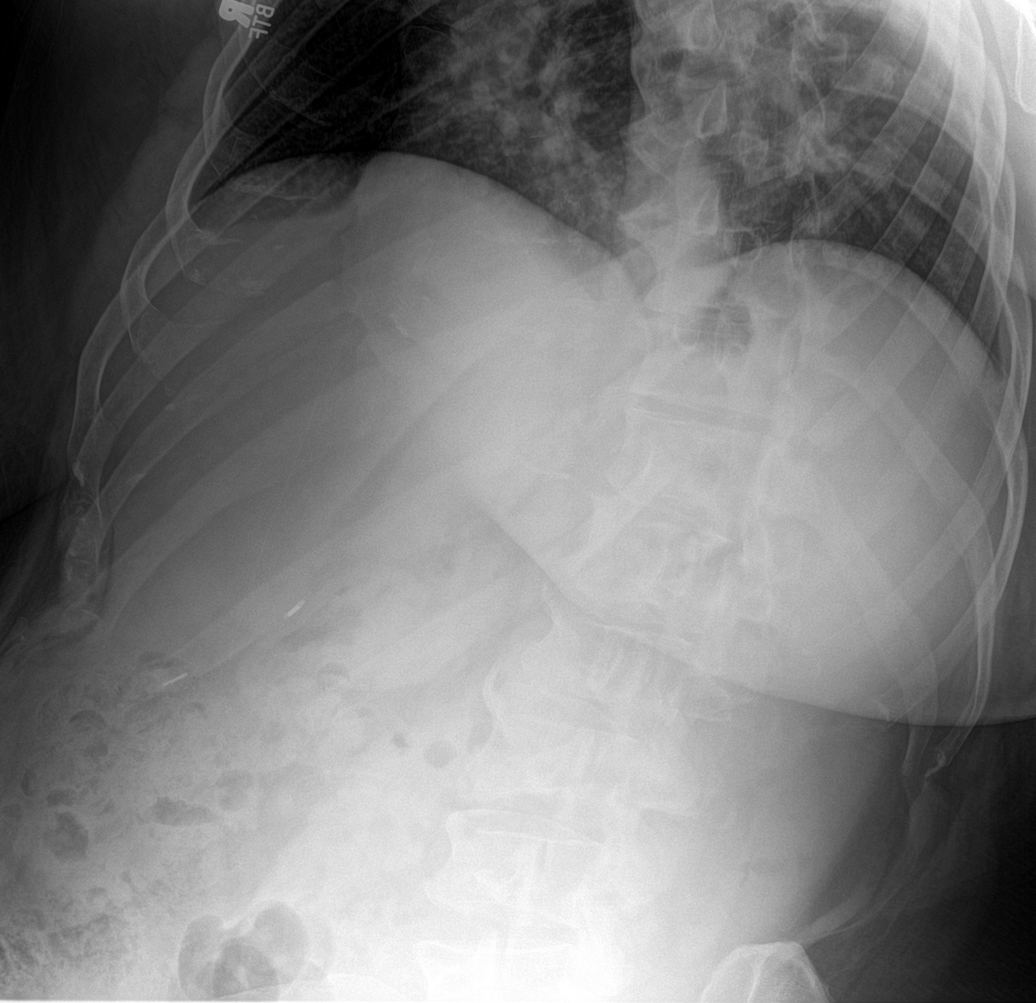

[rib pa (1 of 2)]
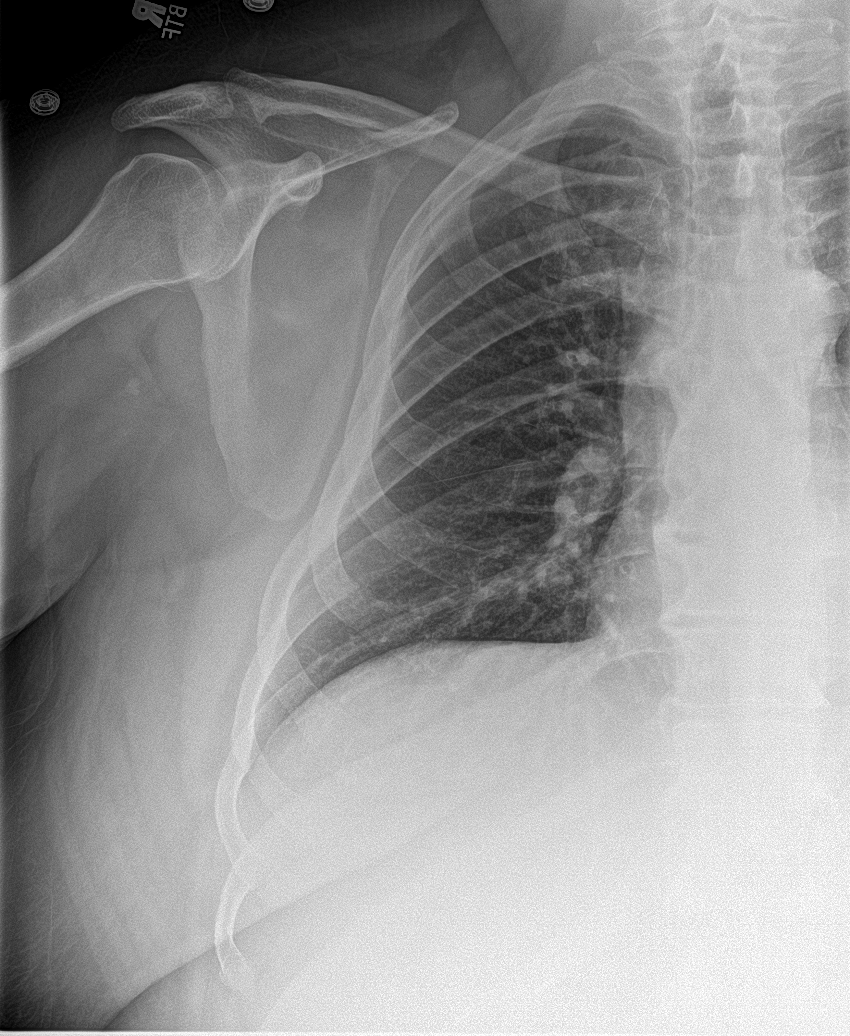

[rib pa (2 of 2)]
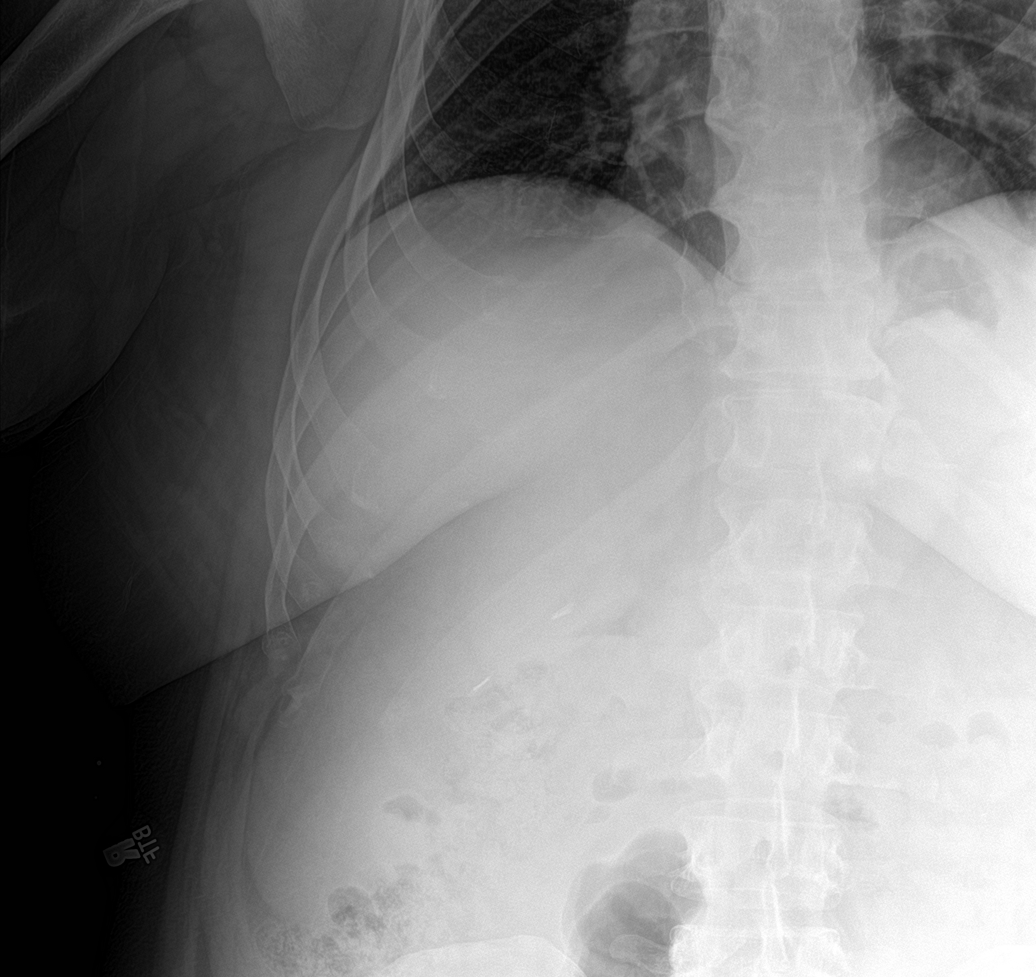

[5 of 5 positions shown; findings below may reference images not displayed]

FINDINGS: Unchanged cardiomediastinal silhouette. There is no focal airspace
consolidation. There is no large pleural effusion. There is no
visible pneumothorax. There is no acute osseous abnormality.
Specifically, there is no evidence of displaced rib fracture.
IMPRESSION: No evidence of displaced rib fracture. No acute cardiopulmonary
disease.

## 2023-06-03 ENCOUNTER — Other Ambulatory Visit: Payer: Self-pay

## 2023-06-03 ENCOUNTER — Other Ambulatory Visit: Payer: Self-pay | Admitting: Internal Medicine

## 2023-06-03 DIAGNOSIS — L239 Allergic contact dermatitis, unspecified cause: Secondary | ICD-10-CM

## 2023-06-03 DIAGNOSIS — F330 Major depressive disorder, recurrent, mild: Secondary | ICD-10-CM

## 2023-06-03 MED ORDER — SERTRALINE HCL 50 MG PO TABS: 50.0000 mg | ORAL_TABLET | Freq: Every day | ORAL | 1 refills | Status: DC

## 2023-06-17 ENCOUNTER — Other Ambulatory Visit: Payer: Self-pay

## 2023-06-17 DIAGNOSIS — Z794 Long term (current) use of insulin: Secondary | ICD-10-CM

## 2023-06-17 MED ORDER — ACCU-CHEK GUIDE VI STRP: ORAL_STRIP | 12 refills | Status: AC

## 2023-06-17 MED ORDER — ACCU-CHEK GUIDE W/DEVICE KIT: 1.0000 | PACK | Freq: Every day | 0 refills | Status: AC

## 2023-06-28 ENCOUNTER — Other Ambulatory Visit: Payer: Self-pay | Admitting: Internal Medicine

## 2023-06-28 DIAGNOSIS — E1165 Type 2 diabetes mellitus with hyperglycemia: Secondary | ICD-10-CM

## 2023-07-18 ENCOUNTER — Ambulatory Visit (INDEPENDENT_AMBULATORY_CARE_PROVIDER_SITE_OTHER): Payer: 59 | Admitting: Internal Medicine

## 2023-07-18 ENCOUNTER — Encounter: Payer: Self-pay | Admitting: Internal Medicine

## 2023-07-18 VITALS — BP 137/82 | HR 88 | Ht 66.0 in | Wt 237.2 lb

## 2023-07-18 DIAGNOSIS — F410 Panic disorder [episodic paroxysmal anxiety] without agoraphobia: Secondary | ICD-10-CM

## 2023-07-18 DIAGNOSIS — E782 Mixed hyperlipidemia: Secondary | ICD-10-CM | POA: Diagnosis not present

## 2023-07-18 DIAGNOSIS — K219 Gastro-esophageal reflux disease without esophagitis: Secondary | ICD-10-CM | POA: Insufficient documentation

## 2023-07-18 DIAGNOSIS — E1159 Type 2 diabetes mellitus with other circulatory complications: Secondary | ICD-10-CM

## 2023-07-18 DIAGNOSIS — E1165 Type 2 diabetes mellitus with hyperglycemia: Secondary | ICD-10-CM

## 2023-07-18 DIAGNOSIS — Z0001 Encounter for general adult medical examination with abnormal findings: Secondary | ICD-10-CM | POA: Diagnosis not present

## 2023-07-18 DIAGNOSIS — L239 Allergic contact dermatitis, unspecified cause: Secondary | ICD-10-CM

## 2023-07-18 DIAGNOSIS — N529 Male erectile dysfunction, unspecified: Secondary | ICD-10-CM

## 2023-07-18 DIAGNOSIS — F331 Major depressive disorder, recurrent, moderate: Secondary | ICD-10-CM

## 2023-07-18 DIAGNOSIS — Z794 Long term (current) use of insulin: Secondary | ICD-10-CM

## 2023-07-18 DIAGNOSIS — I152 Hypertension secondary to endocrine disorders: Secondary | ICD-10-CM

## 2023-07-18 MED ORDER — CLOBETASOL PROPIONATE 0.05 % EX CREA: TOPICAL_CREAM | Freq: Two times a day (BID) | CUTANEOUS | 2 refills | Status: DC

## 2023-07-18 MED ORDER — OZEMPIC (2 MG/DOSE) 8 MG/3ML ~~LOC~~ SOPN: 2.0000 mg | PEN_INJECTOR | SUBCUTANEOUS | 5 refills | Status: DC

## 2023-07-18 MED ORDER — METFORMIN HCL ER 500 MG PO TB24: 1000.0000 mg | ORAL_TABLET | Freq: Two times a day (BID) | ORAL | 1 refills | Status: DC

## 2023-07-18 MED ORDER — OMEPRAZOLE 40 MG PO CPDR: 40.0000 mg | DELAYED_RELEASE_CAPSULE | Freq: Every day | ORAL | 3 refills | Status: DC

## 2023-07-18 MED ORDER — ROSUVASTATIN CALCIUM 10 MG PO TABS
10.0000 mg | ORAL_TABLET | Freq: Every day | ORAL | 1 refills | Status: DC
Start: 2023-07-18 — End: 2023-12-02

## 2023-07-18 NOTE — Assessment & Plan Note (Signed)
Flowsheet Row Office Visit from 07/18/2023 in [facility]  PHQ-9 Total Score 15      Uncontrolled, but his school related stress and his current relationship also contributing Continue Zoloft 50 mg once daily for now, if persistent, will increase dose His anxiety is due to his current housing condition with his girlfriend Has Klonopin as needed for panic episode

## 2023-07-18 NOTE — Assessment & Plan Note (Addendum)
Physical exam as documented. Counseling done  re healthy lifestyle involving commitment to 150 minutes exercise per week, heart healthy diet, and attaining healthy weight.The importance of adequate sleep also discussed. Immunization and cancer screening needs are specifically addressed at this visit. Denies Shingrix vaccine.

## 2023-07-18 NOTE — Progress Notes (Signed)
Established Patient Office Visit  Subjective:  Patient ID: Gary Thompson, male    DOB: Jan 31, 1971  Age: 52 y.o. MRN: [id number]  CC:  Chief Complaint  Patient presents with   Annual Exam   Stress    Stress and depression worse     HPI Gary Thompson is a 52 y.o. male with past medical history of type II DM, HTN, HLD, panic disorder and OSA who presents for annual physical.  Type II DM: He takes Ozempic 2 mg qw and metformin 1000 mg twice daily. His blood glucose has been labile blood glucose, from 80s to 150s, but is overall better since stopping glipizide compared to prior.  His last HbA1c was 6.8 in 09/24.  Denies any polyuria or polyphagia.  He is trying to improve his diet.  HTN: His BP was wnl today.  He was placed on lisinopril only instead of olmesartan and amlodipine due to hypotensive spells.  Denies any headache, dizziness, chest pain, dyspnea or palpitations.  MDD and panic disorder: He has been more stressed and anxious due to his current relationship with his girlfriend and his school ([employer] course).  He stays with her and was worried that he would be told to leave if he has more arguments with her.  He had noticed improvement in his symptoms since taking Zoloft, but has had anxiety spells recently due to arguments with his girlfriend. He takes Klonopin as needed for severe anxiety.  Erectile dysfunction: He reports difficulty and maintaining erection, but has adequate response with Viagra 100 mg as needed.  Denies any dysuria, hematuria or urethral discharge currently.    Past Medical History:  Diagnosis Date   Anxiety    Arthritis    Depression    Diabetes mellitus without complication (HCC)    Hyperlipidemia    Hypertension    Sleep apnea    Vertigo     Past Surgical History:  Procedure Laterality Date   CHOLECYSTECTOMY N/A 08/11/2020   Procedure: LAPAROSCOPIC CHOLECYSTECTOMY;  Surgeon: Lucretia Roers, MD;  Location: [facility];  Service: General;   Laterality: N/A;   GALLBLADDER SURGERY     KNEE SURGERY     UMBILICAL HERNIA REPAIR N/A 08/11/2020   Procedure: HERNIA REPAIR UMBILICAL ADULT;  Surgeon: Lucretia Roers, MD;  Location: [facility];  Service: General;  Laterality: N/A;    Family History  Problem Relation Age of Onset   Cancer Mother    Hypertension Mother    Diabetes Mother    Colon polyps Father    Cancer Father    Hypertension Father    Diabetes Father    Colon cancer Paternal Grandmother        not sure of age of onset or death   Cancer Paternal Grandmother    COPD Paternal Grandfather    Esophageal cancer Neg Hx    Rectal cancer Neg Hx    Stomach cancer Neg Hx     Social History   Socioeconomic History   Marital status: Divorced    Spouse name: Not on file   Number of children: 0   Years of education: Not on file   Highest education level: GED or equivalent  Occupational History   Not on file  Tobacco Use   Smoking status: Never   Smokeless tobacco: Never  Vaping Use   Vaping status: Never Used  Substance and Sexual Activity   Alcohol use: Yes    Comment: occ; usually less than 1 per week  Drug use: Not Currently   Sexual activity: Yes  Other Topics Concern   Not on file  Social History Narrative   Not on file   Social Determinants of Health   Financial Resource Strain: Medium Risk (07/14/2023)   Overall Financial Resource Strain (CARDIA)    Difficulty of Paying Living Expenses: Somewhat hard  Food Insecurity: Food Insecurity Present (07/14/2023)   Hunger Vital Sign    Worried About Running Out of Food in the Last Year: Sometimes true    Ran Out of Food in the Last Year: Sometimes true  Transportation Needs: No Transportation Needs (07/14/2023)   PRAPARE - Administrator, Civil Service (Medical): No    Lack of Transportation (Non-Medical): No  Physical Activity: Sufficiently Active (07/14/2023)   Exercise Vital Sign    Days of Exercise per Week: 5 days    Minutes of  Exercise per Session: 150+ min  Stress: Stress Concern Present (07/14/2023)   [employer] of Occupational Health - Occupational Stress Questionnaire    Feeling of Stress : Very much  Social Connections: Socially Isolated (07/14/2023)   Social Connection and Isolation Panel [NHANES]    Frequency of Communication with Friends and Family: Once a week    Frequency of Social Gatherings with Friends and Family: Never    Attends Religious Services: Never    Database administrator or Organizations: No    Attends Engineer, structural: Not on file    Marital Status: Divorced  Catering manager Violence: Not on file    Outpatient Medications Prior to Visit  Medication Sig Dispense Refill   Blood Glucose Monitoring Suppl (ACCU-CHEK GUIDE) w/Device KIT 1 kit by Does not apply route daily. 1 kit 0   clonazePAM (KLONOPIN) 1 MG tablet Take 1 tablet (1 mg total) by mouth daily as needed. 30 tablet 0   glucose blood (ACCU-CHEK GUIDE) test strip Use as instructed 100 each 12   lisinopril (ZESTRIL) 2.5 MG tablet Take 1 tablet (2.5 mg total) by mouth daily. 30 tablet 5   sertraline (ZOLOFT) 50 MG tablet Take 1 tablet (50 mg total) by mouth daily. 60 tablet 1   sildenafil (VIAGRA) 50 MG tablet Take 1 tablet (50 mg total) by mouth daily as needed for erectile dysfunction. 10 tablet 2   triamcinolone cream (KENALOG) 0.1 % Apply 1 Application topically 2 (two) times daily. 80 g 0   clobetasol cream (TEMOVATE) 0.05 % APPLY CREAM TOPICALLY TWICE DAILY 30 g 0   metFORMIN (GLUCOPHAGE-XR) 500 MG 24 hr tablet TAKE 2 TABLETS BY MOUTH TWICE DAILY WITH A MEAL 360 tablet 0   omeprazole (PRILOSEC) 40 MG capsule Take 1 capsule by mouth once daily 90 capsule 0   OZEMPIC, 2 MG/DOSE, 8 MG/3ML SOPN INJECT 2 MG SUBCUTANEOUSLY ONCE A WEEK AS DIRECTED 3 mL 0   rosuvastatin (CRESTOR) 10 MG tablet Take 1 tablet by mouth once daily 90 tablet 0   No facility-administered medications prior to visit.    Allergies   Allergen Reactions   Dm-Doxylamine-Acetaminophen Hives and Shortness Of Breath    "Nyquill" and "Vicks 44-D" Hives States difficulty breathing    Dextromethorphan    Vicks Dayquil-Nyquil Cld & Flu [Pe-Dm-Apap & Doxylamin-Dm-Apap] Other (See Comments)    "overly sedated feeling'    ROS Review of Systems  Constitutional:  Negative for chills and fever.  HENT:  Negative for congestion and sore throat.   Eyes:  Negative for pain and discharge.  Respiratory:  Negative  for cough and shortness of breath.   Cardiovascular:  Negative for chest pain and palpitations.  Gastrointestinal:  Negative for constipation, diarrhea, nausea and vomiting.  Endocrine: Negative for polydipsia and polyuria.  Genitourinary:  Negative for dysuria and hematuria.  Musculoskeletal:  Negative for neck pain and neck stiffness.  Skin:  Positive for rash.  Neurological:  Negative for dizziness, weakness, numbness and headaches.  Psychiatric/Behavioral:  Positive for dysphoric mood and sleep disturbance. Negative for agitation and behavioral problems. The patient is nervous/anxious.       Objective:    Physical Exam Vitals reviewed.  Constitutional:      General: He is not in acute distress.    Appearance: He is obese. He is not diaphoretic.  HENT:     Head: Normocephalic and atraumatic.     Nose: Nose normal.     Mouth/Throat:     Mouth: Mucous membranes are moist.  Eyes:     General: No scleral icterus.    Extraocular Movements: Extraocular movements intact.  Cardiovascular:     Rate and Rhythm: Normal rate and regular rhythm.     Heart sounds: Normal heart sounds. No murmur heard. Pulmonary:     Breath sounds: Normal breath sounds. No wheezing or rales.  Abdominal:     Palpations: Abdomen is soft.     Tenderness: There is no abdominal tenderness.  Musculoskeletal:     Cervical back: Neck supple. No tenderness.     Right lower leg: No edema.     Left lower leg: No edema.  Skin:    General:  Skin is warm.     Findings: Rash (Erythematous patches over bilateral shin area) present.  Neurological:     General: No focal deficit present.     Mental Status: He is alert and oriented to person, place, and time.     Cranial Nerves: No cranial nerve deficit.     Sensory: No sensory deficit.     Motor: No weakness.  Psychiatric:        Mood and Affect: Mood normal.        Behavior: Behavior normal.     BP 137/82 (BP Location: Left Arm, Patient Position: Sitting, Cuff Size: Large)   Pulse 88   Ht 5\' 6"  (1.676 m)   Wt 237 lb 3.2 oz (107.6 kg)   SpO2 98%   BMI 38.29 kg/m  Wt Readings from Last 3 Encounters:  07/18/23 237 lb 3.2 oz (107.6 kg)  05/28/23 247 lb 6.4 oz (112.2 kg)  05/02/23 255 lb 12.8 oz (116 kg)    No results found for: "TSH" Lab Results  Component Value Date   WBC 11.6 (H) 01/24/2023   HGB 15.0 01/24/2023   HCT 45.3 01/24/2023   MCV 84 01/24/2023   PLT 237 01/24/2023   Lab Results  Component Value Date   NA 140 04/30/2023   K 4.5 04/30/2023   CO2 26 04/30/2023   GLUCOSE 132 (H) 04/30/2023   BUN 16 04/30/2023   CREATININE 1.15 04/30/2023   BILITOT 0.4 04/30/2023   ALKPHOS 79 04/30/2023   AST 10 04/30/2023   ALT 12 04/30/2023   PROT 6.6 04/30/2023   ALBUMIN 4.1 04/30/2023   CALCIUM 9.3 04/30/2023   ANIONGAP 10 08/17/2020   EGFR 77 04/30/2023   Lab Results  Component Value Date   CHOL 91 (L) 04/30/2023   Lab Results  Component Value Date   HDL 29 (L) 04/30/2023   Lab Results  Component Value Date  LDLCALC 42 04/30/2023   Lab Results  Component Value Date   TRIG 109 04/30/2023   Lab Results  Component Value Date   CHOLHDL 3.1 04/30/2023   Lab Results  Component Value Date   HGBA1C 6.8 (H) 04/30/2023      Assessment & Plan:   Problem List Items Addressed This Visit       Cardiovascular and Mediastinum   Hypertension associated with diabetes (HCC)    BP Readings from Last 1 Encounters:  07/18/23 137/82    Well-controlled DCed amlodipine and olmesartan due to hypotension in the last visit On Lisinopril 2.5 mg considering h/o DM Counseled for compliance with the medications Advised DASH diet and moderate exercise/walking, at least 150 mins/week      Relevant Medications   rosuvastatin (CRESTOR) 10 MG tablet   Semaglutide, 2 MG/DOSE, (OZEMPIC, 2 MG/DOSE,) 8 MG/3ML SOPN   metFORMIN (GLUCOPHAGE-XR) 500 MG 24 hr tablet     Digestive   Gastroesophageal reflux disease without esophagitis    Well-controlled with Omeprazole      Relevant Medications   omeprazole (PRILOSEC) 40 MG capsule     Endocrine   Type 2 diabetes mellitus with hyperglycemia (HCC)    Lab Results  Component Value Date   HGBA1C 6.8 (H) 04/30/2023   Well-controlled now Associated with HTN and HLD Used to be on insulin in the past On Metformin 1000 mg BID On Ozempic 2 mg qw now Advised to follow diabetic diet On ARB and statin F/u CMP and lipid panel Diabetic eye exam: Advised to follow up with Ophthalmology for diabetic eye exam      Relevant Medications   rosuvastatin (CRESTOR) 10 MG tablet   Semaglutide, 2 MG/DOSE, (OZEMPIC, 2 MG/DOSE,) 8 MG/3ML SOPN   metFORMIN (GLUCOPHAGE-XR) 500 MG 24 hr tablet   Other Relevant Orders   Urine Microalbumin w/creat. ratio     Musculoskeletal and Integument   Allergic dermatitis    Has tried Kenalog cream, improved the rash over arms and trunk Has persistent rash over bilateral legs Has had improvement with clobetasol cream Avoid oral steroids due to history of uncontrolled type II DM Apply lotion or moisturizer for dry skin      Relevant Medications   clobetasol cream (TEMOVATE) 0.05 %     Other   Panic disorder    Takes Clonazepam PRN      Encounter for general adult medical examination with abnormal findings - Primary    Physical exam as documented. Counseling done  re healthy lifestyle involving commitment to 150 minutes exercise per week, heart healthy  diet, and attaining healthy weight.The importance of adequate sleep also discussed. Immunization and cancer screening needs are specifically addressed at this visit. Denies Shingrix vaccine.      Mixed hyperlipidemia    Checked lipid profile On Crestor      Relevant Medications   rosuvastatin (CRESTOR) 10 MG tablet   Erectile dysfunction    Advised to take Viagra 2 tablets of 50 mg as needed      Moderate episode of recurrent major depressive disorder (HCC)    Flowsheet Row Office Visit from 07/18/2023 in [facility]  PHQ-9 Total Score 15      Uncontrolled, but his school related stress and his current relationship also contributing Continue Zoloft 50 mg once daily for now, if persistent, will increase dose His anxiety is due to his current housing condition with his girlfriend Has Klonopin as needed for panic episode  Meds ordered this encounter  Medications   rosuvastatin (CRESTOR) 10 MG tablet    Sig: Take 1 tablet (10 mg total) by mouth daily.    Dispense:  90 tablet    Refill:  1   Semaglutide, 2 MG/DOSE, (OZEMPIC, 2 MG/DOSE,) 8 MG/3ML SOPN    Sig: Inject 2 mg into the skin every 7 (seven) days.    Dispense:  3 mL    Refill:  5   omeprazole (PRILOSEC) 40 MG capsule    Sig: Take 1 capsule (40 mg total) by mouth daily.    Dispense:  90 capsule    Refill:  3   metFORMIN (GLUCOPHAGE-XR) 500 MG 24 hr tablet    Sig: Take 2 tablets (1,000 mg total) by mouth 2 (two) times daily with a meal.    Dispense:  360 tablet    Refill:  1   clobetasol cream (TEMOVATE) 0.05 %    Sig: Apply topically 2 (two) times daily.    Dispense:  30 g    Refill:  2    Follow-up: Return in about 2 months (around 09/17/2023) for DM and MDD.    Anabel Halon, MD

## 2023-07-18 NOTE — Assessment & Plan Note (Signed)
Has tried Kenalog cream, improved the rash over arms and trunk Has persistent rash over bilateral legs Has had improvement with clobetasol cream Avoid oral steroids due to history of uncontrolled type II DM Apply lotion or moisturizer for dry skin

## 2023-07-18 NOTE — Assessment & Plan Note (Signed)
Checked lipid profile On Crestor

## 2023-07-18 NOTE — Assessment & Plan Note (Signed)
Takes Clonazepam PRN

## 2023-07-18 NOTE — Patient Instructions (Addendum)
Please continue to take medications as prescribed. ? ?Please continue to follow low carb diet and perform moderate exercise/walking at least 150 mins/week. ?

## 2023-07-18 NOTE — Assessment & Plan Note (Signed)
Well-controlled with Omeprazole 

## 2023-07-18 NOTE — Assessment & Plan Note (Signed)
BP Readings from Last 1 Encounters:  07/18/23 137/82   Well-controlled DCed amlodipine and olmesartan due to hypotension in the last visit On Lisinopril 2.5 mg considering h/o DM Counseled for compliance with the medications Advised DASH diet and moderate exercise/walking, at least 150 mins/week

## 2023-07-18 NOTE — Assessment & Plan Note (Signed)
 Advised to take Viagra 2 tablets of 50 mg as needed

## 2023-07-18 NOTE — Assessment & Plan Note (Addendum)
Lab Results  Component Value Date   HGBA1C 6.8 (H) 04/30/2023   Well-controlled now Associated with HTN and HLD Used to be on insulin in the past On Metformin 1000 mg BID On Ozempic 2 mg qw now Advised to follow diabetic diet On ARB and statin F/u CMP and lipid panel Diabetic eye exam: Advised to follow up with Ophthalmology for diabetic eye exam

## 2023-07-21 LAB — MICROALBUMIN / CREATININE URINE RATIO
Creatinine, Urine: 250.6 mg/dL
Microalb/Creat Ratio: 10 mg/g{creat} (ref 0–29)
Microalbumin, Urine: 26.1 ug/mL

## 2023-08-13 ENCOUNTER — Encounter: Payer: Self-pay | Admitting: Internal Medicine

## 2023-09-01 ENCOUNTER — Other Ambulatory Visit: Payer: Self-pay | Admitting: Internal Medicine

## 2023-09-01 DIAGNOSIS — F410 Panic disorder [episodic paroxysmal anxiety] without agoraphobia: Secondary | ICD-10-CM

## 2023-09-01 DIAGNOSIS — F330 Major depressive disorder, recurrent, mild: Secondary | ICD-10-CM

## 2023-09-06 ENCOUNTER — Encounter (INDEPENDENT_AMBULATORY_CARE_PROVIDER_SITE_OTHER): Payer: 59 | Admitting: Ophthalmology

## 2023-09-23 ENCOUNTER — Encounter: Payer: Self-pay | Admitting: Internal Medicine

## 2023-09-23 ENCOUNTER — Ambulatory Visit: Payer: 59 | Admitting: Internal Medicine

## 2023-09-23 ENCOUNTER — Ambulatory Visit (HOSPITAL_COMMUNITY)
Admission: RE | Admit: 2023-09-23 | Discharge: 2023-09-23 | Disposition: A | Discharge status: Home / Self Care | Payer: 59 | Source: Ambulatory Visit | Attending: Internal Medicine | Admitting: Internal Medicine

## 2023-09-23 VITALS — BP 124/77 | HR 77 | Ht 66.0 in | Wt 242.2 lb

## 2023-09-23 DIAGNOSIS — M5412 Radiculopathy, cervical region: Secondary | ICD-10-CM

## 2023-09-23 DIAGNOSIS — Z23 Encounter for immunization: Secondary | ICD-10-CM

## 2023-09-23 DIAGNOSIS — Z794 Long term (current) use of insulin: Secondary | ICD-10-CM

## 2023-09-23 DIAGNOSIS — E1159 Type 2 diabetes mellitus with other circulatory complications: Secondary | ICD-10-CM | POA: Diagnosis not present

## 2023-09-23 DIAGNOSIS — F410 Panic disorder [episodic paroxysmal anxiety] without agoraphobia: Secondary | ICD-10-CM

## 2023-09-23 DIAGNOSIS — E782 Mixed hyperlipidemia: Secondary | ICD-10-CM

## 2023-09-23 DIAGNOSIS — F331 Major depressive disorder, recurrent, moderate: Secondary | ICD-10-CM | POA: Diagnosis not present

## 2023-09-23 DIAGNOSIS — I152 Hypertension secondary to endocrine disorders: Secondary | ICD-10-CM

## 2023-09-23 DIAGNOSIS — E1165 Type 2 diabetes mellitus with hyperglycemia: Secondary | ICD-10-CM | POA: Diagnosis not present

## 2023-09-23 MED ORDER — SERTRALINE HCL 100 MG PO TABS: 100.0000 mg | ORAL_TABLET | Freq: Every day | ORAL | 1 refills | Status: DC

## 2023-09-23 MED ORDER — LISINOPRIL 2.5 MG PO TABS: 2.5000 mg | ORAL_TABLET | Freq: Every day | ORAL | 5 refills | Status: DC

## 2023-09-23 MED ORDER — CYCLOBENZAPRINE HCL 5 MG PO TABS: 5.0000 mg | ORAL_TABLET | Freq: Two times a day (BID) | ORAL | 1 refills | Status: DC | PRN

## 2023-09-23 NOTE — Assessment & Plan Note (Signed)
Takes Clonazepam PRN

## 2023-09-23 NOTE — Patient Instructions (Signed)
Please start taking Zoloft 100 mg once daily instead of 50 mg.  Please take Flexeril as needed for neck muscle spasms.  Please continue to take other medications as prescribed.  Please continue to follow low carb diet and perform moderate exercise/walking at least 150 mins/week.

## 2023-09-23 NOTE — Assessment & Plan Note (Signed)
BP Readings from Last 1 Encounters:  09/23/23 124/77   Well-controlled DCed amlodipine and olmesartan due to hypotension in the last visit On Lisinopril 2.5 mg considering h/o DM Counseled for compliance with the medications Advised DASH diet and moderate exercise/walking, at least 150 mins/week

## 2023-09-23 NOTE — Progress Notes (Addendum)
Established Patient Office Visit  Subjective:  Patient ID: Gary Thompson, male    DOB: 11-11-1970  Age: 53 y.o. MRN: [id number]  CC:  Chief Complaint  Patient presents with  . Care Management    2 month f/u, pt reports pain in his neck radiating to his shoulder. Had side effect with metformin d/c taking it one month ago. Would like to discuss dose of depression medication.     HPI Gary Thompson is a 53 y.o. male with past medical history of type II DM, HTN, HLD, panic disorder and OSA who presents for annual physical.  Type II DM: He takes Ozempic 2 mg qw.  He has stopped taking metformin as he was having diarrhea and abdominal pain with it. His blood glucose has been overall better controlled, from 80s to 140s.  His last HbA1c was 6.8 in 09/24.  Denies any polyuria or polyphagia.  He is trying to improve his diet.  HTN: His BP was wnl today.  He was placed on lisinopril only instead of olmesartan and amlodipine due to hypotensive spells.  Denies any headache, dizziness, chest pain, dyspnea or palpitations.  MDD and panic disorder: He has been more stressed and anxious due to his current relationship with his girlfriend and his school (HV[employer] course).  He stays with her and was worried that he would be told to leave if he has more arguments with her.  He had noticed improvement in his symptoms since taking Zoloft, but has had anxiety spells recently due to arguments with his girlfriend and stress from his school. He takes Klonopin as needed for severe anxiety.  Erectile dysfunction: He reports difficulty and maintaining erection, but has adequate response with Viagra 100 mg as needed.  Denies any dysuria, hematuria or urethral discharge currently.  Neck pain: He reports chronic, intermittent neck pain, radiating towards left shoulder, associated with numbness and tingling of left LE.  He has tried a Scientist, product/process development powder with some relief.  Past Medical History:  Diagnosis Date  . Anxiety   .  Arthritis   . Depression   . Diabetes mellitus without complication (HCC)   . Hyperlipidemia   . Hypertension   . Sleep apnea   . Vertigo     Past Surgical History:  Procedure Laterality Date  . CHOLECYSTECTOMY N/A 08/11/2020   Procedure: LAPAROSCOPIC CHOLECYSTECTOMY;  Surgeon: Lucretia Roers, MD;  Location: [facility];  Service: General;  Laterality: N/A;  . GALLBLADDER SURGERY    . KNEE SURGERY    . UMBILICAL HERNIA REPAIR N/A 08/11/2020   Procedure: HERNIA REPAIR UMBILICAL ADULT;  Surgeon: Lucretia Roers, MD;  Location: [facility];  Service: General;  Laterality: N/A;    Family History  Problem Relation Age of Onset  . Cancer Mother   . Hypertension Mother   . Diabetes Mother   . Colon polyps Father   . Cancer Father   . Hypertension Father   . Diabetes Father   . Colon cancer Paternal Grandmother        not sure of age of onset or death  . Cancer Paternal Grandmother   . COPD Paternal Grandfather   . Esophageal cancer Neg Hx   . Rectal cancer Neg Hx   . Stomach cancer Neg Hx     Social History   Socioeconomic History  . Marital status: Divorced    Spouse name: Not on file  . Number of children: 0  . Years of education: Not on file  .  Highest education level: GED or equivalent  Occupational History  . Not on file  Tobacco Use  . Smoking status: Never  . Smokeless tobacco: Never  Vaping Use  . Vaping status: Never Used  Substance and Sexual Activity  . Alcohol use: Yes    Comment: occ; usually less than 1 per week  . Drug use: Not Currently  . Sexual activity: Yes  Other Topics Concern  . Not on file  Social History Narrative  . Not on file   Social Drivers of Health   Financial Resource Strain: High Risk (09/19/2023)   Overall Financial Resource Strain (CARDIA)   . Difficulty of Paying Living Expenses: Very hard  Food Insecurity: Food Insecurity Present (09/19/2023)   Hunger Vital Sign   . Worried About Programme researcher, broadcasting/film/video in the Last Year:  Sometimes true   . Ran Out of Food in the Last Year: Sometimes true  Transportation Needs: No Transportation Needs (09/19/2023)   PRAPARE - Transportation   . Lack of Transportation (Medical): No   . Lack of Transportation (Non-Medical): No  Physical Activity: Sufficiently Active (07/14/2023)   Exercise Vital Sign   . Days of Exercise per Week: 5 days   . Minutes of Exercise per Session: 150+ min  Stress: Stress Concern Present (09/19/2023)   [employer] of Occupational Health - Occupational Stress Questionnaire   . Feeling of Stress : Very much  Social Connections: Socially Isolated (09/19/2023)   Social Connection and Isolation Panel [NHANES]   . Frequency of Communication with Friends and Family: More than three times a week   . Frequency of Social Gatherings with Friends and Family: Once a week   . Attends Religious Services: Never   . Active Member of Clubs or Organizations: No   . Attends Banker Meetings: Not on file   . Marital Status: Divorced  Catering manager Violence: Not on file    Outpatient Medications Prior to Visit  Medication Sig Dispense Refill  . Blood Glucose Monitoring Suppl (ACCU-CHEK GUIDE) w/Device KIT 1 kit by Does not apply route daily. 1 kit 0  . clobetasol cream (TEMOVATE) 0.05 % Apply topically 2 (two) times daily. 30 g 2  . clonazePAM (KLONOPIN) 1 MG tablet TAKE 1 TABLET BY MOUTH ONCE DAILY AS NEEDED 30 tablet 0  . glucose blood (ACCU-CHEK GUIDE) test strip Use as instructed 100 each 12  . omeprazole (PRILOSEC) 40 MG capsule Take 1 capsule (40 mg total) by mouth daily. 90 capsule 3  . rosuvastatin (CRESTOR) 10 MG tablet Take 1 tablet (10 mg total) by mouth daily. 90 tablet 1  . Semaglutide, 2 MG/DOSE, (OZEMPIC, 2 MG/DOSE,) 8 MG/3ML SOPN Inject 2 mg into the skin every 7 (seven) days. 3 mL 5  . sildenafil (VIAGRA) 50 MG tablet Take 1 tablet (50 mg total) by mouth daily as needed for erectile dysfunction. 10 tablet 2  . triamcinolone  cream (KENALOG) 0.1 % Apply 1 Application topically 2 (two) times daily. 80 g 0  . lisinopril (ZESTRIL) 2.5 MG tablet Take 1 tablet (2.5 mg total) by mouth daily. 30 tablet 5  . sertraline (ZOLOFT) 50 MG tablet Take 1 tablet by mouth once daily 90 tablet 0  . metFORMIN (GLUCOPHAGE-XR) 500 MG 24 hr tablet Take 2 tablets (1,000 mg total) by mouth 2 (two) times daily with a meal. (Patient not taking: Reported on 09/23/2023) 360 tablet 1   No facility-administered medications prior to visit.    Allergies  Allergen Reactions  .  Dm-Doxylamine-Acetaminophen Hives and Shortness Of Breath    "Nyquill" and "Vicks 44-D" Hives States difficulty breathing   . Dextromethorphan   . Vicks Dayquil-Nyquil Cld & Flu [Pe-Dm-Apap & Doxylamin-Dm-Apap] Other (See Comments)    "overly sedated feeling'    ROS Review of Systems  Constitutional:  Negative for chills and fever.  HENT:  Negative for congestion and sore throat.   Eyes:  Negative for pain and discharge.  Respiratory:  Negative for cough and shortness of breath.   Cardiovascular:  Negative for chest pain and palpitations.  Gastrointestinal:  Negative for diarrhea, nausea and vomiting.  Endocrine: Negative for polydipsia and polyuria.  Genitourinary:  Negative for dysuria and hematuria.  Musculoskeletal:  Positive for neck pain. Negative for neck stiffness.  Skin:  Positive for rash.  Neurological:  Negative for dizziness, weakness, numbness and headaches.  Psychiatric/Behavioral:  Positive for sleep disturbance. Negative for agitation and behavioral problems. The patient is nervous/anxious.       Objective:    Physical Exam Vitals reviewed.  Constitutional:      General: He is not in acute distress.    Appearance: He is obese. He is not diaphoretic.  HENT:     Head: Normocephalic and atraumatic.     Nose: Nose normal.     Mouth/Throat:     Mouth: Mucous membranes are moist.  Eyes:     General: No scleral icterus.    Extraocular  Movements: Extraocular movements intact.  Cardiovascular:     Rate and Rhythm: Normal rate and regular rhythm.     Heart sounds: Normal heart sounds. No murmur heard. Pulmonary:     Breath sounds: Normal breath sounds. No wheezing or rales.  Musculoskeletal:     Cervical back: Neck supple. Tenderness present.     Right lower leg: No edema.     Left lower leg: No edema.  Skin:    General: Skin is warm.     Findings: Rash (Erythematous patches over bilateral shin area) present.  Neurological:     General: No focal deficit present.     Mental Status: He is alert and oriented to person, place, and time.     Cranial Nerves: No cranial nerve deficit.     Sensory: No sensory deficit.     Motor: No weakness.  Psychiatric:        Mood and Affect: Mood normal.        Behavior: Behavior normal.    BP 124/77   Pulse 77   Ht 5\' 6"  (1.676 m)   Wt 242 lb 3.2 oz (109.9 kg)   SpO2 98%   BMI 39.09 kg/m  Wt Readings from Last 3 Encounters:  09/23/23 242 lb 3.2 oz (109.9 kg)  07/18/23 237 lb 3.2 oz (107.6 kg)  05/28/23 247 lb 6.4 oz (112.2 kg)    No results found for: "TSH" Lab Results  Component Value Date   WBC 11.6 (H) 01/24/2023   HGB 15.0 01/24/2023   HCT 45.3 01/24/2023   MCV 84 01/24/2023   PLT 237 01/24/2023   Lab Results  Component Value Date   NA 140 04/30/2023   K 4.5 04/30/2023   CO2 26 04/30/2023   GLUCOSE 132 (H) 04/30/2023   BUN 16 04/30/2023   CREATININE 1.15 04/30/2023   BILITOT 0.4 04/30/2023   ALKPHOS 79 04/30/2023   AST 10 04/30/2023   ALT 12 04/30/2023   PROT 6.6 04/30/2023   ALBUMIN 4.1 04/30/2023   CALCIUM 9.3 04/30/2023  ANIONGAP 10 08/17/2020   EGFR 77 04/30/2023   Lab Results  Component Value Date   CHOL 91 (L) 04/30/2023   Lab Results  Component Value Date   HDL 29 (L) 04/30/2023   Lab Results  Component Value Date   LDLCALC 42 04/30/2023   Lab Results  Component Value Date   TRIG 109 04/30/2023   Lab Results  Component Value  Date   CHOLHDL 3.1 04/30/2023   Lab Results  Component Value Date   HGBA1C 6.8 (H) 04/30/2023      Assessment & Plan:   Problem List Items Addressed This Visit       Cardiovascular and Mediastinum   Hypertension associated with diabetes (HCC)   BP Readings from Last 1 Encounters:  09/23/23 124/77   Well-controlled DCed amlodipine and olmesartan due to hypotension in the last visit On Lisinopril 2.5 mg considering h/o DM Counseled for compliance with the medications Advised DASH diet and moderate exercise/walking, at least 150 mins/week      Relevant Medications   lisinopril (ZESTRIL) 2.5 MG tablet     Endocrine   Type 2 diabetes mellitus with hyperglycemia (HCC) - Primary   Lab Results  Component Value Date   HGBA1C 6.8 (H) 04/30/2023   Well-controlled now Associated with HTN and HLD On Ozempic 2 mg qw now DCed Metformin 1000 mg BID due to GI side effects Used to be on insulin in the past Advised to follow diabetic diet On ARB and statin F/u CMP and lipid panel Diabetic eye exam: Advised to follow up with Ophthalmology for diabetic eye exam      Relevant Medications   lisinopril (ZESTRIL) 2.5 MG tablet   Other Relevant Orders   CMP14+EGFR   Hemoglobin A1c     Nervous and Auditory   Cervical radiculopathy   Neck pain with radicular symptoms likely due to cervical radiculopathy Check x-ray of cervical spine Avoid oral steroids due to DM Ibuprofen as needed for pain Flexeril as needed for muscle spasms Heating pad and/or massage PRN      Relevant Medications   sertraline (ZOLOFT) 100 MG tablet   cyclobenzaprine (FLEXERIL) 5 MG tablet   Other Relevant Orders   DG Cervical Spine Complete     Other   Panic disorder   Takes Clonazepam PRN      Relevant Medications   sertraline (ZOLOFT) 100 MG tablet   Mixed hyperlipidemia   Checked lipid profile On Crestor      Relevant Medications   lisinopril (ZESTRIL) 2.5 MG tablet   Moderate episode of  recurrent major depressive disorder (HCC)   Uncontrolled, but his school related stress and his current relationship also contributing Increased dose of Zoloft to 100 mg once daily His anxiety is due to his current housing condition with his girlfriend Has Klonopin as needed for panic episode      Relevant Medications   sertraline (ZOLOFT) 100 MG tablet   Other Visit Diagnoses       Encounter for immunization       Relevant Orders   Varicella-zoster vaccine IM (Completed)        Meds ordered this encounter  Medications  . sertraline (ZOLOFT) 100 MG tablet    Sig: Take 1 tablet (100 mg total) by mouth daily.    Dispense:  90 tablet    Refill:  1    Dose change - 09/23/23  . lisinopril (ZESTRIL) 2.5 MG tablet    Sig: Take 1 tablet (2.5 mg total)  by mouth daily.    Dispense:  30 tablet    Refill:  5  . cyclobenzaprine (FLEXERIL) 5 MG tablet    Sig: Take 1 tablet (5 mg total) by mouth 2 (two) times daily as needed for muscle spasms.    Dispense:  30 tablet    Refill:  1    Follow-up: Return in about 3 months (around 12/22/2023) for DM, Shingrix #2.    Anabel Halon, MD

## 2023-09-23 NOTE — Assessment & Plan Note (Signed)
Neck pain with radicular symptoms likely due to cervical radiculopathy Check x-ray of cervical spine Avoid oral steroids due to DM Ibuprofen as needed for pain Flexeril as needed for muscle spasms Heating pad and/or massage PRN

## 2023-09-23 NOTE — Assessment & Plan Note (Signed)
Lab Results  Component Value Date   HGBA1C 6.8 (H) 04/30/2023   Well-controlled now Associated with HTN and HLD On Ozempic 2 mg qw now DCed Metformin 1000 mg BID due to GI side effects Used to be on insulin in the past Advised to follow diabetic diet On ARB and statin F/u CMP and lipid panel Diabetic eye exam: Advised to follow up with Ophthalmology for diabetic eye exam

## 2023-09-23 NOTE — Assessment & Plan Note (Signed)
Checked lipid profile On Crestor

## 2023-09-23 NOTE — Assessment & Plan Note (Addendum)
Uncontrolled, but his school related stress and his current relationship also contributing Increased dose of Zoloft to 100 mg once daily His anxiety is due to his current housing condition with his girlfriend Has Klonopin as needed for panic episode

## 2023-09-24 ENCOUNTER — Encounter: Payer: Self-pay | Admitting: Internal Medicine

## 2023-09-24 LAB — CMP14+EGFR
ALT: 10 [IU]/L (ref 0–44)
AST: 7 [IU]/L (ref 0–40)
Albumin: 3.8 g/dL (ref 3.8–4.9)
Alkaline Phosphatase: 79 [IU]/L (ref 44–121)
BUN/Creatinine Ratio: 9 (ref 9–20)
BUN: 13 mg/dL (ref 6–24)
Bilirubin Total: 0.3 mg/dL (ref 0.0–1.2)
CO2: 22 mmol/L (ref 20–29)
Calcium: 8.7 mg/dL (ref 8.7–10.2)
Chloride: 103 mmol/L (ref 96–106)
Creatinine, Ser: 1.38 mg/dL — ABNORMAL HIGH (ref 0.76–1.27)
Globulin, Total: 2.5 g/dL (ref 1.5–4.5)
Glucose: 154 mg/dL — ABNORMAL HIGH (ref 70–99)
Potassium: 4.1 mmol/L (ref 3.5–5.2)
Sodium: 141 mmol/L (ref 134–144)
Total Protein: 6.3 g/dL (ref 6.0–8.5)
eGFR: 62 mL/min/{1.73_m2} (ref >59)

## 2023-09-24 LAB — HEMOGLOBIN A1C
Est. average glucose Bld gHb Est-mCnc: 120 mg/dL
Hgb A1c MFr Bld: 5.8 % — ABNORMAL HIGH (ref 4.8–5.6)

## 2023-09-27 ENCOUNTER — Encounter (INDEPENDENT_AMBULATORY_CARE_PROVIDER_SITE_OTHER): Payer: 59 | Admitting: Ophthalmology

## 2023-09-30 ENCOUNTER — Encounter: Payer: Self-pay | Admitting: Internal Medicine

## 2023-10-04 ENCOUNTER — Encounter (INDEPENDENT_AMBULATORY_CARE_PROVIDER_SITE_OTHER): Payer: 59 | Admitting: Ophthalmology

## 2023-10-04 DIAGNOSIS — H43813 Vitreous degeneration, bilateral: Secondary | ICD-10-CM

## 2023-10-04 DIAGNOSIS — Z7985 Long-term (current) use of injectable non-insulin antidiabetic drugs: Secondary | ICD-10-CM

## 2023-10-04 DIAGNOSIS — Z7984 Long term (current) use of oral hypoglycemic drugs: Secondary | ICD-10-CM | POA: Diagnosis not present

## 2023-10-04 DIAGNOSIS — H2513 Age-related nuclear cataract, bilateral: Secondary | ICD-10-CM

## 2023-10-04 DIAGNOSIS — E113393 Type 2 diabetes mellitus with moderate nonproliferative diabetic retinopathy without macular edema, bilateral: Secondary | ICD-10-CM | POA: Diagnosis not present

## 2023-10-04 DIAGNOSIS — I10 Essential (primary) hypertension: Secondary | ICD-10-CM | POA: Diagnosis not present

## 2023-10-04 DIAGNOSIS — H35033 Hypertensive retinopathy, bilateral: Secondary | ICD-10-CM

## 2023-10-20 ENCOUNTER — Encounter: Payer: Self-pay | Admitting: Internal Medicine

## 2023-11-08 ENCOUNTER — Other Ambulatory Visit: Payer: Self-pay | Admitting: Internal Medicine

## 2023-11-08 DIAGNOSIS — F410 Panic disorder [episodic paroxysmal anxiety] without agoraphobia: Secondary | ICD-10-CM

## 2023-11-28 NOTE — Telephone Encounter (Unsigned)
 Copied from CRM [id number]. Topic: Clinical - Prescription Issue >> Nov 28, 2023  3:48 PM Pierre Bali B wrote: Reason for CRM: patient called in and stated that he might not have insurance by the end of the month due to job dropping the insurance and waiting for new plan benefits . Patient was wondering if he can get 90 supply of all medications to be on the safe wide if possible .    clobetasol cream (TEMOVATE) 0.05 % clonazePAM (KLONOPIN) 1 MG tablet cyclobenzaprine (FLEXERIL) 5 MG tablet glucose blood (ACCU-CHEK GUIDE) test strip lisinopril (ZESTRIL) 2.5 MG tablet omeprazole (PRILOSEC) 40 MG capsule rosuvastatin (CRESTOR) 10 MG tablet Semaglutide, 2 MG/DOSE, (OZEMPIC, 2 MG/DOSE,) 8 MG/3ML SOPN sertraline (ZOLOFT) 100 MG tablet sildenafil (VIAGRA) 50 MG tablet triamcinolone cream (KENALOG) 0.1 %

## 2023-12-02 ENCOUNTER — Other Ambulatory Visit: Payer: Self-pay

## 2023-12-02 DIAGNOSIS — K219 Gastro-esophageal reflux disease without esophagitis: Secondary | ICD-10-CM

## 2023-12-02 DIAGNOSIS — I152 Hypertension secondary to endocrine disorders: Secondary | ICD-10-CM

## 2023-12-02 DIAGNOSIS — E1165 Type 2 diabetes mellitus with hyperglycemia: Secondary | ICD-10-CM

## 2023-12-02 DIAGNOSIS — M5412 Radiculopathy, cervical region: Secondary | ICD-10-CM

## 2023-12-02 DIAGNOSIS — F331 Major depressive disorder, recurrent, moderate: Secondary | ICD-10-CM

## 2023-12-02 DIAGNOSIS — E782 Mixed hyperlipidemia: Secondary | ICD-10-CM

## 2023-12-02 DIAGNOSIS — F410 Panic disorder [episodic paroxysmal anxiety] without agoraphobia: Secondary | ICD-10-CM

## 2023-12-02 MED ORDER — ROSUVASTATIN CALCIUM 10 MG PO TABS: 10.0000 mg | ORAL_TABLET | Freq: Every day | ORAL | 1 refills | Status: DC

## 2023-12-02 MED ORDER — CYCLOBENZAPRINE HCL 5 MG PO TABS: 5.0000 mg | ORAL_TABLET | Freq: Two times a day (BID) | ORAL | 1 refills | Status: AC | PRN

## 2023-12-02 MED ORDER — LISINOPRIL 2.5 MG PO TABS: 2.5000 mg | ORAL_TABLET | Freq: Every day | ORAL | 5 refills | Status: DC

## 2023-12-02 MED ORDER — OMEPRAZOLE 40 MG PO CPDR
40.0000 mg | DELAYED_RELEASE_CAPSULE | Freq: Every day | ORAL | 3 refills | Status: AC
Start: 2023-12-02 — End: ?

## 2023-12-02 MED ORDER — CLONAZEPAM 1 MG PO TABS: 1.0000 mg | ORAL_TABLET | Freq: Every day | ORAL | 1 refills | Status: DC | PRN

## 2023-12-02 MED ORDER — SERTRALINE HCL 100 MG PO TABS: 100.0000 mg | ORAL_TABLET | Freq: Every day | ORAL | 1 refills | Status: DC

## 2023-12-02 MED ORDER — OZEMPIC (2 MG/DOSE) 8 MG/3ML ~~LOC~~ SOPN: 2.0000 mg | PEN_INJECTOR | SUBCUTANEOUS | 5 refills | Status: DC

## 2023-12-02 NOTE — Telephone Encounter (Signed)
 Refills sent

## 2023-12-02 NOTE — Addendum Note (Signed)
 Addended byTrena Platt on: 12/02/2023 05:29 PM   Modules accepted: Orders

## 2023-12-03 ENCOUNTER — Telehealth: Payer: Self-pay | Admitting: Internal Medicine

## 2023-12-03 NOTE — Telephone Encounter (Signed)
 Copied from CRM [id number]. Topic: Clinical - Prescription Issue >> Dec 03, 2023 12:33 PM Gery Pray wrote: Reason for CRM: Patient would like the provider or provider's nurse to give him a callback regardingclobetasol cream (TEMOVATE) 0.05 %,  clonazePAM (KLONOPIN) 1 MG tablet, cyclobenzaprine (FLEXERIL) 5 MG tablet, glucose blood (ACCU-CHEK GUIDE) test strip, lisinopril (ZESTRIL) 2.5 MG tablet, omeprazole (PRILOSEC) 40 MG capsule, rosuvastatin (CRESTOR) 10 MG tablet, Semaglutide, 2 MG/DOSE, (OZEMPIC, 2 MG/DOSE,) 8 MG/3ML SOPN, sertraline (ZOLOFT) 100 MG tablet, sildenafil (VIAGRA) 50 MG tablet, triamcinolone cream (KENALOG) 0.1 % . Patient can be contacted at [phone].

## 2023-12-03 NOTE — Telephone Encounter (Signed)
 Spoke with pt will keep his appt for 04/10 to discuss other medications.

## 2023-12-05 ENCOUNTER — Ambulatory Visit (INDEPENDENT_AMBULATORY_CARE_PROVIDER_SITE_OTHER): Payer: Self-pay | Admitting: Internal Medicine

## 2023-12-05 ENCOUNTER — Encounter: Payer: Self-pay | Admitting: Internal Medicine

## 2023-12-05 VITALS — BP 116/63 | HR 80 | Ht 66.0 in | Wt 241.2 lb

## 2023-12-05 DIAGNOSIS — F331 Major depressive disorder, recurrent, moderate: Secondary | ICD-10-CM

## 2023-12-05 DIAGNOSIS — E782 Mixed hyperlipidemia: Secondary | ICD-10-CM | POA: Diagnosis not present

## 2023-12-05 DIAGNOSIS — Z7985 Long-term (current) use of injectable non-insulin antidiabetic drugs: Secondary | ICD-10-CM

## 2023-12-05 DIAGNOSIS — M5412 Radiculopathy, cervical region: Secondary | ICD-10-CM

## 2023-12-05 DIAGNOSIS — E1159 Type 2 diabetes mellitus with other circulatory complications: Secondary | ICD-10-CM | POA: Diagnosis not present

## 2023-12-05 DIAGNOSIS — E1169 Type 2 diabetes mellitus with other specified complication: Secondary | ICD-10-CM

## 2023-12-05 DIAGNOSIS — I152 Hypertension secondary to endocrine disorders: Secondary | ICD-10-CM

## 2023-12-05 MED ORDER — CELECOXIB 200 MG PO CAPS: 200.0000 mg | ORAL_CAPSULE | Freq: Two times a day (BID) | ORAL | 1 refills | Status: DC

## 2023-12-05 MED ORDER — GABAPENTIN 300 MG PO CAPS: 300.0000 mg | ORAL_CAPSULE | Freq: Every day | ORAL | 1 refills | Status: DC

## 2023-12-05 NOTE — Assessment & Plan Note (Signed)
 Lab Results  Component Value Date   HGBA1C 5.8 (H) 09/23/2023   Well-controlled now Associated with HTN and HLD On Ozempic 2 mg qw now DCed Metformin 1000 mg BID due to GI side effects Used to be on insulin in the past Advised to follow diabetic diet On ARB and statin F/u CMP and lipid panel Diabetic eye exam: Advised to follow up with Ophthalmology for diabetic eye exam

## 2023-12-05 NOTE — Assessment & Plan Note (Signed)
 Neck pain with radicular symptoms likely due to cervical radiculopathy Checked x-ray of cervical spine Avoid oral steroids due to DM Celebrex as needed for pain Started Gabapentin due to neuropathic symptoms Flexeril as needed for muscle spasms Heating pad and/or massage PRN

## 2023-12-05 NOTE — Progress Notes (Addendum)
 Established Patient Office Visit  Subjective:  Patient ID: Gary Thompson, male    DOB: 07-15-1971  Age: 53 y.o. MRN: [id number]  CC:  Chief Complaint  Patient presents with   Neck Pain    Pt reports neck pain   Diabetes   Hypertension    HPI Gary Thompson is a 53 y.o. male with past medical history of type II DM, HTN, HLD, panic disorder and OSA who presents for c/o neck pain and f/u of chronic medical conditions.  Type II DM: He takes Ozempic  2 mg qw.  He has stopped taking metformin  as he was having diarrhea and abdominal pain with it. His blood glucose has been overall better controlled, from 80s to 140s.  His last HbA1c was 5.8 in 01/25.  Denies any polyuria or polyphagia.  He is trying to improve his diet.  HTN: His BP was wnl today.  He was placed on lisinopril  only instead of olmesartan  and amlodipine  due to hypotensive spells.  Denies any headache, dizziness, chest pain, dyspnea or palpitations.  MDD and panic disorder: He has been more stressed and anxious due to his current relationship with his girlfriend and his school (HV[employer] course).  He stays with her and was worried that he would be told to leave if he has more arguments with her.  He had noticed improvement in his symptoms since taking Zoloft , but has had anxiety spells recently due to arguments with his girlfriend and stress from his school. He takes Klonopin  as needed for severe anxiety.  Erectile dysfunction: He reports difficulty and maintaining erection, but has adequate response with Viagra  100 mg as needed.  Denies any dysuria, hematuria or urethral discharge currently.  Neck pain: He reports chronic, intermittent neck pain, radiating towards left shoulder, associated with numbness and tingling of left LE.  He has tried a Scientist, product/process development powder with some relief. His x-ray of cervical spine showed spondylosis.  Past Medical History:  Diagnosis Date   Anxiety    Arthritis    Depression    Diabetes mellitus without  complication (HCC)    Hyperlipidemia    Hypertension    Sleep apnea    Vertigo     Past Surgical History:  Procedure Laterality Date   CHOLECYSTECTOMY N/A 08/11/2020   Procedure: LAPAROSCOPIC CHOLECYSTECTOMY;  Surgeon: Awilda Bogus, MD;  Location: [facility];  Service: General;  Laterality: N/A;   GALLBLADDER SURGERY     KNEE SURGERY     UMBILICAL HERNIA REPAIR N/A 08/11/2020   Procedure: HERNIA REPAIR UMBILICAL ADULT;  Surgeon: Awilda Bogus, MD;  Location: [facility];  Service: General;  Laterality: N/A;    Family History  Problem Relation Age of Onset   Cancer Mother    Hypertension Mother    Diabetes Mother    Colon polyps Father    Cancer Father    Hypertension Father    Diabetes Father    Colon cancer Paternal Grandmother        not sure of age of onset or death   Cancer Paternal Grandmother    COPD Paternal Grandfather    Esophageal cancer Neg Hx    Rectal cancer Neg Hx    Stomach cancer Neg Hx     Social History   Socioeconomic History   Marital status: Divorced    Spouse name: Not on file   Number of children: 0   Years of education: Not on file   Highest education level: GED or equivalent  Occupational History  Not on file  Tobacco Use   Smoking status: Never   Smokeless tobacco: Never  Vaping Use   Vaping status: Never Used  Substance and Sexual Activity   Alcohol use: Yes    Comment: occ; usually less than 1 per week   Drug use: Not Currently   Sexual activity: Yes  Other Topics Concern   Not on file  Social History Narrative   Not on file   Social Drivers of Health   Financial Resource Strain: High Risk (09/19/2023)   Overall Financial Resource Strain (CARDIA)    Difficulty of Paying Living Expenses: Very hard  Food Insecurity: Food Insecurity Present (09/19/2023)   Hunger Vital Sign    Worried About Running Out of Food in the Last Year: Sometimes true    Ran Out of Food in the Last Year: Sometimes true  Transportation Needs: No  Transportation Needs (09/19/2023)   PRAPARE - Administrator, Civil Service (Medical): No    Lack of Transportation (Non-Medical): No  Physical Activity: Sufficiently Active (07/14/2023)   Exercise Vital Sign    Days of Exercise per Week: 5 days    Minutes of Exercise per Session: 150+ min  Stress: Stress Concern Present (09/19/2023)   [employer] of Occupational Health - Occupational Stress Questionnaire    Feeling of Stress : Very much  Social Connections: Socially Isolated (09/19/2023)   Social Connection and Isolation Panel [NHANES]    Frequency of Communication with Friends and Family: More than three times a week    Frequency of Social Gatherings with Friends and Family: Once a week    Attends Religious Services: Never    Database administrator or Organizations: No    Attends Engineer, structural: Not on file    Marital Status: Divorced  Catering manager Violence: Not on file    Outpatient Medications Prior to Visit  Medication Sig Dispense Refill   Blood Glucose Monitoring Suppl (ACCU-CHEK GUIDE) w/Device KIT 1 kit by Does not apply route daily. 1 kit 0   clonazePAM  (KLONOPIN ) 1 MG tablet Take 1 tablet (1 mg total) by mouth daily as needed. 90 tablet 1   cyclobenzaprine  (FLEXERIL ) 5 MG tablet Take 1 tablet (5 mg total) by mouth 2 (two) times daily as needed for muscle spasms. 30 tablet 1   glucose blood (ACCU-CHEK GUIDE) test strip Use as instructed 100 each 12   lisinopril  (ZESTRIL ) 2.5 MG tablet Take 1 tablet (2.5 mg total) by mouth daily. 30 tablet 5   omeprazole  (PRILOSEC) 40 MG capsule Take 1 capsule (40 mg total) by mouth daily. 90 capsule 3   rosuvastatin  (CRESTOR ) 10 MG tablet Take 1 tablet (10 mg total) by mouth daily. 90 tablet 1   Semaglutide , 2 MG/DOSE, (OZEMPIC , 2 MG/DOSE,) 8 MG/3ML SOPN Inject 2 mg into the skin every 7 (seven) days. 3 mL 5   sertraline  (ZOLOFT ) 100 MG tablet Take 1 tablet (100 mg total) by mouth daily. 90 tablet 1    sildenafil  (VIAGRA ) 50 MG tablet Take 1 tablet (50 mg total) by mouth daily as needed for erectile dysfunction. 10 tablet 2   clobetasol  cream (TEMOVATE ) 0.05 % Apply topically 2 (two) times daily. 30 g 2   triamcinolone  cream (KENALOG ) 0.1 % Apply 1 Application topically 2 (two) times daily. 80 g 0   No facility-administered medications prior to visit.    Allergies  Allergen Reactions   Dm-Doxylamine-Acetaminophen  Hives and Shortness Of Breath    "Nyquill" and "  Vicks 44-D" Hives States difficulty breathing    Dextromethorphan    Vicks Dayquil-Nyquil Cld & Flu [Pe-Dm-Apap & Doxylamin-Dm-Apap] Other (See Comments)    "overly sedated feeling'    ROS Review of Systems  Constitutional:  Negative for chills and fever.  HENT:  Negative for congestion and sore throat.   Eyes:  Negative for pain and discharge.  Respiratory:  Negative for cough and shortness of breath.   Cardiovascular:  Negative for chest pain and palpitations.  Gastrointestinal:  Negative for diarrhea, nausea and vomiting.  Endocrine: Negative for polydipsia and polyuria.  Genitourinary:  Negative for dysuria and hematuria.  Musculoskeletal:  Positive for neck pain. Negative for neck stiffness.  Skin:  Positive for rash.  Neurological:  Negative for dizziness, weakness, numbness and headaches.  Psychiatric/Behavioral:  Positive for sleep disturbance. Negative for agitation and behavioral problems. The patient is nervous/anxious.       Objective:    Physical Exam Vitals reviewed.  Constitutional:      General: He is not in acute distress.    Appearance: He is obese. He is not diaphoretic.  HENT:     Head: Normocephalic and atraumatic.     Nose: Nose normal.     Mouth/Throat:     Mouth: Mucous membranes are moist.  Eyes:     General: No scleral icterus.    Extraocular Movements: Extraocular movements intact.  Cardiovascular:     Rate and Rhythm: Normal rate and regular rhythm.     Heart sounds: Normal heart  sounds. No murmur heard. Pulmonary:     Breath sounds: Normal breath sounds. No wheezing or rales.  Musculoskeletal:     Cervical back: Neck supple. Tenderness present.     Right lower leg: No edema.     Left lower leg: No edema.  Skin:    General: Skin is warm.     Findings: Rash (Erythematous patches over bilateral shin area) present.  Neurological:     General: No focal deficit present.     Mental Status: He is alert and oriented to person, place, and time.     Cranial Nerves: No cranial nerve deficit.     Sensory: Sensory deficit (Left hand - 4th and 5th digits) present.     Motor: No weakness.  Psychiatric:        Mood and Affect: Mood normal.        Behavior: Behavior normal.     BP 116/63   Pulse 80   Ht 5\' 6"  (1.676 m)   Wt 241 lb 3.2 oz (109.4 kg)   SpO2 98%   BMI 38.93 kg/m  Wt Readings from Last 3 Encounters:  12/05/23 241 lb 3.2 oz (109.4 kg)  09/23/23 242 lb 3.2 oz (109.9 kg)  07/18/23 237 lb 3.2 oz (107.6 kg)    No results found for: "TSH" Lab Results  Component Value Date   WBC 11.6 (H) 01/24/2023   HGB 15.0 01/24/2023   HCT 45.3 01/24/2023   MCV 84 01/24/2023   PLT 237 01/24/2023   Lab Results  Component Value Date   NA 141 09/23/2023   K 4.1 09/23/2023   CO2 22 09/23/2023   GLUCOSE 154 (H) 09/23/2023   BUN 13 09/23/2023   CREATININE 1.38 (H) 09/23/2023   BILITOT 0.3 09/23/2023   ALKPHOS 79 09/23/2023   AST 7 09/23/2023   ALT 10 09/23/2023   PROT 6.3 09/23/2023   ALBUMIN 3.8 09/23/2023   CALCIUM  8.7 09/23/2023   ANIONGAP 10  08/17/2020   EGFR 62 09/23/2023   Lab Results  Component Value Date   CHOL 91 (L) 04/30/2023   Lab Results  Component Value Date   HDL 29 (L) 04/30/2023   Lab Results  Component Value Date   LDLCALC 42 04/30/2023   Lab Results  Component Value Date   TRIG 109 04/30/2023   Lab Results  Component Value Date   CHOLHDL 3.1 04/30/2023   Lab Results  Component Value Date   HGBA1C 5.8 (H) 09/23/2023       Assessment & Plan:   Problem List Items Addressed This Visit       Cardiovascular and Mediastinum   Hypertension associated with diabetes (HCC)   BP Readings from Last 1 Encounters:  12/05/23 116/63   Well-controlled DCed amlodipine  and olmesartan  due to hypotension in the last visit On Lisinopril  2.5 mg considering h/o DM Counseled for compliance with the medications Advised DASH diet and moderate exercise/walking, at least 150 mins/week        Endocrine   Type 2 diabetes mellitus with other specified complication (HCC)   Lab Results  Component Value Date   HGBA1C 5.8 (H) 09/23/2023   Well-controlled now Associated with HTN and HLD On Ozempic  2 mg qw now DCed Metformin  1000 mg BID due to GI side effects Used to be on insulin  in the past Advised to follow diabetic diet On ARB and statin F/u CMP and lipid panel Diabetic eye exam: Advised to follow up with Ophthalmology for diabetic eye exam      Relevant Orders   CMP14+EGFR   Hemoglobin A1c     Nervous and Auditory   Cervical radiculopathy - Primary   Neck pain with radicular symptoms likely due to cervical radiculopathy Checked x-ray of cervical spine Avoid oral steroids due to DM Celebrex  as needed for pain Started Gabapentin  due to neuropathic symptoms Flexeril  as needed for muscle spasms Heating pad and/or massage PRN      Relevant Medications   gabapentin  (NEURONTIN ) 300 MG capsule   celecoxib  (CELEBREX ) 200 MG capsule   Other Relevant Orders   Ambulatory referral to Orthopedic Surgery     Other   Mixed hyperlipidemia   Checked lipid profile On Crestor       Relevant Orders   Lipid Profile   Moderate episode of recurrent major depressive disorder (HCC)   Uncontrolled, but his school related stress and his current relationship also contributing Increased dose of Zoloft  to 100 mg once daily in the last visit His anxiety is due to his current housing condition with his girlfriend Has  Klonopin  as needed for panic episode        Meds ordered this encounter  Medications   gabapentin  (NEURONTIN ) 300 MG capsule    Sig: Take 1 capsule (300 mg total) by mouth at bedtime.    Dispense:  90 capsule    Refill:  1   celecoxib  (CELEBREX ) 200 MG capsule    Sig: Take 1 capsule (200 mg total) by mouth 2 (two) times daily.    Dispense:  60 capsule    Refill:  1    Follow-up: Return if symptoms worsen or fail to improve.    Meldon Sport, MD

## 2023-12-05 NOTE — Patient Instructions (Addendum)
 Please start taking Gabapentin as prescribed.  Please take Celecoxib instead of Ibuprofen as needed for neck pain. Try to limit it to once a day.  Please take Flexeril as needed for neck muscle spasms.  Please continue to take other medications as prescribed.  Please continue to follow low carb diet and perform moderate exercise/walking at least 150 mins/week.  Please get fasting blood tests done before the next visit.

## 2023-12-05 NOTE — Assessment & Plan Note (Signed)
 Uncontrolled, but his school related stress and his current relationship also contributing Increased dose of Zoloft to 100 mg once daily in the last visit His anxiety is due to his current housing condition with his girlfriend Has Klonopin as needed for panic episode

## 2023-12-05 NOTE — Assessment & Plan Note (Signed)
 BP Readings from Last 1 Encounters:  12/05/23 116/63   Well-controlled DCed amlodipine and olmesartan due to hypotension in the last visit On Lisinopril 2.5 mg considering h/o DM Counseled for compliance with the medications Advised DASH diet and moderate exercise/walking, at least 150 mins/week

## 2023-12-05 NOTE — Assessment & Plan Note (Signed)
 Checked lipid profile On Crestor

## 2023-12-16 ENCOUNTER — Encounter: Payer: Self-pay | Admitting: Physical Medicine and Rehabilitation

## 2023-12-16 ENCOUNTER — Ambulatory Visit (INDEPENDENT_AMBULATORY_CARE_PROVIDER_SITE_OTHER): Admitting: Physical Medicine and Rehabilitation

## 2023-12-16 DIAGNOSIS — R202 Paresthesia of skin: Secondary | ICD-10-CM

## 2023-12-16 DIAGNOSIS — M542 Cervicalgia: Secondary | ICD-10-CM | POA: Diagnosis not present

## 2023-12-16 DIAGNOSIS — M5412 Radiculopathy, cervical region: Secondary | ICD-10-CM | POA: Diagnosis not present

## 2023-12-16 MED ORDER — DIAZEPAM 5 MG PO TABS: ORAL_TABLET | ORAL | 0 refills | Status: DC

## 2023-12-16 NOTE — Progress Notes (Signed)
 Gary Thompson - 53 y.o. male MRN [id number]  Date of birth: [birth year]  Office Visit Note: Visit Date: 12/16/2023 PCP: Meldon Sport, MD Referred by: Meldon Sport, MD  Subjective: Chief Complaint  Patient presents with   Neck - Pain   HPI: Gary Thompson is a 53 y.o. male who comes in today Per the request of Dr. Cleola Dach for evaluation of chronic, worsening and severe left sided neck pain radiating to shoulder and down arm to 4th and 5th digits. Pain ongoing intermittently for several years, pain worsened about 4 weeks ago. His pain worsens with extension of neck and driving. He is currently working as Curator, job duties seem to cause increased pain. He describes pain as sore, tight and numbness, currently rates as 2 out of 10. Some relief of pain with home exercise regimen, rest and use of medications. Some relief of pain with Gabapentin  and Celebrex . Recent cervical radiographs show mild multilevel spondylosis and facet arthropathy. No history of cervical surgery/injections. Patient denies focal weakness. No recent trauma or falls.   Patients course is complicated by diabetes mellitus, panic disorder, anxiety and depression      Review of Systems  Musculoskeletal:  Positive for myalgias and neck pain.  Neurological:  Positive for tingling. Negative for focal weakness and weakness.  All other systems reviewed and are negative.  Otherwise per HPI.  Assessment & Plan: Visit Diagnoses:    ICD-10-CM   1. Cervical radiculopathy  M54.12 Ambulatory referral to Physical Therapy    2. Paresthesia of skin  R20.2 Ambulatory referral to Physical Therapy    3. Cervicalgia  M54.2 MR CERVICAL SPINE WO CONTRAST    Ambulatory referral to Physical Therapy       Plan: Findings:  Chronic, worsening and severe left sided neck pain radiating to shoulder and down left arm to 4th and 5th digits (ulnar distribution). Patient continues to have severe pain despite good conservative  therapies such as home exercise regimen, rest and use of medications. Patients clinical presentation and exam are complex, differentials include cervical radiculopathy, myofascial pain syndrome and ulnar nerve neuropathy. He does have paresthesias to entire left hand, most significant to 4th and 5th digits. No history of upper extremity nerve study. I discussed treatment plan with him in detail today. Next step is to place order for cervical MRI imaging. I also placed order for short course of formal physical therapy. He does have myofascial tenderness to bilateral levator scapulae and trapezius regions upon palpation. Depending on results of cervical MRI we would consider performing cervical injections. If we feel this is more of an ulnar neuropathy would place order for upper extremity nerve study. He does voice severe anxiety related to MRI imaging due to claustrophobia. I will make sure to order open MRI and also prescribed pre-procedure Valium  for him to take prior to imaging. We will see him back for cervical MRI review and to discuss options. He has no questions at this time. No red flag symptoms noted upon exam today.     Meds & Orders:  Meds ordered this encounter  Medications   diazepam  (VALIUM ) 5 MG tablet    Sig: Take one tablet by mouth with food one hour prior to procedure.    Dispense:  1 tablet    Refill:  0    Orders Placed This Encounter  Procedures   MR CERVICAL SPINE WO CONTRAST   Ambulatory referral to Physical Therapy    Follow-up: No follow-ups on file.  Procedures: No procedures performed      Clinical History: Narrative & Impression CLINICAL DATA:  Left-greater-than-right neck pain, chronically. Worsens with extension.   EXAM: CERVICAL SPINE - COMPLETE 4+ VIEW   COMPARISON:  None Available.   FINDINGS: No evidence of acute fracture or traumatic listhesis. Mild multilevel spondylosis. No significant disc space height loss. Normal prevertebral soft tissues.  Mild multilevel facet arthropathy greatest at C6-C7. No severe bony neural foraminal narrowing.   IMPRESSION: Mild multilevel spondylosis and facet arthropathy.     Electronically Signed   By: Rozell Cornet M.D.   On: 09/29/2023 22:15   He reports that he has never smoked. He has never used smokeless tobacco.  Recent Labs    12/27/22 0856 04/30/23 0933 09/23/23 0843  HGBA1C 6.9 6.8* 5.8*    Objective:  VS:  HT:    WT:   BMI:     BP:   HR: bpm  TEMP: ( )  RESP:  Physical Exam Vitals and nursing note reviewed.  HENT:     Head: Normocephalic and atraumatic.     Right Ear: External ear normal.     Left Ear: External ear normal.     Nose: Nose normal.     Mouth/Throat:     Mouth: Mucous membranes are moist.  Eyes:     Extraocular Movements: Extraocular movements intact.  Cardiovascular:     Rate and Rhythm: Normal rate.     Pulses: Normal pulses.  Pulmonary:     Effort: Pulmonary effort is normal.  Abdominal:     General: Abdomen is flat. There is no distension.  Musculoskeletal:        General: Tenderness present.     Cervical back: Tenderness present.     Comments: Discomfort noted with extension. Patient has good strength in the upper extremities including 5 out of 5 strength in wrist extension, long finger flexion and APB. Shoulder range of motion is full bilaterally without any sign of impingement. There is no atrophy of the hands intrinsically. Sensation intact bilaterally. Negative Hoffman's sign. Negative Spurling's sign. Negative Tinel's sign bilaterally.     Skin:    General: Skin is warm and dry.     Capillary Refill: Capillary refill takes less than 2 seconds.  Neurological:     General: No focal deficit present.     Mental Status: He is alert and oriented to person, place, and time.  Psychiatric:        Mood and Affect: Mood normal.        Behavior: Behavior normal.     Ortho Exam  Imaging: No results found.  Past  Medical/Family/Surgical/Social History: Medications & Allergies reviewed per EMR, new medications updated. Patient Active Problem List   Diagnosis Date Noted   Cervical radiculopathy 09/23/2023   Gastroesophageal reflux disease without esophagitis 07/18/2023   Allergic dermatitis 05/28/2023   Erectile dysfunction 12/27/2022   Moderate episode of recurrent major depressive disorder (HCC) 12/27/2022   Mixed hyperlipidemia 09/28/2022   Positive colorectal cancer screening using Cologuard test 08/13/2022   Hypertension associated with diabetes (HCC) 08/14/2021   Encounter for general adult medical examination with abnormal findings 05/15/2021   OSA (obstructive sleep apnea) 05/15/2021   Panic disorder 08/31/2020   Type 2 diabetes mellitus with other specified complication (HCC) 08/31/2020   Hemorrhoids 08/31/2020   Past Medical History:  Diagnosis Date   Anxiety    Arthritis    Depression    Diabetes mellitus without complication (HCC)  Hyperlipidemia    Hypertension    Sleep apnea    Vertigo    Family History  Problem Relation Age of Onset   Cancer Mother    Hypertension Mother    Diabetes Mother    Colon polyps Father    Cancer Father    Hypertension Father    Diabetes Father    Colon cancer Paternal Grandmother        not sure of age of onset or death   Cancer Paternal Grandmother    COPD Paternal Grandfather    Esophageal cancer Neg Hx    Rectal cancer Neg Hx    Stomach cancer Neg Hx    Past Surgical History:  Procedure Laterality Date   CHOLECYSTECTOMY N/A 08/11/2020   Procedure: LAPAROSCOPIC CHOLECYSTECTOMY;  Surgeon: Awilda Bogus, MD;  Location: [facility];  Service: General;  Laterality: N/A;   GALLBLADDER SURGERY     KNEE SURGERY     UMBILICAL HERNIA REPAIR N/A 08/11/2020   Procedure: HERNIA REPAIR UMBILICAL ADULT;  Surgeon: Awilda Bogus, MD;  Location: [facility];  Service: General;  Laterality: N/A;   Social History   Occupational History   Not  on file  Tobacco Use   Smoking status: Never   Smokeless tobacco: Never  Vaping Use   Vaping status: Never Used  Substance and Sexual Activity   Alcohol use: Yes    Comment: occ; usually less than 1 per week   Drug use: Not Currently   Sexual activity: Yes

## 2023-12-16 NOTE — Progress Notes (Signed)
 Pain Scale   Average Pain 2 Patient advising his pain starts in his Neck while working and he is advising he has numbness and tingling in his left arm and fingers. Patient that his job requires him to look upwards and work with his hands.        +Driver, -BT, -Dye Allergies.

## 2023-12-19 ENCOUNTER — Encounter: Payer: Self-pay | Admitting: Physical Medicine and Rehabilitation

## 2023-12-25 LAB — LIPID PANEL
Chol/HDL Ratio: 2.6 ratio (ref 0.0–5.0)
Cholesterol, Total: 100 mg/dL (ref 100–199)
HDL: 39 mg/dL — ABNORMAL LOW (ref >39)
LDL Chol Calc (NIH): 45 mg/dL (ref 0–99)
Triglycerides: 74 mg/dL (ref 0–149)
VLDL Cholesterol Cal: 16 mg/dL (ref 5–40)

## 2023-12-25 LAB — CMP14+EGFR
ALT: 15 IU/L (ref 0–44)
AST: 14 IU/L (ref 0–40)
Albumin: 4.2 g/dL (ref 3.8–4.9)
Alkaline Phosphatase: 71 IU/L (ref 44–121)
BUN/Creatinine Ratio: 10 (ref 9–20)
BUN: 14 mg/dL (ref 6–24)
Bilirubin Total: 0.4 mg/dL (ref 0.0–1.2)
CO2: 25 mmol/L (ref 20–29)
Calcium: 9.3 mg/dL (ref 8.7–10.2)
Chloride: 103 mmol/L (ref 96–106)
Creatinine, Ser: 1.35 mg/dL — ABNORMAL HIGH (ref 0.76–1.27)
Globulin, Total: 1.9 g/dL (ref 1.5–4.5)
Glucose: 112 mg/dL — ABNORMAL HIGH (ref 70–99)
Potassium: 4.2 mmol/L (ref 3.5–5.2)
Sodium: 142 mmol/L (ref 134–144)
Total Protein: 6.1 g/dL (ref 6.0–8.5)
eGFR: 64 mL/min/{1.73_m2} (ref >59)

## 2023-12-25 LAB — HEMOGLOBIN A1C
Est. average glucose Bld gHb Est-mCnc: 126 mg/dL
Hgb A1c MFr Bld: 6 % — ABNORMAL HIGH (ref 4.8–5.6)

## 2023-12-26 ENCOUNTER — Ambulatory Visit: Payer: 59 | Admitting: Internal Medicine

## 2023-12-26 ENCOUNTER — Encounter: Payer: Self-pay | Admitting: Internal Medicine

## 2023-12-26 VITALS — BP 132/80 | HR 91 | Ht 66.0 in | Wt 245.4 lb

## 2023-12-26 DIAGNOSIS — E782 Mixed hyperlipidemia: Secondary | ICD-10-CM

## 2023-12-26 DIAGNOSIS — I152 Hypertension secondary to endocrine disorders: Secondary | ICD-10-CM

## 2023-12-26 DIAGNOSIS — Z7985 Long-term (current) use of injectable non-insulin antidiabetic drugs: Secondary | ICD-10-CM

## 2023-12-26 DIAGNOSIS — M5412 Radiculopathy, cervical region: Secondary | ICD-10-CM | POA: Diagnosis not present

## 2023-12-26 DIAGNOSIS — E1169 Type 2 diabetes mellitus with other specified complication: Secondary | ICD-10-CM | POA: Diagnosis not present

## 2023-12-26 DIAGNOSIS — F331 Major depressive disorder, recurrent, moderate: Secondary | ICD-10-CM

## 2023-12-26 DIAGNOSIS — Z23 Encounter for immunization: Secondary | ICD-10-CM

## 2023-12-26 DIAGNOSIS — E1159 Type 2 diabetes mellitus with other circulatory complications: Secondary | ICD-10-CM

## 2023-12-26 NOTE — Assessment & Plan Note (Addendum)
 Lab Results  Component Value Date   HGBA1C 6.0 (H) 12/24/2023   Well-controlled now Associated with HTN and HLD On Ozempic  2 mg qw now Did not tolerate metformin  - had GI side effects (diarrhea) Used to be on insulin  in the past Advised to follow diabetic diet On ARB and statin F/u CMP and lipid panel Diabetic eye exam: Advised to follow up with Ophthalmology for diabetic eye exam

## 2023-12-26 NOTE — Assessment & Plan Note (Signed)
 Neck pain with radicular symptoms likely due to cervical radiculopathy Checked x-ray of cervical spine, planned to get MRI of cervical spine - followed by Physiatry clinic now Avoid oral steroids due to DM Celebrex  as needed for pain On gabapentin  300 mg nightly due to neuropathic symptoms Flexeril  as needed for muscle spasms Heating pad and/or massage PRN

## 2023-12-26 NOTE — Assessment & Plan Note (Signed)
 BP Readings from Last 1 Encounters:  12/26/23 132/80   Well-controlled DCed amlodipine  and olmesartan  due to hypotension previously On Lisinopril  2.5 mg considering h/o DM Counseled for compliance with the medications Advised DASH diet and moderate exercise/walking, at least 150 mins/week

## 2023-12-26 NOTE — Assessment & Plan Note (Signed)
 Checked lipid profile On Crestor

## 2023-12-26 NOTE — Assessment & Plan Note (Signed)
 Better controlled with Zoloft  100 mg QD, but his school related stress and his current relationship also contributing to anxiety Has Klonopin  as needed for panic episode

## 2023-12-26 NOTE — Progress Notes (Signed)
 Established Patient Office Visit  Subjective:  Patient ID: Gary Thompson, male    DOB: [birth year]  Age: 52 y.o. MRN: [id number]  CC:  Chief Complaint  Patient presents with   Medical Management of Chronic Issues    3 month f/u     HPI Gary Thompson is a 53 y.o. male with past medical history of type II DM, HTN, HLD, panic disorder and OSA who presents for f/u of his chronic medical conditions.  Type II DM: He takes Ozempic  2 mg qw.  He has stopped taking metformin  as he was having diarrhea and abdominal pain with it. His blood glucose has been overall better controlled, from 80s to 140s.  His last HbA1c was 5.8 in 01/25.  Denies any polyuria or polyphagia.  He is trying to improve his diet.  HTN: His BP was wnl today.  He was placed on lisinopril  2.5 mg QD instead of olmesartan  and amlodipine  due to hypotensive spells.  Denies any headache, dizziness, chest pain, dyspnea or palpitations.  MDD and panic disorder: He has been more stressed and anxious due to his current relationship with his girlfriend and his school (HV[employer] course).  He stays with her and was worried that he would be told to leave if he has more arguments with her.  He had noticed improvement in his symptoms since taking Zoloft , but has had anxiety spells recently due to arguments with his girlfriend and stress from his school. He takes Klonopin  as needed for severe anxiety.  Erectile dysfunction: He reports difficulty and maintaining erection, but has adequate response with Viagra  100 mg as needed.  Denies any dysuria, hematuria or urethral discharge currently.  Neck pain: He reports chronic, intermittent neck pain, radiating towards left shoulder, associated with numbness and tingling of left LE.  He has tried a Scientist, product/process development powder with some relief. His x-ray of cervical spine showed spondylosis.  He recently saw physiatry clinic for it, plan to get MRI of cervical spine.  Past Medical History:  Diagnosis Date   Anxiety     Arthritis    Depression    Diabetes mellitus without complication (HCC)    Hyperlipidemia    Hypertension    Sleep apnea    Vertigo     Past Surgical History:  Procedure Laterality Date   CHOLECYSTECTOMY N/A 08/11/2020   Procedure: LAPAROSCOPIC CHOLECYSTECTOMY;  Surgeon: Awilda Bogus, MD;  Location: [facility];  Service: General;  Laterality: N/A;   GALLBLADDER SURGERY     KNEE SURGERY     UMBILICAL HERNIA REPAIR N/A 08/11/2020   Procedure: HERNIA REPAIR UMBILICAL ADULT;  Surgeon: Awilda Bogus, MD;  Location: [facility];  Service: General;  Laterality: N/A;    Family History  Problem Relation Age of Onset   Cancer Mother    Hypertension Mother    Diabetes Mother    Colon polyps Father    Cancer Father    Hypertension Father    Diabetes Father    Colon cancer Paternal Grandmother        not sure of age of onset or death   Cancer Paternal Grandmother    COPD Paternal Grandfather    Esophageal cancer Neg Hx    Rectal cancer Neg Hx    Stomach cancer Neg Hx     Social History   Socioeconomic History   Marital status: Divorced    Spouse name: Not on file   Number of children: 0   Years of education: Not on file  Highest education level: GED or equivalent  Occupational History   Not on file  Tobacco Use   Smoking status: Never   Smokeless tobacco: Never  Vaping Use   Vaping status: Never Used  Substance and Sexual Activity   Alcohol use: Yes    Comment: occ; usually less than 1 per week   Drug use: Not Currently   Sexual activity: Yes  Other Topics Concern   Not on file  Social History Narrative   Not on file   Social Drivers of Health   Financial Resource Strain: High Risk (09/19/2023)   Overall Financial Resource Strain (CARDIA)    Difficulty of Paying Living Expenses: Very hard  Food Insecurity: Food Insecurity Present (09/19/2023)   Hunger Vital Sign    Worried About Running Out of Food in the Last Year: Sometimes true    Ran Out of Food in the  Last Year: Sometimes true  Transportation Needs: No Transportation Needs (09/19/2023)   PRAPARE - Administrator, Civil Service (Medical): No    Lack of Transportation (Non-Medical): No  Physical Activity: Sufficiently Active (07/14/2023)   Exercise Vital Sign    Days of Exercise per Week: 5 days    Minutes of Exercise per Session: 150+ min  Stress: Stress Concern Present (09/19/2023)   [employer] of Occupational Health - Occupational Stress Questionnaire    Feeling of Stress : Very much  Social Connections: Socially Isolated (09/19/2023)   Social Connection and Isolation Panel [NHANES]    Frequency of Communication with Friends and Family: More than three times a week    Frequency of Social Gatherings with Friends and Family: Once a week    Attends Religious Services: Never    Database administrator or Organizations: No    Attends Engineer, structural: Not on file    Marital Status: Divorced  Catering manager Violence: Not on file    Outpatient Medications Prior to Visit  Medication Sig Dispense Refill   Blood Glucose Monitoring Suppl (ACCU-CHEK GUIDE) w/Device KIT 1 kit by Does not apply route daily. 1 kit 0   celecoxib  (CELEBREX ) 200 MG capsule Take 1 capsule (200 mg total) by mouth 2 (two) times daily. 60 capsule 1   clonazePAM  (KLONOPIN ) 1 MG tablet Take 1 tablet (1 mg total) by mouth daily as needed. 90 tablet 1   cyclobenzaprine  (FLEXERIL ) 5 MG tablet Take 1 tablet (5 mg total) by mouth 2 (two) times daily as needed for muscle spasms. 30 tablet 1   diazepam  (VALIUM ) 5 MG tablet Take one tablet by mouth with food one hour prior to procedure. 1 tablet 0   gabapentin  (NEURONTIN ) 300 MG capsule Take 1 capsule (300 mg total) by mouth at bedtime. 90 capsule 1   glucose blood (ACCU-CHEK GUIDE) test strip Use as instructed 100 each 12   lisinopril  (ZESTRIL ) 2.5 MG tablet Take 1 tablet (2.5 mg total) by mouth daily. 30 tablet 5   omeprazole  (PRILOSEC) 40 MG  capsule Take 1 capsule (40 mg total) by mouth daily. 90 capsule 3   rosuvastatin  (CRESTOR ) 10 MG tablet Take 1 tablet (10 mg total) by mouth daily. 90 tablet 1   Semaglutide , 2 MG/DOSE, (OZEMPIC , 2 MG/DOSE,) 8 MG/3ML SOPN Inject 2 mg into the skin every 7 (seven) days. 3 mL 5   sertraline  (ZOLOFT ) 100 MG tablet Take 1 tablet (100 mg total) by mouth daily. 90 tablet 1   sildenafil  (VIAGRA ) 50 MG tablet Take 1 tablet (50  mg total) by mouth daily as needed for erectile dysfunction. 10 tablet 2   No facility-administered medications prior to visit.    Allergies  Allergen Reactions   Dm-Doxylamine-Acetaminophen  Hives and Shortness Of Breath    "Nyquill" and "Vicks 44-D" Hives States difficulty breathing    Dextromethorphan    Vicks Dayquil-Nyquil Cld & Flu [Pe-Dm-Apap & Doxylamin-Dm-Apap] Other (See Comments)    "overly sedated feeling'    ROS Review of Systems  Constitutional:  Negative for chills and fever.  HENT:  Negative for congestion and sore throat.   Eyes:  Negative for pain and discharge.  Respiratory:  Negative for cough and shortness of breath.   Cardiovascular:  Negative for chest pain and palpitations.  Gastrointestinal:  Negative for diarrhea, nausea and vomiting.  Endocrine: Negative for polydipsia and polyuria.  Genitourinary:  Negative for dysuria and hematuria.  Musculoskeletal:  Positive for neck pain. Negative for neck stiffness.  Skin:  Negative for rash.  Neurological:  Negative for dizziness, weakness, numbness and headaches.  Psychiatric/Behavioral:  Positive for sleep disturbance. Negative for agitation and behavioral problems. The patient is nervous/anxious.       Objective:    Physical Exam Vitals reviewed.  Constitutional:      General: He is not in acute distress.    Appearance: He is obese. He is not diaphoretic.  HENT:     Head: Normocephalic and atraumatic.     Nose: Nose normal.     Mouth/Throat:     Mouth: Mucous membranes are moist.   Eyes:     General: No scleral icterus.    Extraocular Movements: Extraocular movements intact.  Cardiovascular:     Rate and Rhythm: Normal rate and regular rhythm.     Heart sounds: Normal heart sounds. No murmur heard. Pulmonary:     Breath sounds: Normal breath sounds. No wheezing or rales.  Musculoskeletal:     Cervical back: Neck supple. Tenderness present.     Right lower leg: No edema.     Left lower leg: No edema.  Skin:    General: Skin is warm.     Findings: No rash.  Neurological:     General: No focal deficit present.     Mental Status: He is alert and oriented to person, place, and time.     Cranial Nerves: No cranial nerve deficit.     Sensory: Sensory deficit (Left hand - 4th and 5th digits) present.     Motor: No weakness.  Psychiatric:        Mood and Affect: Mood normal.        Behavior: Behavior normal.     BP 132/80 (BP Location: Left Arm)   Pulse 91   Ht 5\' 6"  (1.676 m)   Wt 245 lb 6.4 oz (111.3 kg)   SpO2 96%   BMI 39.61 kg/m  Wt Readings from Last 3 Encounters:  12/26/23 245 lb 6.4 oz (111.3 kg)  12/05/23 241 lb 3.2 oz (109.4 kg)  09/23/23 242 lb 3.2 oz (109.9 kg)    No results found for: "TSH" Lab Results  Component Value Date   WBC 11.6 (H) 01/24/2023   HGB 15.0 01/24/2023   HCT 45.3 01/24/2023   MCV 84 01/24/2023   PLT 237 01/24/2023   Lab Results  Component Value Date   NA 142 12/24/2023   K 4.2 12/24/2023   CO2 25 12/24/2023   GLUCOSE 112 (H) 12/24/2023   BUN 14 12/24/2023   CREATININE 1.35 (H) 12/24/2023  BILITOT 0.4 12/24/2023   ALKPHOS 71 12/24/2023   AST 14 12/24/2023   ALT 15 12/24/2023   PROT 6.1 12/24/2023   ALBUMIN 4.2 12/24/2023   CALCIUM  9.3 12/24/2023   ANIONGAP 10 08/17/2020   EGFR 64 12/24/2023   Lab Results  Component Value Date   CHOL 100 12/24/2023   Lab Results  Component Value Date   HDL 39 (L) 12/24/2023   Lab Results  Component Value Date   LDLCALC 45 12/24/2023   Lab Results  Component  Value Date   TRIG 74 12/24/2023   Lab Results  Component Value Date   CHOLHDL 2.6 12/24/2023   Lab Results  Component Value Date   HGBA1C 6.0 (H) 12/24/2023      Assessment & Plan:   Problem List Items Addressed This Visit       Cardiovascular and Mediastinum   Hypertension associated with diabetes (HCC) - Primary   BP Readings from Last 1 Encounters:  12/26/23 132/80   Well-controlled DCed amlodipine  and olmesartan  due to hypotension previously On Lisinopril  2.5 mg considering h/o DM Counseled for compliance with the medications Advised DASH diet and moderate exercise/walking, at least 150 mins/week        Endocrine   Type 2 diabetes mellitus with other specified complication (HCC)   Lab Results  Component Value Date   HGBA1C 6.0 (H) 12/24/2023   Well-controlled now Associated with HTN and HLD On Ozempic  2 mg qw now Did not tolerate metformin  - had GI side effects (diarrhea) Used to be on insulin  in the past Advised to follow diabetic diet On ARB and statin F/u CMP and lipid panel Diabetic eye exam: Advised to follow up with Ophthalmology for diabetic eye exam        Nervous and Auditory   Cervical radiculopathy   Neck pain with radicular symptoms likely due to cervical radiculopathy Checked x-ray of cervical spine, planned to get MRI of cervical spine - followed by Physiatry clinic now Avoid oral steroids due to DM Celebrex  as needed for pain On gabapentin  300 mg nightly due to neuropathic symptoms Flexeril  as needed for muscle spasms Heating pad and/or massage PRN        Other   Mixed hyperlipidemia   Checked lipid profile On Crestor       Moderate episode of recurrent major depressive disorder (HCC)   Better controlled with Zoloft  100 mg QD, but his school related stress and his current relationship also contributing to anxiety Has Klonopin  as needed for panic episode      Other Visit Diagnoses       Encounter for immunization        Relevant Orders   Varicella-zoster vaccine IM (Completed)        No orders of the defined types were placed in this encounter.   Follow-up: Return in about 4 months (around 04/27/2024) for HTN and DM.    Meldon Sport, MD

## 2023-12-26 NOTE — Patient Instructions (Signed)
 Please continue to take medications as prescribed.  Please continue to follow low carb diet and perform moderate exercise/walking at least 150 mins/week.

## 2023-12-27 ENCOUNTER — Other Ambulatory Visit

## 2023-12-27 ENCOUNTER — Encounter: Payer: Self-pay | Admitting: Internal Medicine

## 2023-12-29 ENCOUNTER — Ambulatory Visit
Admission: RE | Admit: 2023-12-29 | Discharge: 2023-12-29 | Discharge status: Home / Self Care | Source: Ambulatory Visit | Attending: Physical Medicine and Rehabilitation | Admitting: Physical Medicine and Rehabilitation

## 2024-01-07 ENCOUNTER — Telehealth: Payer: Self-pay

## 2024-01-07 NOTE — Telephone Encounter (Signed)
 Copied from CRM [id number]. Topic: Clinical - Lab/Test Results >> Jan 07, 2024 10:23 AM Baldemar Lev wrote: Reason for CRM: Pt called for MRI results, please advise

## 2024-01-07 NOTE — Telephone Encounter (Signed)
Results are not available

## 2024-01-09 ENCOUNTER — Other Ambulatory Visit: Payer: Self-pay

## 2024-01-09 ENCOUNTER — Encounter: Payer: Self-pay | Admitting: Internal Medicine

## 2024-01-14 NOTE — Therapy (Signed)
 OUTPATIENT PHYSICAL THERAPY CERVICAL EVALUATION   Patient Name: Gary Thompson MRN: [id number] DOB:15-Mar-1971, 53 y.o., male Today's Date: 01/15/2024  END OF SESSION:  PT End of Session - 01/15/24 1349     Visit Number 1    Number of Visits 4    Date for PT Re-Evaluation 02/14/24    Authorization Type [employer]    PT Start Time 1348    PT Stop Time 1428    PT Time Calculation (min) 40 min    Activity Tolerance Patient tolerated treatment well    Behavior During Therapy WFL for tasks assessed/performed             Past Medical History:  Diagnosis Date   Anxiety    Arthritis    Depression    Diabetes mellitus without complication (HCC)    Hyperlipidemia    Hypertension    Sleep apnea    Vertigo    Past Surgical History:  Procedure Laterality Date   CHOLECYSTECTOMY N/A 08/11/2020   Procedure: LAPAROSCOPIC CHOLECYSTECTOMY;  Surgeon: Awilda Bogus, MD;  Location: [facility];  Service: General;  Laterality: N/A;   GALLBLADDER SURGERY     KNEE SURGERY     UMBILICAL HERNIA REPAIR N/A 08/11/2020   Procedure: HERNIA REPAIR UMBILICAL ADULT;  Surgeon: Awilda Bogus, MD;  Location: [facility];  Service: General;  Laterality: N/A;   Patient Active Problem List   Diagnosis Date Noted   Cervical radiculopathy 09/23/2023   Gastroesophageal reflux disease without esophagitis 07/18/2023   Allergic dermatitis 05/28/2023   Erectile dysfunction 12/27/2022   Moderate episode of recurrent major depressive disorder (HCC) 12/27/2022   Mixed hyperlipidemia 09/28/2022   Positive colorectal cancer screening using Cologuard test 08/13/2022   Hypertension associated with diabetes (HCC) 08/14/2021   Encounter for general adult medical examination with abnormal findings 05/15/2021   OSA (obstructive sleep apnea) 05/15/2021   Panic disorder 08/31/2020   Type 2 diabetes mellitus with other specified complication (HCC) 08/31/2020   Hemorrhoids 08/31/2020    PCP: Meldon Sport,  MD  REFERRING PROVIDER: Darryll Eng, NP  REFERRING DIAG: [phone] (ICD-10-CM) - Cervical radiculopathy R20.2 (ICD-10-CM) - Paresthesia of skin M54.2 (ICD-10-CM) - Cervicalgia  THERAPY DIAG:  Neck pain  Rationale for Evaluation and Treatment: Rehabilitation  ONSET DATE: chronic but worse over the last 4 months  SUBJECTIVE:  SUBJECTIVE STATEMENT: Patient reports chronic neck pain worse In the last 4 months; started running down his left arm.  A little better than it was; saw Lydia Sams; referred to orthopedic MD; ordered an MRI and had it done 2 weeks ago and still waiting on the results.   Hand dominance: Right  PERTINENT HISTORY:  DM History of vertigo   PAIN:  Are you having pain? Yes: NPRS scale: 2/10; worst 9/10 Pain location: left side of neck and shoulder and down left arm Pain description: burns and numb, tingling down left arm; stinging down arm Aggravating factors: turn his head Relieving factors: changes position of head  PRECAUTIONS: None   WEIGHT BEARING RESTRICTIONS: No  FALLS:  Has patient fallen in last 6 months? No  OCCUPATION: Actuary works Curator work  PLOF: Independent  PATIENT GOALS: see if it will help my neck  NEXT MD VISIT: PRN  OBJECTIVE:  Note: Objective measures were completed at Evaluation unless otherwise noted.  DIAGNOSTIC FINDINGS:   Waiting on MRI results FINDINGS: No evidence of acute fracture or traumatic listhesis. Mild multilevel spondylosis. No significant disc space height loss. Normal prevertebral soft tissues. Mild multilevel facet arthropathy greatest at C6-C7. No severe bony neural foraminal narrowing.   IMPRESSION: Mild multilevel spondylosis and facet arthropathy.  PATIENT SURVEYS:  NDI  7/50  COGNITION: Overall cognitive status: Within functional limits for tasks assessed  SENSATION: WFL  POSTURE: rounded shoulders and forward head  PALPATION:  Trigger point noted left upper trap   CERVICAL ROM:   Active ROM AROM (deg) eval  Flexion 29  Extension 46  Right lateral flexion 25  Left lateral flexion 38  Right rotation 62  Left rotation 68   (Blank rows = not tested)  UPPER EXTREMITY MMT's:  Active ROM Right eval Left eval  Shoulder flexion 5* 5  Shoulder extension    Shoulder abduction    Shoulder adduction    Shoulder extension    Shoulder internal rotation    Shoulder external rotation    Elbow flexion 5 5  Elbow extension 5 4+  Wrist flexion    Wrist extension    Wrist ulnar deviation    Wrist radial deviation    Wrist pronation    Wrist supination     (Blank rows = not tested)   CERVICAL SPECIAL TESTS:  Distraction test: Negative  TREATMENT DATE: 01/14/24 physical therapy evaluation and HEP instruction                                                                                                                                 PATIENT EDUCATION:  Education details: Patient educated on exam findings, POC, scope of PT, HEP, and discussion of dry needling. Person educated: Patient Education method: Explanation, Demonstration, and Handouts Education comprehension: verbalized understanding, returned demonstration, verbal cues required, and tactile cues required  HOME EXERCISE PROGRAM: Access Code: [email or web address] URL: [email or web address] Date: 01/15/2024 Prepared by: [facility]  Exercises - seated posture with towel roll  - 1 x daily - 7 x weekly - 1 sets - 1 reps - Seated Scapular Retraction  - 2 x daily - 7 x weekly - 1 sets - 10 reps - 5 sec hold - Seated Cervical Retraction  - 2 x daily - 7 x weekly - 1 sets - 10 reps  ASSESSMENT:  CLINICAL IMPRESSION: Patient is a 53 y.o. male who was seen today for physical  therapy evaluation and treatment for Eval and Treat: Chronic neck pain, myofascial pain syndrome. Consider manual treatments and possible dry needlingM54.12 (ICD-10-CM) - Cervical radiculopathy R20.2 (ICD-10-CM) - Paresthesia of skin M54.2 (ICD-10-CM) - Cervicalgia.  Patient demonstrates decreased strength, ROM restriction, reduced flexibility, increased tenderness to palpation and postural abnormalities which are likely contributing to symptoms of pain and are negatively impacting patient ability to perform ADLs. Patient will benefit from skilled physical therapy services to address these deficits to reduce pain and improve level of function with ADLs   OBJECTIVE IMPAIRMENTS: decreased activity tolerance, decreased mobility, decreased strength, increased fascial restrictions, impaired perceived functional ability, postural dysfunction, and pain.   ACTIVITY LIMITATIONS: carrying, lifting, sitting, and reach over head  PARTICIPATION LIMITATIONS: driving and occupation  PERSONAL FACTORS: 1 comorbidity: DM are also affecting patient's functional outcome.   REHAB POTENTIAL: Good  CLINICAL DECISION MAKING: Evolving/moderate complexity  EVALUATION COMPLEXITY: Moderate   GOALS: Goals reviewed with patient? No  SHORT TERM GOALS: Target date: 01/29/2024  patient will be independent with initial HEP Baseline:  Goal status: INITIAL  2.  Patient will report 50% improvement overall  Baseline:  Goal status: INITIAL   LONG TERM GOALS: Target date: 02/14/2024  Patient will be independent in self management strategies to improve quality of life and functional outcomes.  Baseline:  Goal status: INITIAL  2.  Patient will report 75% improvement overall  Baseline:  Goal status: INITIAL  3.  Patient will improve cervical rotation by 20 degrees to improve ability to scan for safety with driving Baseline: see above Goal status: INITIAL  4.  Patient will work an 8 hour day without any  radiating left upper extremity symptoms Baseline:  Goal status: INITIAL  5.   Patient will increase left triceps  MMT's to 5/5 to allow pushing activities at work without limitatioin Baseline: see above Goal status: INITIAL    PLAN:  PT FREQUENCY: 1x/week  PT DURATION: 4 weeks  PLANNED INTERVENTIONS: 97164- PT Re-evaluation, 97110-Therapeutic exercises, 97530- Therapeutic activity, 97112- Neuromuscular re-education, 97535- Self Care, 16109- Manual therapy, [phone]- Gait training, [phone]- Orthotic Fit/training, [phone]- Canalith repositioning, [id number]- Aquatic Therapy, [phone]- Splinting, Patient/Family education, Balance training, Stair training, Taping, Dry Needling, Joint mobilization, Joint manipulation, Spinal manipulation, Spinal mobilization, Scar mobilization, and DME instructions.   PLAN FOR NEXT SESSION: Review HEP and goals; try manual STM to upper traps, cervical spine; postural strengthening and cervical mobility  2:25 PM, 01/15/24 Gary Thompson MPT [facility] [place] [phone] Ph:[phone]

## 2024-01-15 ENCOUNTER — Ambulatory Visit (HOSPITAL_COMMUNITY): Attending: Physical Medicine and Rehabilitation

## 2024-01-15 ENCOUNTER — Other Ambulatory Visit: Payer: Self-pay

## 2024-01-15 DIAGNOSIS — M542 Cervicalgia: Secondary | ICD-10-CM | POA: Insufficient documentation

## 2024-01-15 DIAGNOSIS — R202 Paresthesia of skin: Secondary | ICD-10-CM | POA: Insufficient documentation

## 2024-01-15 DIAGNOSIS — M5412 Radiculopathy, cervical region: Secondary | ICD-10-CM | POA: Insufficient documentation

## 2024-01-15 NOTE — Patient Instructions (Signed)

## 2024-01-17 ENCOUNTER — Encounter: Payer: Self-pay | Admitting: Physical Medicine and Rehabilitation

## 2024-01-29 ENCOUNTER — Encounter (HOSPITAL_COMMUNITY)

## 2024-01-30 ENCOUNTER — Other Ambulatory Visit: Payer: Self-pay | Admitting: Internal Medicine

## 2024-01-30 DIAGNOSIS — M5412 Radiculopathy, cervical region: Secondary | ICD-10-CM

## 2024-01-31 ENCOUNTER — Ambulatory Visit: Payer: Self-pay | Admitting: Physical Medicine and Rehabilitation

## 2024-02-04 ENCOUNTER — Encounter: Payer: Self-pay | Admitting: Internal Medicine

## 2024-02-05 ENCOUNTER — Encounter (HOSPITAL_COMMUNITY)

## 2024-02-12 ENCOUNTER — Encounter (HOSPITAL_COMMUNITY)

## 2024-02-18 ENCOUNTER — Encounter (HOSPITAL_COMMUNITY)

## 2024-02-25 ENCOUNTER — Encounter (HOSPITAL_COMMUNITY)

## 2024-03-02 ENCOUNTER — Ambulatory Visit: Payer: Self-pay

## 2024-03-02 NOTE — Telephone Encounter (Signed)
 FYI Only or Action Required?: FYI only for provider.  Patient was last seen in primary care on 12/26/2023 by Tobie Suzzane POUR, MD. Called Nurse Triage reporting Anxiety. Symptoms began several days ago. Interventions attempted: Prescription medications: took extra dose. Symptoms are: gradually worsening.  Triage Disposition: See Physician Within 24 Hours  Patient/caregiver understands and will follow disposition?: Yes  Copied from CRM [id number]. Topic: Clinical - Red Word Triage >> Mar 02, 2024 12:48 PM Selinda RAMAN wrote: Red Word that prompted transfer to Nurse Triage: The patient called in stating his anxiety level is through the roof as he has been wrongfully evicted and he can't even get his stuff that is there. He has been taking more clonazePAM  (KLONOPIN ) 1 MG tablet that has been prescribed as well. He also complains of chest pain due to his anxiety. I will transfer him to E2C2 NT Reason for Disposition  Patient sounds very upset or troubled to the triager  Answer Assessment - Initial Assessment Questions 1. CONCERN: Did anything happen that prompted you to call today?      Increased anxiety   2. ANXIETY SYMPTOMS: Can you describe how you (your loved one; patient) have been feeling? (e.g., tense, restless, panicky, anxious, keyed up, overwhelmed, sense of impending doom).      Panicky, anxious  3. ONSET: How long have you been feeling this way? (e.g., hours, days, weeks)     Chronic  4. SEVERITY: How would you rate the level of anxiety? (e.g., 0 - 10; or mild, moderate, severe).     Severe-took an extra klonopin  before court today. Had alcohol last night.   5. FUNCTIONAL IMPAIRMENT: How have these feelings affected your ability to do daily activities? Have you had more difficulty than usual doing your normal daily activities? (e.g., getting better, same, worse; self-care, school, work, interactions)     Worse  6. HISTORY: Have you felt this way before? Have you ever been  diagnosed with an anxiety problem in the past? (e.g., generalized anxiety disorder, panic attacks, PTSD). If Yes, ask: How was this problem treated? (e.g., medicines, counseling, etc.)     Yes  7. RISK OF HARM - SUICIDAL IDEATION: Do you ever have thoughts of hurting or killing yourself? If Yes, ask:  Do you have these feelings now? Do you have a plan on how you would do this?     Lately feels like he may hurt himself, one unsuccessful attempt. No thought to harm self in this moment, states he will call 911 if SI recurs.   8. TREATMENT:  What has been done so far to treat this anxiety? (e.g., medicines, relaxation strategies). What has helped?     Took extra Klonopin .   9. POTENTIAL TRIGGERS: Do you drink caffeinated beverages (e.g., coffee, colas, teas), and how much daily? Do you drink alcohol or use any drugs? Have you started any new medicines recently?       Legal troubles, altercation with neighbors  10. PATIENT SUPPORT: Who is with you now? Who do you live with? Do you have family or friends who you can talk to?        Homeless at this time  44. OTHER SYMPTOMS: Do you have any other symptoms? (e.g., feeling depressed, trouble concentrating, trouble sleeping, trouble breathing, palpitations or fast heartbeat, chest pain, sweating, nausea, or diarrhea)        Additional info: On break at court house during call. Having issues with neighbors.  Protocols used: Anxiety and Panic Attack-A-AH

## 2024-03-03 ENCOUNTER — Encounter: Payer: Self-pay | Admitting: Internal Medicine

## 2024-03-03 ENCOUNTER — Ambulatory Visit: Payer: Self-pay | Admitting: Internal Medicine

## 2024-03-03 VITALS — BP 131/74 | HR 71 | Ht 66.0 in | Wt 224.2 lb

## 2024-03-03 DIAGNOSIS — F411 Generalized anxiety disorder: Secondary | ICD-10-CM

## 2024-03-03 DIAGNOSIS — F331 Major depressive disorder, recurrent, moderate: Secondary | ICD-10-CM

## 2024-03-03 DIAGNOSIS — F410 Panic disorder [episodic paroxysmal anxiety] without agoraphobia: Secondary | ICD-10-CM

## 2024-03-03 MED ORDER — HYDROXYZINE PAMOATE 25 MG PO CAPS: 25.0000 mg | ORAL_CAPSULE | Freq: Three times a day (TID) | ORAL | 1 refills | Status: AC | PRN

## 2024-03-03 NOTE — Patient Instructions (Signed)
 Please take Hydroxyzine  as prescribed for anxiety.  Please do not take more than 1 mg of Clonazepam  in a day.  Please continue taking Zoloft  as prescribed.

## 2024-03-03 NOTE — Progress Notes (Signed)
 Acute Office Visit  Subjective:    Patient ID: Gary Thompson, male    DOB: 1970/10/03, 53 y.o.   MRN: [id number]  Chief Complaint  Patient presents with   Anxiety    Pt reports sx of anxiety , panic attack.     HPI Patient is in today for recent worsening of anxiety and panic episodes since separating from his girlfriend. He had to leave her place due to a court order, where his belongings are still at. He is worried if he would be able to get his stuff back from her place. He is undergoing legal proceeding for it. He is unable to find a job as he does not have his tools (at his girlfriend's place).  MDD and panic disorder: He has been more stressed and anxious due to his recent breakup with his girlfriend. He used to stay with her and was told to leave due to arguments with her. He had noticed improvement in his symptoms since taking Zoloft , but has had anxiety spells are worse recently due to breakup with his girlfriend. He takes Klonopin  as needed at bedtime for severe anxiety, but has had panic episodes during the daytime.  Past Medical History:  Diagnosis Date   Anxiety    Arthritis    Depression    Diabetes mellitus without complication (HCC)    Hyperlipidemia    Hypertension    Sleep apnea    Vertigo     Past Surgical History:  Procedure Laterality Date   CHOLECYSTECTOMY N/A 08/11/2020   Procedure: LAPAROSCOPIC CHOLECYSTECTOMY;  Surgeon: Kallie Manuelita BROCKS, MD;  Location: [facility];  Service: General;  Laterality: N/A;   GALLBLADDER SURGERY     KNEE SURGERY     UMBILICAL HERNIA REPAIR N/A 08/11/2020   Procedure: HERNIA REPAIR UMBILICAL ADULT;  Surgeon: Kallie Manuelita BROCKS, MD;  Location: [facility];  Service: General;  Laterality: N/A;    Family History  Problem Relation Age of Onset   Cancer Mother    Hypertension Mother    Diabetes Mother    Colon polyps Father    Cancer Father    Hypertension Father    Diabetes Father    Colon cancer Paternal Grandmother         not sure of age of onset or death   Cancer Paternal Grandmother    COPD Paternal Grandfather    Esophageal cancer Neg Hx    Rectal cancer Neg Hx    Stomach cancer Neg Hx     Social History   Socioeconomic History   Marital status: Divorced    Spouse name: Not on file   Number of children: 0   Years of education: Not on file   Highest education level: GED or equivalent  Occupational History   Not on file  Tobacco Use   Smoking status: Never   Smokeless tobacco: Never  Vaping Use   Vaping status: Never Used  Substance and Sexual Activity   Alcohol use: Yes    Comment: occ; usually less than 1 per week   Drug use: Not Currently   Sexual activity: Yes  Other Topics Concern   Not on file  Social History Narrative   Not on file   Social Drivers of Health   Financial Resource Strain: High Risk (09/19/2023)   Overall Financial Resource Strain (CARDIA)    Difficulty of Paying Living Expenses: Very hard  Food Insecurity: Food Insecurity Present (09/19/2023)   Hunger Vital Sign    Worried About  Running Out of Food in the Last Year: Sometimes true    Ran Out of Food in the Last Year: Sometimes true  Transportation Needs: No Transportation Needs (09/19/2023)   PRAPARE - Administrator, Civil Service (Medical): No    Lack of Transportation (Non-Medical): No  Physical Activity: Sufficiently Active (07/14/2023)   Exercise Vital Sign    Days of Exercise per Week: 5 days    Minutes of Exercise per Session: 150+ min  Stress: Stress Concern Present (09/19/2023)   [employer] of Occupational Health - Occupational Stress Questionnaire    Feeling of Stress : Very much  Social Connections: Socially Isolated (09/19/2023)   Social Connection and Isolation Panel    Frequency of Communication with Friends and Family: More than three times a week    Frequency of Social Gatherings with Friends and Family: Once a week    Attends Religious Services: Never    Doctor, general practice or Organizations: No    Attends Engineer, structural: Not on file    Marital Status: Divorced  Catering manager Violence: Not on file    Outpatient Medications Prior to Visit  Medication Sig Dispense Refill   Blood Glucose Monitoring Suppl (ACCU-CHEK GUIDE) w/Device KIT 1 kit by Does not apply route daily. 1 kit 0   celecoxib  (CELEBREX ) 200 MG capsule Take 1 capsule by mouth twice daily 60 capsule 0   clonazePAM  (KLONOPIN ) 1 MG tablet Take 1 tablet (1 mg total) by mouth daily as needed. 90 tablet 1   cyclobenzaprine  (FLEXERIL ) 5 MG tablet Take 1 tablet (5 mg total) by mouth 2 (two) times daily as needed for muscle spasms. 30 tablet 1   gabapentin  (NEURONTIN ) 300 MG capsule Take 1 capsule (300 mg total) by mouth at bedtime. 90 capsule 1   glucose blood (ACCU-CHEK GUIDE) test strip Use as instructed 100 each 12   lisinopril  (ZESTRIL ) 2.5 MG tablet Take 1 tablet (2.5 mg total) by mouth daily. 30 tablet 5   omeprazole  (PRILOSEC) 40 MG capsule Take 1 capsule (40 mg total) by mouth daily. 90 capsule 3   rosuvastatin  (CRESTOR ) 10 MG tablet Take 1 tablet (10 mg total) by mouth daily. 90 tablet 1   Semaglutide , 2 MG/DOSE, (OZEMPIC , 2 MG/DOSE,) 8 MG/3ML SOPN Inject 2 mg into the skin every 7 (seven) days. 3 mL 5   sertraline  (ZOLOFT ) 100 MG tablet Take 1 tablet (100 mg total) by mouth daily. 90 tablet 1   sildenafil  (VIAGRA ) 50 MG tablet Take 1 tablet (50 mg total) by mouth daily as needed for erectile dysfunction. 10 tablet 2   diazepam  (VALIUM ) 5 MG tablet Take one tablet by mouth with food one hour prior to procedure. 1 tablet 0   No facility-administered medications prior to visit.    Allergies  Allergen Reactions   Dm-Doxylamine-Acetaminophen  Hives and Shortness Of Breath    Nyquill and Vicks 44-D Hives States difficulty breathing    Dextromethorphan    Vicks Dayquil-Nyquil Cld & Flu [Pe-Dm-Apap & Doxylamin-Dm-Apap] Other (See Comments)    overly sedated feeling'     Review of Systems  Constitutional:  Negative for chills and fever.  HENT:  Negative for congestion and sore throat.   Eyes:  Negative for pain and discharge.  Respiratory:  Negative for cough and shortness of breath.   Cardiovascular:  Negative for chest pain and palpitations.  Gastrointestinal:  Negative for diarrhea, nausea and vomiting.  Endocrine: Negative for polydipsia and polyuria.  Genitourinary:  Negative for dysuria and hematuria.  Musculoskeletal:  Positive for neck pain. Negative for neck stiffness.  Skin:  Negative for rash.  Neurological:  Negative for dizziness, weakness, numbness and headaches.  Psychiatric/Behavioral:  Positive for sleep disturbance. Negative for agitation and behavioral problems. The patient is nervous/anxious.        Objective:    Physical Exam Vitals reviewed.  Constitutional:      General: He is not in acute distress.    Appearance: He is obese. He is not diaphoretic.  HENT:     Head: Normocephalic and atraumatic.     Nose: Nose normal.     Mouth/Throat:     Mouth: Mucous membranes are moist.  Eyes:     General: No scleral icterus.    Extraocular Movements: Extraocular movements intact.  Cardiovascular:     Rate and Rhythm: Normal rate and regular rhythm.     Heart sounds: Normal heart sounds. No murmur heard. Pulmonary:     Breath sounds: Normal breath sounds. No wheezing or rales.  Musculoskeletal:     Cervical back: Neck supple. Tenderness present.     Right lower leg: No edema.     Left lower leg: No edema.  Skin:    General: Skin is warm.     Findings: No rash.  Neurological:     General: No focal deficit present.     Mental Status: He is alert and oriented to person, place, and time.     Cranial Nerves: No cranial nerve deficit.     Sensory: Sensory deficit (Left hand - 4th and 5th digits) present.     Motor: No weakness.  Psychiatric:        Mood and Affect: Mood is anxious.        Behavior: Behavior is cooperative.      BP 131/74   Pulse 71   Ht 5' 6 (1.676 m)   Wt 224 lb 3.2 oz (101.7 kg)   SpO2 98%   BMI 36.19 kg/m  Wt Readings from Last 3 Encounters:  03/03/24 224 lb 3.2 oz (101.7 kg)  12/26/23 245 lb 6.4 oz (111.3 kg)  12/05/23 241 lb 3.2 oz (109.4 kg)        Assessment & Plan:   Problem List Items Addressed This Visit       Other   Panic disorder   Takes Clonazepam  1 mg once daily PRN, would avoid increasing frequency for now - advised to take 0.5 mg BID PRN if he tolerates it better that way for now Added Vistaril  25 mg every 8 hours as needed for anxiety      Relevant Medications   hydrOXYzine  (VISTARIL ) 25 MG capsule   Moderate episode of recurrent major depressive disorder (HCC)   Uncontrolled with Zoloft  100 mg QD, but his school related stress and his current relationship also contributing to anxiety If persistent worsening of depression, will try to increase dose of Zoloft  Has Klonopin  as needed for panic episode      Relevant Medications   hydrOXYzine  (VISTARIL ) 25 MG capsule   GAD (generalized anxiety disorder) - Primary   Uncontrolled due to recent break-up with his girlfriend On Zoloft  100 mg QD, continue for now Continue clonazepam  1 mg QD as needed for panic episode Added Vistaril  25 mg every 8 hours as needed for anxiety      Relevant Medications   hydrOXYzine  (VISTARIL ) 25 MG capsule     Meds ordered this encounter  Medications  hydrOXYzine  (VISTARIL ) 25 MG capsule    Sig: Take 1 capsule (25 mg total) by mouth every 8 (eight) hours as needed.    Dispense:  60 capsule    Refill:  1     Shalimar Mcclain MARLA Blanch, MD

## 2024-03-06 NOTE — Assessment & Plan Note (Signed)
 Uncontrolled with Zoloft  100 mg QD, but his school related stress and his current relationship also contributing to anxiety If persistent worsening of depression, will try to increase dose of Zoloft  Has Klonopin  as needed for panic episode

## 2024-03-06 NOTE — Assessment & Plan Note (Signed)
 Uncontrolled due to recent break-up with his girlfriend On Zoloft  100 mg QD, continue for now Continue clonazepam  1 mg QD as needed for panic episode Added Vistaril  25 mg every 8 hours as needed for anxiety

## 2024-03-06 NOTE — Assessment & Plan Note (Signed)
 Takes Clonazepam  1 mg once daily PRN, would avoid increasing frequency for now - advised to take 0.5 mg BID PRN if he tolerates it better that way for now Added Vistaril  25 mg every 8 hours as needed for anxiety

## 2024-04-03 ENCOUNTER — Encounter (INDEPENDENT_AMBULATORY_CARE_PROVIDER_SITE_OTHER): Payer: 59 | Admitting: Ophthalmology

## 2024-04-07 ENCOUNTER — Other Ambulatory Visit: Payer: Self-pay | Admitting: Internal Medicine

## 2024-04-07 DIAGNOSIS — N529 Male erectile dysfunction, unspecified: Secondary | ICD-10-CM

## 2024-04-29 ENCOUNTER — Ambulatory Visit: Admitting: Internal Medicine

## 2024-05-01 ENCOUNTER — Encounter (INDEPENDENT_AMBULATORY_CARE_PROVIDER_SITE_OTHER): Admitting: Ophthalmology

## 2024-05-07 ENCOUNTER — Other Ambulatory Visit: Payer: Self-pay | Admitting: Internal Medicine

## 2024-05-07 DIAGNOSIS — M5412 Radiculopathy, cervical region: Secondary | ICD-10-CM

## 2024-05-19 ENCOUNTER — Other Ambulatory Visit: Payer: Self-pay | Admitting: Internal Medicine

## 2024-05-19 DIAGNOSIS — N529 Male erectile dysfunction, unspecified: Secondary | ICD-10-CM

## 2024-05-19 DIAGNOSIS — M5412 Radiculopathy, cervical region: Secondary | ICD-10-CM

## 2024-05-28 ENCOUNTER — Ambulatory Visit: Admitting: Internal Medicine

## 2024-05-28 ENCOUNTER — Other Ambulatory Visit (HOSPITAL_COMMUNITY): Payer: Self-pay

## 2024-05-28 ENCOUNTER — Telehealth: Payer: Self-pay | Admitting: Pharmacy Technician

## 2024-05-28 ENCOUNTER — Encounter: Payer: Self-pay | Admitting: Internal Medicine

## 2024-05-28 VITALS — BP 152/86 | HR 80 | Ht 66.0 in | Wt 242.2 lb

## 2024-05-28 DIAGNOSIS — E1159 Type 2 diabetes mellitus with other circulatory complications: Secondary | ICD-10-CM | POA: Diagnosis not present

## 2024-05-28 DIAGNOSIS — Z7985 Long-term (current) use of injectable non-insulin antidiabetic drugs: Secondary | ICD-10-CM

## 2024-05-28 DIAGNOSIS — E1169 Type 2 diabetes mellitus with other specified complication: Secondary | ICD-10-CM | POA: Diagnosis not present

## 2024-05-28 DIAGNOSIS — F410 Panic disorder [episodic paroxysmal anxiety] without agoraphobia: Secondary | ICD-10-CM

## 2024-05-28 DIAGNOSIS — Z23 Encounter for immunization: Secondary | ICD-10-CM

## 2024-05-28 DIAGNOSIS — F411 Generalized anxiety disorder: Secondary | ICD-10-CM

## 2024-05-28 DIAGNOSIS — I152 Hypertension secondary to endocrine disorders: Secondary | ICD-10-CM

## 2024-05-28 MED ORDER — SEMAGLUTIDE (1 MG/DOSE) 4 MG/3ML ~~LOC~~ SOPN: 1.0000 mg | PEN_INJECTOR | SUBCUTANEOUS | 3 refills | Status: DC

## 2024-05-28 MED ORDER — LISINOPRIL 5 MG PO TABS: 5.0000 mg | ORAL_TABLET | Freq: Every day | ORAL | 5 refills | Status: DC

## 2024-05-28 NOTE — Assessment & Plan Note (Addendum)
 Uncontrolled due to recent break-up with his girlfriend On Zoloft  100 mg QD, continue for now Continue clonazepam  1 mg QD as needed for panic episode Had added Vistaril  25 mg every 8 hours as needed for anxiety in the last visit

## 2024-05-28 NOTE — Assessment & Plan Note (Signed)
 Takes Clonazepam  1 mg once daily PRN, would avoid increasing frequency for now - advised to take 0.5 mg BID PRN if he tolerates it better that way for now Added Vistaril  25 mg every 8 hours as needed for anxiety

## 2024-05-28 NOTE — Telephone Encounter (Signed)
 Pharmacy Patient Advocate Encounter  Received notification from [employer] that Prior Authorization for Ozempic  (1 MG/DOSE) 4MG /3ML pen-injectors has been APPROVED from 05/28/2024 to 05/28/2025. Ran test claim, Copay is $4.00. This test claim was processed through [facility]- copay amounts may vary at other pharmacies due to pharmacy/plan contracts, or as the patient moves through the different stages of their insurance plan.   PA #/Case ID/Reference #: [id number]

## 2024-05-28 NOTE — Telephone Encounter (Signed)
 Pharmacy Patient Advocate Encounter   Received notification from CoverMyMeds that prior authorization for Ozempic  (1 MG/DOSE) 4MG /3ML pen-injectors is required/requested.   Insurance verification completed.   The patient is insured through [employer].   Per test claim: PA required; PA submitted to above mentioned insurance via Latent Key/confirmation #/EOC [id number] Status is pending

## 2024-05-28 NOTE — Progress Notes (Signed)
 Established Patient Office Visit  Subjective:  Patient ID: Gary Thompson, male    DOB: 1970/09/24  Age: 53 y.o. MRN: [id number]  CC:  Chief Complaint  Patient presents with   Hypertension    Has been dizzy again from bp medication.    Diabetes    F/u     HPI Gary Thompson is a 53 y.o. male with past medical history of type II DM, HTN, HLD, panic disorder and OSA who presents for f/u of his chronic medical conditions.  Type II DM: He used to take Ozempic  2 mg qw, but had to stop it due to insurance coverage gap.  He had stopped taking metformin  as he was having diarrhea and abdominal pain with it.  He has been not been checking his blood glucose recently.  His last HbA1c was 6.0 in 04/25 when he was taking Ozempic .  Denies any polyuria or polyphagia.  He is trying to improve his diet.  HTN: His BP was elevated today.  He was placed on lisinopril  2.5 mg once daily, but has stopped taking it due to intermittent dizziness.  Denies any headache, dizziness, chest pain, dyspnea or palpitations currently.  MDD and panic disorder: He has been more stressed and anxious due to his current relationship with his girlfriend and his school (HVAC course).  He had to leave her place due to arguments with her.  He has moved to camper home recently. He takes Zoloft  100 mg QD. He takes Klonopin  as needed for severe anxiety.  He was given hydroxyzine  in the last visit for as needed use.  Neck pain: He reports chronic, intermittent neck pain, radiating towards left shoulder, associated with numbness and tingling of left LE.  He has tried a Scientist, product/process development powder with some relief. His x-ray of cervical spine showed spondylosis.  He saw physiatry clinic for it, and had MRI of cervical spine, but has not had follow up with them since the MRI of cervical spine.  Past Medical History:  Diagnosis Date   Anxiety    Arthritis    Depression    Diabetes mellitus without complication (HCC)    Hyperlipidemia     Hypertension    Sleep apnea    Vertigo     Past Surgical History:  Procedure Laterality Date   CHOLECYSTECTOMY N/A 08/11/2020   Procedure: LAPAROSCOPIC CHOLECYSTECTOMY;  Surgeon: Kallie Manuelita BROCKS, MD;  Location: [facility];  Service: General;  Laterality: N/A;   GALLBLADDER SURGERY     KNEE SURGERY     UMBILICAL HERNIA REPAIR N/A 08/11/2020   Procedure: HERNIA REPAIR UMBILICAL ADULT;  Surgeon: Kallie Manuelita BROCKS, MD;  Location: [facility];  Service: General;  Laterality: N/A;    Family History  Problem Relation Age of Onset   Cancer Mother    Hypertension Mother    Diabetes Mother    Colon polyps Father    Cancer Father    Hypertension Father    Diabetes Father    Colon cancer Paternal Grandmother        not sure of age of onset or death   Cancer Paternal Grandmother    COPD Paternal Grandfather    Esophageal cancer Neg Hx    Rectal cancer Neg Hx    Stomach cancer Neg Hx     Social History   Socioeconomic History   Marital status: Divorced    Spouse name: Not on file   Number of children: 0   Years of education: Not on file  Highest education level: GED or equivalent  Occupational History   Not on file  Tobacco Use   Smoking status: Never   Smokeless tobacco: Never  Vaping Use   Vaping status: Never Used  Substance and Sexual Activity   Alcohol use: Yes    Comment: occ; usually less than 1 per week   Drug use: Not Currently   Sexual activity: Yes  Other Topics Concern   Not on file  Social History Narrative   Not on file   Social Drivers of Health   Financial Resource Strain: High Risk (05/27/2024)   Overall Financial Resource Strain (CARDIA)    Difficulty of Paying Living Expenses: Very hard  Food Insecurity: Food Insecurity Present (05/27/2024)   Hunger Vital Sign    Worried About Running Out of Food in the Last Year: Often true    Ran Out of Food in the Last Year: Often true  Transportation Needs: No Transportation Needs (05/27/2024)   PRAPARE -  Administrator, Civil Service (Medical): No    Lack of Transportation (Non-Medical): No  Physical Activity: Unknown (05/27/2024)   Exercise Vital Sign    Days of Exercise per Week: 4 days    Minutes of Exercise per Session: Patient declined  Stress: Stress Concern Present (05/27/2024)   [employer] of Occupational Health - Occupational Stress Questionnaire    Feeling of Stress: Very much  Social Connections: Socially Isolated (05/27/2024)   Social Connection and Isolation Panel    Frequency of Communication with Friends and Family: More than three times a week    Frequency of Social Gatherings with Friends and Family: Once a week    Attends Religious Services: Patient declined    Database administrator or Organizations: No    Attends Engineer, structural: Not on file    Marital Status: Divorced  Intimate Partner Violence: Not on file    Outpatient Medications Prior to Visit  Medication Sig Dispense Refill   Blood Glucose Monitoring Suppl (ACCU-CHEK GUIDE) w/Device KIT 1 kit by Does not apply route daily. 1 kit 0   celecoxib  (CELEBREX ) 200 MG capsule Take 1 capsule by mouth twice daily 60 capsule 2   clonazePAM  (KLONOPIN ) 1 MG tablet Take 1 tablet (1 mg total) by mouth daily as needed. 90 tablet 1   cyclobenzaprine  (FLEXERIL ) 5 MG tablet Take 1 tablet (5 mg total) by mouth 2 (two) times daily as needed for muscle spasms. 30 tablet 1   gabapentin  (NEURONTIN ) 300 MG capsule Take 1 capsule by mouth at bedtime 90 capsule 0   glucose blood (ACCU-CHEK GUIDE) test strip Use as instructed 100 each 12   hydrOXYzine  (VISTARIL ) 25 MG capsule Take 1 capsule (25 mg total) by mouth every 8 (eight) hours as needed. 60 capsule 1   omeprazole  (PRILOSEC) 40 MG capsule Take 1 capsule (40 mg total) by mouth daily. 90 capsule 3   rosuvastatin  (CRESTOR ) 10 MG tablet Take 1 tablet (10 mg total) by mouth daily. 90 tablet 1   sertraline  (ZOLOFT ) 100 MG tablet Take 1 tablet (100 mg total)  by mouth daily. 90 tablet 1   sildenafil  (VIAGRA ) 50 MG tablet TAKE 1 TABLET BY MOUTH ONCE DAILY AS NEEDED FOR ERECTILE DYSFUNCTION 10 tablet 0   lisinopril  (ZESTRIL ) 2.5 MG tablet Take 1 tablet (2.5 mg total) by mouth daily. 30 tablet 5   Semaglutide , 2 MG/DOSE, (OZEMPIC , 2 MG/DOSE,) 8 MG/3ML SOPN Inject 2 mg into the skin every 7 (seven) days.  3 mL 5   No facility-administered medications prior to visit.    Allergies  Allergen Reactions   Dm-Doxylamine-Acetaminophen  Hives and Shortness Of Breath    Nyquill and Vicks 44-D Hives States difficulty breathing    Dextromethorphan    Vicks Dayquil-Nyquil Cld & Flu [Pe-Dm-Apap & Doxylamin-Dm-Apap] Other (See Comments)    overly sedated feeling'    ROS Review of Systems  Constitutional:  Negative for chills and fever.  HENT:  Negative for congestion and sore throat.   Eyes:  Negative for pain and discharge.  Respiratory:  Negative for cough and shortness of breath.   Cardiovascular:  Negative for chest pain and palpitations.  Gastrointestinal:  Negative for diarrhea, nausea and vomiting.  Endocrine: Negative for polydipsia and polyuria.  Genitourinary:  Negative for dysuria and hematuria.  Musculoskeletal:  Positive for neck pain. Negative for neck stiffness.  Skin:  Negative for rash.  Neurological:  Negative for dizziness, weakness, numbness and headaches.  Psychiatric/Behavioral:  Positive for sleep disturbance. Negative for agitation and behavioral problems. The patient is nervous/anxious.       Objective:    Physical Exam Vitals reviewed.  Constitutional:      General: He is not in acute distress.    Appearance: He is obese. He is not diaphoretic.  HENT:     Head: Normocephalic and atraumatic.     Nose: Nose normal.     Mouth/Throat:     Mouth: Mucous membranes are moist.  Eyes:     General: No scleral icterus.    Extraocular Movements: Extraocular movements intact.  Cardiovascular:     Rate and Rhythm: Normal  rate and regular rhythm.     Heart sounds: Normal heart sounds. No murmur heard. Pulmonary:     Breath sounds: Normal breath sounds. No wheezing or rales.  Musculoskeletal:     Cervical back: Neck supple. Tenderness present.     Right lower leg: No edema.     Left lower leg: No edema.  Skin:    General: Skin is warm.     Findings: No rash.  Neurological:     General: No focal deficit present.     Mental Status: He is alert and oriented to person, place, and time.     Cranial Nerves: No cranial nerve deficit.     Sensory: Sensory deficit (Left hand - 4th and 5th digits) present.     Motor: No weakness.  Psychiatric:        Mood and Affect: Mood is anxious.        Behavior: Behavior is cooperative.     BP (!) 152/86 (BP Location: Right Arm)   Pulse 80   Ht 5' 6 (1.676 m)   Wt 242 lb 3.2 oz (109.9 kg)   SpO2 98%   BMI 39.09 kg/m  Wt Readings from Last 3 Encounters:  05/28/24 242 lb 3.2 oz (109.9 kg)  03/03/24 224 lb 3.2 oz (101.7 kg)  12/26/23 245 lb 6.4 oz (111.3 kg)    No results found for: TSH Lab Results  Component Value Date   WBC 11.6 (H) 01/24/2023   HGB 15.0 01/24/2023   HCT 45.3 01/24/2023   MCV 84 01/24/2023   PLT 237 01/24/2023   Lab Results  Component Value Date   NA 142 12/24/2023   K 4.2 12/24/2023   CO2 25 12/24/2023   GLUCOSE 112 (H) 12/24/2023   BUN 14 12/24/2023   CREATININE 1.35 (H) 12/24/2023   BILITOT 0.4 12/24/2023  ALKPHOS 71 12/24/2023   AST 14 12/24/2023   ALT 15 12/24/2023   PROT 6.1 12/24/2023   ALBUMIN 4.2 12/24/2023   CALCIUM  9.3 12/24/2023   ANIONGAP 10 08/17/2020   EGFR 64 12/24/2023   Lab Results  Component Value Date   CHOL 100 12/24/2023   Lab Results  Component Value Date   HDL 39 (L) 12/24/2023   Lab Results  Component Value Date   LDLCALC 45 12/24/2023   Lab Results  Component Value Date   TRIG 74 12/24/2023   Lab Results  Component Value Date   CHOLHDL 2.6 12/24/2023   Lab Results  Component  Value Date   HGBA1C 6.0 (H) 12/24/2023      Assessment & Plan:   Problem List Items Addressed This Visit       Cardiovascular and Mediastinum   Hypertension associated with diabetes (HCC) - Primary   BP Readings from Last 1 Encounters:  05/28/24 (!) 152/86   Uncontrolled due to noncompliance DCed amlodipine  and olmesartan  due to hypotension previously Restart lisinopril  at 5 mg QD considering h/o DM - advised to increase fluid intake to at least 64 ounces in a day Counseled for compliance with the medications Advised DASH diet and moderate exercise/walking, at least 150 mins/week      Relevant Medications   Semaglutide , 1 MG/DOSE, 4 MG/3ML SOPN   lisinopril  (ZESTRIL ) 5 MG tablet     Endocrine   Type 2 diabetes mellitus with other specified complication (HCC)   Lab Results  Component Value Date   HGBA1C 6.0 (H) 12/24/2023   Has h/o uncontrolled DM, was 10.4 in 12/23 Was well-controlled with Ozempic , but had to stop due to insurance coverage concern Associated with HTN and HLD Restart at Ozempic  1 mg qw now Did not tolerate metformin  - had GI side effects (diarrhea) Used to be on insulin  in the past Advised to follow diabetic diet On ARB and statin F/u CMP and lipid panel Diabetic eye exam: Advised to follow up with Ophthalmology for diabetic eye exam      Relevant Medications   Semaglutide , 1 MG/DOSE, 4 MG/3ML SOPN   lisinopril  (ZESTRIL ) 5 MG tablet   Other Relevant Orders   CMP14+EGFR   Hemoglobin A1c     Other   Panic disorder   Takes Clonazepam  1 mg once daily PRN, would avoid increasing frequency for now - advised to take 0.5 mg BID PRN if he tolerates it better that way for now Added Vistaril  25 mg every 8 hours as needed for anxiety      GAD (generalized anxiety disorder)   Uncontrolled due to recent break-up with his girlfriend On Zoloft  100 mg QD, continue for now Continue clonazepam  1 mg QD as needed for panic episode Had added Vistaril  25 mg every  8 hours as needed for anxiety in the last visit      Other Visit Diagnoses       Encounter for immunization       Relevant Orders   Flu vaccine trivalent PF, 6mos and older(Flulaval,Afluria,Fluarix,Fluzone) (Completed)        Meds ordered this encounter  Medications   Semaglutide , 1 MG/DOSE, 4 MG/3ML SOPN    Sig: Inject 1 mg as directed once a week.    Dispense:  3 mL    Refill:  3   lisinopril  (ZESTRIL ) 5 MG tablet    Sig: Take 1 tablet (5 mg total) by mouth daily.    Dispense:  30 tablet  Refill:  5    Follow-up: Return in about 3 months (around 08/28/2024) for DM and HTN.    Suzzane MARLA Blanch, MD

## 2024-05-28 NOTE — Assessment & Plan Note (Addendum)
 BP Readings from Last 1 Encounters:  05/28/24 (!) 152/86   Uncontrolled due to noncompliance DCed amlodipine  and olmesartan  due to hypotension previously Restart lisinopril  at 5 mg QD considering h/o DM - advised to increase fluid intake to at least 64 ounces in a day Counseled for compliance with the medications Advised DASH diet and moderate exercise/walking, at least 150 mins/week

## 2024-05-28 NOTE — Patient Instructions (Addendum)
 Please start taking Lisinopril  5 mg once daily.  Please start taking Ozempic  1 mg once weekly as prescribed.  Please continue to take medications as prescribed.  Please continue to follow low carb diet and perform moderate exercise/walking at least 150 mins/week.

## 2024-05-28 NOTE — Assessment & Plan Note (Addendum)
 Lab Results  Component Value Date   HGBA1C 6.0 (H) 12/24/2023   Has h/o uncontrolled DM, was 10.4 in 12/23 Was well-controlled with Ozempic , but had to stop due to insurance coverage concern Associated with HTN and HLD Restart at Ozempic  1 mg qw now Did not tolerate metformin  - had GI side effects (diarrhea) Used to be on insulin  in the past Advised to follow diabetic diet On ARB and statin F/u CMP and lipid panel Diabetic eye exam: Advised to follow up with Ophthalmology for diabetic eye exam

## 2024-05-29 ENCOUNTER — Ambulatory Visit: Payer: Self-pay | Admitting: Internal Medicine

## 2024-05-29 LAB — HEMOGLOBIN A1C
Est. average glucose Bld gHb Est-mCnc: 128 mg/dL
Hgb A1c MFr Bld: 6.1 % — ABNORMAL HIGH (ref 4.8–5.6)

## 2024-05-29 LAB — CMP14+EGFR
ALT: 16 IU/L (ref 0–44)
AST: 17 IU/L (ref 0–40)
Albumin: 4.5 g/dL (ref 3.8–4.9)
Alkaline Phosphatase: 92 IU/L (ref 47–123)
BUN/Creatinine Ratio: 13 (ref 9–20)
BUN: 17 mg/dL (ref 6–24)
Bilirubin Total: 0.4 mg/dL (ref 0.0–1.2)
CO2: 24 mmol/L (ref 20–29)
Calcium: 9 mg/dL (ref 8.7–10.2)
Chloride: 100 mmol/L (ref 96–106)
Creatinine, Ser: 1.27 mg/dL (ref 0.76–1.27)
Globulin, Total: 2.1 g/dL (ref 1.5–4.5)
Glucose: 163 mg/dL — ABNORMAL HIGH (ref 70–99)
Potassium: 4.3 mmol/L (ref 3.5–5.2)
Sodium: 140 mmol/L (ref 134–144)
Total Protein: 6.6 g/dL (ref 6.0–8.5)
eGFR: 68 mL/min/1.73 (ref >59)

## 2024-06-19 ENCOUNTER — Other Ambulatory Visit: Payer: Self-pay

## 2024-06-19 DIAGNOSIS — M5412 Radiculopathy, cervical region: Secondary | ICD-10-CM

## 2024-06-19 MED ORDER — GABAPENTIN 300 MG PO CAPS: 300.0000 mg | ORAL_CAPSULE | Freq: Every day | ORAL | 0 refills | Status: DC

## 2024-06-21 ENCOUNTER — Other Ambulatory Visit: Payer: Self-pay | Admitting: Internal Medicine

## 2024-06-21 DIAGNOSIS — F410 Panic disorder [episodic paroxysmal anxiety] without agoraphobia: Secondary | ICD-10-CM

## 2024-06-29 ENCOUNTER — Encounter: Payer: Self-pay | Admitting: Radiology

## 2024-06-29 ENCOUNTER — Other Ambulatory Visit: Payer: Self-pay | Admitting: Internal Medicine

## 2024-06-29 DIAGNOSIS — F331 Major depressive disorder, recurrent, moderate: Secondary | ICD-10-CM

## 2024-06-29 DIAGNOSIS — F410 Panic disorder [episodic paroxysmal anxiety] without agoraphobia: Secondary | ICD-10-CM

## 2024-08-27 ENCOUNTER — Other Ambulatory Visit: Payer: Self-pay | Admitting: Internal Medicine

## 2024-08-27 DIAGNOSIS — F331 Major depressive disorder, recurrent, moderate: Secondary | ICD-10-CM

## 2024-08-28 ENCOUNTER — Ambulatory Visit: Admitting: Internal Medicine

## 2024-08-31 ENCOUNTER — Ambulatory Visit: Payer: Self-pay

## 2024-08-31 ENCOUNTER — Other Ambulatory Visit: Payer: Self-pay | Admitting: Internal Medicine

## 2024-08-31 DIAGNOSIS — E782 Mixed hyperlipidemia: Secondary | ICD-10-CM

## 2024-08-31 NOTE — Telephone Encounter (Signed)
 FYI Only or Action Required?: Action required by provider: request for appointment, clinical question for provider, and update on patient condition.  Patient was last seen in primary care on 05/28/2024 by Tobie Suzzane POUR, MD.  Called Nurse Triage reporting Depression.  Symptoms began several weeks ago.  Interventions attempted: Prescription medications: zoloft ; klonopin ; vistaril .  Symptoms are: gradually worsening.  Triage Disposition: See Physician Within 24 Hours  Patient/caregiver understands and will follow disposition?: Yes    Copied from CRM [id number]. Topic: Clinical - Red Word Triage >> Aug 31, 2024 12:14 PM Myrick T wrote: Red Word that prompted transfer to Nurse Triage: patient called to see if he could be seen today as he has been severally depressed and drained. He says the holidays has made his symptoms worse as he is alone. He is still trying to find somewhere to live and just feel down about missing his girlfriend.    Reason for Disposition  [1] Depression AND [2] getting worse (e.g., sleeping poorly, less able to do activities of daily living)  Answer Assessment - Initial Assessment Questions Pt called in requesting appt be moved today; pt is currently living out of state d/t court case and protection order place on him by his ex-girlfriend. Pt very upset and tearful, stating that he has lost everything due to her accusations. Pt living in [place] with his father. Pt states that he is currently living out of town and will be leaving today so unable to keep appt for tomorrow. Pt has been fasting today if appt is available as he states he is due for blood work. Unable to reach [facility] d/t office closure for lunch. Reassured pt that I would send requested and verified phone number d/t several ongoing legal issues. Pt confirmed he could be reached at [phone].    1. CONCERN: What happened that made you call today?     Pt going through a lot of legal issues with ex-girlfriend;  today pt had court for two misdemeanors that he is appealing. Pt reports he was kicked out of his home and is currently living in [place] with his father which adds another level of stress.   2. DEPRESSION SYMPTOM SCREENING: How are you feeling overall? (e.g., decreased energy, increased sleeping or difficulty sleeping, difficulty concentrating, feelings of sadness, guilt, hopelessness, or worthlessness)     Decreased energy, difficulty sleeping, sadness; I just want to give up   3. RISK OF HARM - SUICIDAL IDEATION:  Do you ever have thoughts of hurting or killing yourself?  (e.g., yes, no, no but preoccupation with thoughts about death)     No   4. RISK OF HARM - HOMICIDAL IDEATION:  Do you ever have thoughts of hurting or killing someone else?  (e.g., yes, no, no but preoccupation with thoughts about death)     No   5. FUNCTIONAL IMPAIRMENT: How have things been going for you overall? Have you had more difficulty than usual doing your normal daily activities?  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 Pt states he can't even work for [employer] d/t background check. Feels like he has lost everything   6. SUPPORT: Who is with you now? Who do you live with? Do you have family or friends who you can talk to?      Staying with his father; currently in [place] for court  7. THERAPIST: Do you  have a counselor or therapist? If Yes, ask: What is their name?     Pt has referral to psychiatrist in [place] but has not heard from them. Provided him with office name and contact info.   8. STRESSORS: Has there been any new stress or recent changes in your life?     Several; court cases, ex-girlfriend, new living arrangement with his father out of state   78. ALCOHOL USE OR SUBSTANCE USE (DRUG USE): Do you drink alcohol or use any illegal drugs?     No   10. OTHER: Do you  have any other physical symptoms right now? (e.g., fever)       No  Protocols used: Depression-A-AH

## 2024-09-01 ENCOUNTER — Ambulatory Visit: Admitting: Internal Medicine

## 2024-09-04 ENCOUNTER — Encounter: Payer: Self-pay | Admitting: Internal Medicine

## 2024-09-04 ENCOUNTER — Ambulatory Visit: Payer: Self-pay | Admitting: Internal Medicine

## 2024-09-04 ENCOUNTER — Other Ambulatory Visit: Payer: Self-pay | Admitting: Internal Medicine

## 2024-09-04 VITALS — BP 121/71 | HR 75 | Ht 66.0 in | Wt 237.8 lb

## 2024-09-04 DIAGNOSIS — E559 Vitamin D deficiency, unspecified: Secondary | ICD-10-CM | POA: Diagnosis not present

## 2024-09-04 DIAGNOSIS — M5412 Radiculopathy, cervical region: Secondary | ICD-10-CM

## 2024-09-04 DIAGNOSIS — F331 Major depressive disorder, recurrent, moderate: Secondary | ICD-10-CM

## 2024-09-04 DIAGNOSIS — Z7985 Long-term (current) use of injectable non-insulin antidiabetic drugs: Secondary | ICD-10-CM | POA: Diagnosis not present

## 2024-09-04 DIAGNOSIS — F411 Generalized anxiety disorder: Secondary | ICD-10-CM

## 2024-09-04 DIAGNOSIS — E1169 Type 2 diabetes mellitus with other specified complication: Secondary | ICD-10-CM

## 2024-09-04 DIAGNOSIS — Z0001 Encounter for general adult medical examination with abnormal findings: Secondary | ICD-10-CM | POA: Diagnosis not present

## 2024-09-04 DIAGNOSIS — E782 Mixed hyperlipidemia: Secondary | ICD-10-CM | POA: Diagnosis not present

## 2024-09-04 DIAGNOSIS — E1159 Type 2 diabetes mellitus with other circulatory complications: Secondary | ICD-10-CM | POA: Diagnosis not present

## 2024-09-04 DIAGNOSIS — I152 Hypertension secondary to endocrine disorders: Secondary | ICD-10-CM

## 2024-09-04 DIAGNOSIS — Z125 Encounter for screening for malignant neoplasm of prostate: Secondary | ICD-10-CM | POA: Insufficient documentation

## 2024-09-04 MED ORDER — GABAPENTIN 300 MG PO CAPS: 300.0000 mg | ORAL_CAPSULE | Freq: Every day | ORAL | 0 refills | Status: AC

## 2024-09-04 MED ORDER — CELECOXIB 200 MG PO CAPS: 200.0000 mg | ORAL_CAPSULE | Freq: Two times a day (BID) | ORAL | 2 refills | Status: AC

## 2024-09-04 MED ORDER — LISINOPRIL 5 MG PO TABS: 5.0000 mg | ORAL_TABLET | Freq: Every day | ORAL | 3 refills | Status: AC

## 2024-09-04 MED ORDER — SEMAGLUTIDE (1 MG/DOSE) 4 MG/3ML ~~LOC~~ SOPN: 1.0000 mg | PEN_INJECTOR | SUBCUTANEOUS | 3 refills | Status: AC

## 2024-09-04 NOTE — Assessment & Plan Note (Signed)
 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

## 2024-09-04 NOTE — Assessment & Plan Note (Addendum)
 Neck pain with radicular symptoms likely due to cervical radiculopathy Checked x-ray of cervical spine, planned to get MRI of cervical spine -has been evaluated by Physiatry clinic now, but needs follow-up Avoid oral steroids due to DM Celebrex  as needed for pain On gabapentin  300 mg nightly due to neuropathic symptoms Flexeril  as needed for muscle spasms Heating pad and/or massage PRN

## 2024-09-04 NOTE — Progress Notes (Signed)
 "  Established Patient Office Visit  Subjective:  Patient ID: Gary Thompson, male    DOB: 09/11/1970  Age: 54 y.o. MRN: [id number]  CC:  Chief Complaint  Patient presents with   Depression    Sx of depression.   Annual Exam    HPI Gary Thompson is a 54 y.o. male with past medical history of type II DM, HTN, HLD, panic disorder and OSA who presents for f/u of his chronic medical conditions.  Type II DM: He has started taking Ozempic  2 mg qw, but had to stop it due to insurance coverage gap in between. His last HbA1c was 6.1 in 10/25 when he was taking Ozempic .  Denies any polyuria or polyphagia.  He is trying to improve his diet.  HTN: His BP was WNL today.  He was placed on lisinopril  5 mg once daily.  Denies any headache, dizziness, chest pain, dyspnea or palpitations currently.  MDD and panic disorder: He has been more stressed and anxious due to his break-up with his girlfriend, he has placed restraining orders on him that has cost him his job.  He had to leave her place due to arguments with her.  He has moved to camper home currently. He takes Zoloft  100 mg QD. He takes Klonopin  as needed for severe anxiety.  He was given hydroxyzine  in the last visit for as needed use.  Today, he reports mild improvement in depression as he has graduated from his HVAC course.  Neck pain: He reports chronic, intermittent neck pain, radiating towards left shoulder, associated with numbness and tingling of left LE.  He has tried Celebrex  with some relief. His x-ray of cervical spine showed spondylosis.  He saw physiatry clinic for it, and had MRI of cervical spine, but has not had follow up with them since the MRI of cervical spine.  Past Medical History:  Diagnosis Date   Anxiety    Arthritis    Depression    Diabetes mellitus without complication (HCC)    Hyperlipidemia    Hypertension    Sleep apnea    Vertigo     Past Surgical History:  Procedure Laterality Date   CHOLECYSTECTOMY N/A  08/11/2020   Procedure: LAPAROSCOPIC CHOLECYSTECTOMY;  Surgeon: Kallie Manuelita BROCKS, MD;  Location: [facility];  Service: General;  Laterality: N/A;   GALLBLADDER SURGERY     KNEE SURGERY     UMBILICAL HERNIA REPAIR N/A 08/11/2020   Procedure: HERNIA REPAIR UMBILICAL ADULT;  Surgeon: Kallie Manuelita BROCKS, MD;  Location: [facility];  Service: General;  Laterality: N/A;    Family History  Problem Relation Age of Onset   Cancer Mother    Hypertension Mother    Diabetes Mother    Colon polyps Father    Cancer Father    Hypertension Father    Diabetes Father    Colon cancer Paternal Grandmother        not sure of age of onset or death   Cancer Paternal Grandmother    COPD Paternal Grandfather    Esophageal cancer Neg Hx    Rectal cancer Neg Hx    Stomach cancer Neg Hx     Social History   Socioeconomic History   Marital status: Divorced    Spouse name: Not on file   Number of children: 0   Years of education: Not on file   Highest education level: GED or equivalent  Occupational History   Not on file  Tobacco Use   Smoking status:  Never   Smokeless tobacco: Never  Vaping Use   Vaping status: Never Used  Substance and Sexual Activity   Alcohol use: Yes    Comment: occ; usually less than 1 per week   Drug use: Not Currently   Sexual activity: Yes  Other Topics Concern   Not on file  Social History Narrative   Not on file   Social Drivers of Health   Tobacco Use: Low Risk (09/04/2024)   Patient History    Smoking Tobacco Use: Never    Smokeless Tobacco Use: Never    Passive Exposure: Not on file  Financial Resource Strain: High Risk (09/03/2024)   Overall Financial Resource Strain (CARDIA)    Difficulty of Paying Living Expenses: Very hard  Food Insecurity: Food Insecurity Present (09/03/2024)   Epic    Worried About Programme Researcher, Broadcasting/film/video in the Last Year: Often true    Ran Out of Food in the Last Year: Sometimes true  Transportation Needs: No Transportation Needs (09/03/2024)    Epic    Lack of Transportation (Medical): No    Lack of Transportation (Non-Medical): No  Physical Activity: Sufficiently Active (09/03/2024)   Exercise Vital Sign    Days of Exercise per Week: 7 days    Minutes of Exercise per Session: 30 min  Stress: Stress Concern Present (09/03/2024)   [employer] of Occupational Health - Occupational Stress Questionnaire    Feeling of Stress: Very much  Social Connections: Socially Isolated (09/03/2024)   Social Connection and Isolation Panel    Frequency of Communication with Friends and Family: More than three times a week    Frequency of Social Gatherings with Friends and Family: Twice a week    Attends Religious Services: Never    Database Administrator or Organizations: No    Attends Engineer, Structural: Not on file    Marital Status: Divorced  Intimate Partner Violence: Not on file  Depression (PHQ2-9): High Risk (09/04/2024)   Depression (PHQ2-9)    PHQ-2 Score: 14  Alcohol Screen: Low Risk (09/03/2024)   Alcohol Screen    Last Alcohol Screening Score (AUDIT): 4  Housing: High Risk (09/03/2024)   Epic    Unable to Pay for Housing in the Last Year: No    Number of Times Moved in the Last Year: 0    Homeless in the Last Year: Yes  Utilities: Not At Risk (01/30/2023)   [facility] Utilities    Threatened with loss of utilities: No  Health Literacy: Not on file    Outpatient Medications Prior to Visit  Medication Sig Dispense Refill   Blood Glucose Monitoring Suppl (ACCU-CHEK GUIDE) w/Device KIT 1 kit by Does not apply route daily. 1 kit 0   clonazePAM  (KLONOPIN ) 1 MG tablet TAKE 1 TABLET BY MOUTH ONCE DAILY AS NEEDED 30 tablet 3   cyclobenzaprine  (FLEXERIL ) 5 MG tablet Take 1 tablet (5 mg total) by mouth 2 (two) times daily as needed for muscle spasms. 30 tablet 1   glucose blood (ACCU-CHEK GUIDE) test strip Use as instructed 100 each 12   hydrOXYzine  (VISTARIL ) 25 MG capsule Take 1 capsule (25 mg total) by mouth every 8 (eight) hours as  needed. 60 capsule 1   omeprazole  (PRILOSEC) 40 MG capsule Take 1 capsule (40 mg total) by mouth daily. 90 capsule 3   rosuvastatin  (CRESTOR ) 10 MG tablet Take 1 tablet by mouth once daily 90 tablet 0   sertraline  (ZOLOFT ) 100 MG tablet Take 1 tablet  by mouth once daily 90 tablet 0   sildenafil  (VIAGRA ) 50 MG tablet TAKE 1 TABLET BY MOUTH ONCE DAILY AS NEEDED FOR ERECTILE DYSFUNCTION 10 tablet 0   celecoxib  (CELEBREX ) 200 MG capsule Take 1 capsule by mouth twice daily 60 capsule 2   gabapentin  (NEURONTIN ) 300 MG capsule Take 1 capsule (300 mg total) by mouth at bedtime. 90 capsule 0   lisinopril  (ZESTRIL ) 5 MG tablet Take 1 tablet (5 mg total) by mouth daily. 30 tablet 5   Semaglutide , 1 MG/DOSE, 4 MG/3ML SOPN Inject 1 mg as directed once a week. 3 mL 3   No facility-administered medications prior to visit.    Allergies  Allergen Reactions   Dm-Doxylamine-Acetaminophen  Hives and Shortness Of Breath    Nyquill and Vicks 44-D Hives States difficulty breathing    Dextromethorphan    Vicks Dayquil-Nyquil Cld & Flu [Pe-Dm-Apap & Doxylamin-Dm-Apap] Other (See Comments)    overly sedated feeling'    ROS Review of Systems  Constitutional:  Negative for chills and fever.  HENT:  Negative for congestion and sore throat.   Eyes:  Negative for pain and discharge.  Respiratory:  Negative for cough and shortness of breath.   Cardiovascular:  Negative for chest pain and palpitations.  Gastrointestinal:  Negative for diarrhea, nausea and vomiting.  Endocrine: Negative for polydipsia and polyuria.  Genitourinary:  Negative for dysuria and hematuria.  Musculoskeletal:  Positive for neck pain. Negative for neck stiffness.  Skin:  Negative for rash.  Neurological:  Negative for dizziness, weakness, numbness and headaches.  Psychiatric/Behavioral:  Positive for sleep disturbance. Negative for agitation and behavioral problems. The patient is nervous/anxious.       Objective:    Physical  Exam Vitals reviewed.  Constitutional:      General: He is not in acute distress.    Appearance: He is obese. He is not diaphoretic.  HENT:     Head: Normocephalic and atraumatic.     Nose: Nose normal.     Mouth/Throat:     Mouth: Mucous membranes are moist.  Eyes:     General: No scleral icterus.    Extraocular Movements: Extraocular movements intact.  Cardiovascular:     Rate and Rhythm: Normal rate and regular rhythm.     Heart sounds: Normal heart sounds. No murmur heard. Pulmonary:     Breath sounds: Normal breath sounds. No wheezing or rales.  Abdominal:     Palpations: Abdomen is soft.     Tenderness: There is no abdominal tenderness.  Musculoskeletal:     Cervical back: Neck supple. Tenderness present.     Right lower leg: No edema.     Left lower leg: No edema.  Skin:    General: Skin is warm.     Findings: No rash.  Neurological:     General: No focal deficit present.     Mental Status: He is alert and oriented to person, place, and time.     Cranial Nerves: No cranial nerve deficit.     Sensory: Sensory deficit (Left hand - 4th and 5th digits) present.     Motor: No weakness.  Psychiatric:        Mood and Affect: Mood is anxious.        Behavior: Behavior is cooperative.     BP 121/71   Pulse 75   Ht 5' 6 (1.676 m)   Wt 237 lb 12.8 oz (107.9 kg)   SpO2 97%   BMI 38.38 kg/m  Wt  Readings from Last 3 Encounters:  09/04/24 237 lb 12.8 oz (107.9 kg)  05/28/24 242 lb 3.2 oz (109.9 kg)  03/03/24 224 lb 3.2 oz (101.7 kg)    No results found for: TSH Lab Results  Component Value Date   WBC 11.6 (H) 01/24/2023   HGB 15.0 01/24/2023   HCT 45.3 01/24/2023   MCV 84 01/24/2023   PLT 237 01/24/2023   Lab Results  Component Value Date   NA 140 05/28/2024   K 4.3 05/28/2024   CO2 24 05/28/2024   GLUCOSE 163 (H) 05/28/2024   BUN 17 05/28/2024   CREATININE 1.27 05/28/2024   BILITOT 0.4 05/28/2024   ALKPHOS 92 05/28/2024   AST 17 05/28/2024   ALT 16  05/28/2024   PROT 6.6 05/28/2024   ALBUMIN 4.5 05/28/2024   CALCIUM  9.0 05/28/2024   ANIONGAP 10 08/17/2020   EGFR 68 05/28/2024   Lab Results  Component Value Date   CHOL 100 12/24/2023   Lab Results  Component Value Date   HDL 39 (L) 12/24/2023   Lab Results  Component Value Date   LDLCALC 45 12/24/2023   Lab Results  Component Value Date   TRIG 74 12/24/2023   Lab Results  Component Value Date   CHOLHDL 2.6 12/24/2023   Lab Results  Component Value Date   HGBA1C 6.1 (H) 05/28/2024      Assessment & Plan:   Problem List Items Addressed This Visit       Cardiovascular and Mediastinum   Hypertension associated with diabetes (HCC)   BP Readings from Last 1 Encounters:  09/04/24 121/71   Well-controlled On lisinopril  at 5 mg QD considering h/o DM - advised to increase fluid intake to at least 64 ounces in a day Counseled for compliance with the medications Advised DASH diet and moderate exercise/walking, at least 150 mins/week      Relevant Medications   lisinopril  (ZESTRIL ) 5 MG tablet   Semaglutide , 1 MG/DOSE, 4 MG/3ML SOPN   Other Relevant Orders   CMP14+EGFR   CBC with Differential/Platelet   Urine Microalbumin w/creat. ratio     Endocrine   Type 2 diabetes mellitus with other specified complication (HCC)   Lab Results  Component Value Date   HGBA1C 6.1 (H) 05/28/2024   Has h/o uncontrolled DM, was 10.4 in 12/23 Well-controlled with Ozempic  1 mg qw Associated with HTN and HLD Did not tolerate metformin  - had GI side effects (diarrhea) Used to be on insulin  in the past Advised to follow diabetic diet On ARB and statin F/u CMP and lipid panel Diabetic eye exam: Advised to follow up with Ophthalmology for diabetic eye exam      Relevant Medications   lisinopril  (ZESTRIL ) 5 MG tablet   Semaglutide , 1 MG/DOSE, 4 MG/3ML SOPN   Other Relevant Orders   Hemoglobin A1c   CMP14+EGFR     Nervous and Auditory   Cervical radiculopathy   Neck pain  with radicular symptoms likely due to cervical radiculopathy Checked x-ray of cervical spine, planned to get MRI of cervical spine -has been evaluated by Physiatry clinic now, but needs follow-up Avoid oral steroids due to DM Celebrex  as needed for pain On gabapentin  300 mg nightly due to neuropathic symptoms Flexeril  as needed for muscle spasms Heating pad and/or massage PRN      Relevant Medications   gabapentin  (NEURONTIN ) 300 MG capsule   celecoxib  (CELEBREX ) 200 MG capsule     Other   Encounter for general adult medical  examination with abnormal findings - Primary   Physical exam as documented. Counseling done  re healthy lifestyle involving commitment to 150 minutes exercise per week, heart healthy diet, and attaining healthy weight.The importance of adequate sleep also discussed. Immunization and cancer screening needs are specifically addressed at this visit.      Mixed hyperlipidemia   Check lipid profile On Crestor       Relevant Medications   lisinopril  (ZESTRIL ) 5 MG tablet   Other Relevant Orders   Lipid panel   Moderate episode of recurrent major depressive disorder (HCC)   Uncontrolled with Zoloft  100 mg QD, but his school related stress and his recent break-up also contributing to anxiety Symptoms slightly improved since he has graduated from his HVAC course now If persistent worsening of depression, will try to increase dose of Zoloft  Has Klonopin  as needed for panic episode      Relevant Orders   TSH   GAD (generalized anxiety disorder)   Uncontrolled due to recent break-up with his girlfriend, but slowly improving On Zoloft  100 mg QD, continue for now Continue clonazepam  1 mg QD as needed for panic episode Had added Vistaril  25 mg every 8 hours as needed for anxiety in the last visit      Relevant Orders   TSH   Prostate cancer screening   Ordered PSA after discussing its limitations for prostate cancer screening, including false positive results  leading to additional investigations.      Relevant Orders   PSA   Other Visit Diagnoses       Vitamin D  deficiency       Relevant Orders   VITAMIN D  25 Hydroxy (Vit-D Deficiency, Fractures)        Meds ordered this encounter  Medications   gabapentin  (NEURONTIN ) 300 MG capsule    Sig: Take 1 capsule (300 mg total) by mouth at bedtime.    Dispense:  90 capsule    Refill:  0   celecoxib  (CELEBREX ) 200 MG capsule    Sig: Take 1 capsule (200 mg total) by mouth 2 (two) times daily.    Dispense:  60 capsule    Refill:  2   lisinopril  (ZESTRIL ) 5 MG tablet    Sig: Take 1 tablet (5 mg total) by mouth daily.    Dispense:  90 tablet    Refill:  3   Semaglutide , 1 MG/DOSE, 4 MG/3ML SOPN    Sig: Inject 1 mg as directed once a week.    Dispense:  3 mL    Refill:  3    Follow-up: Return in about 3 months (around 12/03/2024) for DM and HTN.    Suzzane MARLA Blanch, MD "

## 2024-09-04 NOTE — Patient Instructions (Signed)
 Please continue to take medications as prescribed.  Please continue to follow low carb diet and perform moderate exercise/walking at least 150 mins/week.

## 2024-09-04 NOTE — Assessment & Plan Note (Addendum)
 Uncontrolled due to recent break-up with his girlfriend, but slowly improving On Zoloft  100 mg QD, continue for now Continue clonazepam  1 mg QD as needed for panic episode Had added Vistaril  25 mg every 8 hours as needed for anxiety in the last visit

## 2024-09-04 NOTE — Assessment & Plan Note (Signed)
 BP Readings from Last 1 Encounters:  09/04/24 121/71   Well-controlled On lisinopril  at 5 mg QD considering h/o DM - advised to increase fluid intake to at least 64 ounces in a day Counseled for compliance with the medications Advised DASH diet and moderate exercise/walking, at least 150 mins/week

## 2024-09-04 NOTE — Assessment & Plan Note (Signed)
 Physical exam as documented. Counseling done  re healthy lifestyle involving commitment to 150 minutes exercise per week, heart healthy diet, and attaining healthy weight.The importance of adequate sleep also discussed. Immunization and cancer screening needs are specifically addressed at this visit.

## 2024-09-04 NOTE — Assessment & Plan Note (Signed)
 Ordered PSA after discussing its limitations for prostate cancer screening, including false positive results leading to additional investigations.

## 2024-09-04 NOTE — Assessment & Plan Note (Signed)
 Lab Results  Component Value Date   HGBA1C 6.1 (H) 05/28/2024   Has h/o uncontrolled DM, was 10.4 in 12/23 Well-controlled with Ozempic  1 mg qw Associated with HTN and HLD Did not tolerate metformin  - had GI side effects (diarrhea) Used to be on insulin  in the past Advised to follow diabetic diet On ARB and statin F/u CMP and lipid panel Diabetic eye exam: Advised to follow up with Ophthalmology for diabetic eye exam

## 2024-09-04 NOTE — Assessment & Plan Note (Addendum)
Check lipid profile On Crestor

## 2024-09-05 ENCOUNTER — Ambulatory Visit: Payer: Self-pay | Admitting: Internal Medicine

## 2024-09-06 LAB — LIPID PANEL
Chol/HDL Ratio: 2.5 ratio (ref 0.0–5.0)
Cholesterol, Total: 113 mg/dL (ref 100–199)
HDL: 45 mg/dL
LDL Chol Calc (NIH): 48 mg/dL (ref 0–99)
Triglycerides: 106 mg/dL (ref 0–149)
VLDL Cholesterol Cal: 20 mg/dL (ref 5–40)

## 2024-09-06 LAB — CMP14+EGFR
ALT: 17 IU/L (ref 0–44)
AST: 23 IU/L (ref 0–40)
Albumin: 4.5 g/dL (ref 3.8–4.9)
Alkaline Phosphatase: 67 IU/L (ref 47–123)
BUN/Creatinine Ratio: 13 (ref 9–20)
BUN: 20 mg/dL (ref 6–24)
Bilirubin Total: 0.5 mg/dL (ref 0.0–1.2)
CO2: 26 mmol/L (ref 20–29)
Calcium: 9 mg/dL (ref 8.7–10.2)
Chloride: 101 mmol/L (ref 96–106)
Creatinine, Ser: 1.54 mg/dL — ABNORMAL HIGH (ref 0.76–1.27)
Globulin, Total: 1.9 g/dL (ref 1.5–4.5)
Glucose: 97 mg/dL (ref 70–99)
Potassium: 4.6 mmol/L (ref 3.5–5.2)
Sodium: 140 mmol/L (ref 134–144)
Total Protein: 6.4 g/dL (ref 6.0–8.5)
eGFR: 54 mL/min/1.73 — ABNORMAL LOW

## 2024-09-06 LAB — CBC WITH DIFFERENTIAL/PLATELET
Basophils Absolute: 0 x10E3/uL (ref 0.0–0.2)
Basos: 1 %
EOS (ABSOLUTE): 0.4 x10E3/uL (ref 0.0–0.4)
Eos: 7 %
Hematocrit: 40.7 % (ref 37.5–51.0)
Hemoglobin: 13.4 g/dL (ref 13.0–17.7)
Immature Grans (Abs): 0 x10E3/uL (ref 0.0–0.1)
Immature Granulocytes: 0 %
Lymphocytes Absolute: 1.5 x10E3/uL (ref 0.7–3.1)
Lymphs: 26 %
MCH: 30.5 pg (ref 26.6–33.0)
MCHC: 32.9 g/dL (ref 31.5–35.7)
MCV: 93 fL (ref 79–97)
Monocytes Absolute: 0.5 x10E3/uL (ref 0.1–0.9)
Monocytes: 8 %
Neutrophils Absolute: 3.5 x10E3/uL (ref 1.4–7.0)
Neutrophils: 58 %
Platelets: 147 x10E3/uL — ABNORMAL LOW (ref 150–450)
RBC: 4.4 x10E6/uL (ref 4.14–5.80)
RDW: 12.4 % (ref 11.6–15.4)
WBC: 5.9 x10E3/uL (ref 3.4–10.8)

## 2024-09-06 LAB — MICROALBUMIN / CREATININE URINE RATIO
Creatinine, Urine: 133.3 mg/dL
Microalb/Creat Ratio: 7 mg/g{creat} (ref 0–29)
Microalbumin, Urine: 9.5 ug/mL

## 2024-09-06 LAB — HEMOGLOBIN A1C
Est. average glucose Bld gHb Est-mCnc: 100 mg/dL
Hgb A1c MFr Bld: 5.1 % (ref 4.8–5.6)

## 2024-09-06 LAB — PSA: Prostate Specific Ag, Serum: 1 ng/mL (ref 0.0–4.0)

## 2024-09-06 LAB — TSH: TSH: 2.25 u[IU]/mL (ref 0.450–4.500)

## 2024-09-06 LAB — VITAMIN D 25 HYDROXY (VIT D DEFICIENCY, FRACTURES): Vit D, 25-Hydroxy: 23.4 ng/mL — ABNORMAL LOW (ref 30.0–100.0)

## 2024-11-05 ENCOUNTER — Ambulatory Visit: Admitting: Internal Medicine

## 2024-12-04 ENCOUNTER — Ambulatory Visit: Payer: Self-pay | Admitting: Internal Medicine
